# Patient Record
Sex: Male | Born: 1950 | Race: White | Hispanic: No | Marital: Married | Smoking: Never smoker
Health system: Southern US, Community
[De-identification: ages and names within clinical notes are randomized; demographics above are authoritative.]

## PROBLEM LIST (undated history)

## (undated) DIAGNOSIS — G4730 Sleep apnea, unspecified: Secondary | ICD-10-CM

## (undated) DIAGNOSIS — K219 Gastro-esophageal reflux disease without esophagitis: Secondary | ICD-10-CM

## (undated) DIAGNOSIS — M1990 Unspecified osteoarthritis, unspecified site: Secondary | ICD-10-CM

## (undated) DIAGNOSIS — E785 Hyperlipidemia, unspecified: Secondary | ICD-10-CM

## (undated) DIAGNOSIS — I2510 Atherosclerotic heart disease of native coronary artery without angina pectoris: Secondary | ICD-10-CM

## (undated) DIAGNOSIS — I10 Essential (primary) hypertension: Secondary | ICD-10-CM

## (undated) DIAGNOSIS — K222 Esophageal obstruction: Secondary | ICD-10-CM

## (undated) DIAGNOSIS — Z85828 Personal history of other malignant neoplasm of skin: Secondary | ICD-10-CM

## (undated) HISTORY — PX: UPPER GI ENDOSCOPY: SHX6162

## (undated) HISTORY — PX: COLONOSCOPY: SHX174

## (undated) HISTORY — PX: APPENDECTOMY: SHX54

## (undated) HISTORY — PX: VASECTOMY: SHX75

---

## 1971-12-24 HISTORY — PX: HERNIA REPAIR: SHX51

## 1983-12-24 HISTORY — PX: FRACTURE SURGERY: SHX138

## 2009-12-23 DIAGNOSIS — I2510 Atherosclerotic heart disease of native coronary artery without angina pectoris: Secondary | ICD-10-CM

## 2009-12-23 HISTORY — DX: Atherosclerotic heart disease of native coronary artery without angina pectoris: I25.10

## 2009-12-23 HISTORY — PX: CORONARY ANGIOPLASTY WITH STENT PLACEMENT: SHX49

## 2013-07-17 ENCOUNTER — Ambulatory Visit (INDEPENDENT_AMBULATORY_CARE_PROVIDER_SITE_OTHER): Payer: 59 | Admitting: Family Medicine

## 2013-07-17 VITALS — BP 110/75 | HR 96 | Temp 98.3°F | Resp 18 | Ht 73.0 in | Wt 197.0 lb

## 2013-07-17 DIAGNOSIS — M545 Low back pain: Secondary | ICD-10-CM

## 2013-07-17 DIAGNOSIS — R109 Unspecified abdominal pain: Secondary | ICD-10-CM

## 2013-07-17 DIAGNOSIS — R197 Diarrhea, unspecified: Secondary | ICD-10-CM

## 2013-07-17 LAB — POCT CBC
Granulocyte percent: 62.1 %G (ref 37–80)
HCT, POC: 50 % (ref 43.5–53.7)
Hemoglobin: 16.3 g/dL (ref 14.1–18.1)
POC Granulocyte: 4.2 (ref 2–6.9)
RBC: 5.27 M/uL (ref 4.69–6.13)

## 2013-07-17 LAB — POCT URINALYSIS DIPSTICK
Bilirubin, UA: NEGATIVE
Glucose, UA: NEGATIVE
Ketones, UA: NEGATIVE
Leukocytes, UA: NEGATIVE
Protein, UA: NEGATIVE

## 2013-07-17 MED ORDER — HYOSCYAMINE SULFATE ER 0.375 MG PO TB12: 0.3750 mg | ORAL_TABLET | Freq: Two times a day (BID) | ORAL | Status: DC | PRN

## 2013-07-17 MED ORDER — MELOXICAM 15 MG PO TABS: 15.0000 mg | ORAL_TABLET | Freq: Every day | ORAL | Status: DC

## 2013-07-17 NOTE — Patient Instructions (Addendum)
1.  CALL ON Monday IF NO IMPROVEMENT [phone]. 2.  TAKE IMODIUM ONE TABLET TWICE DAILY ONLY TO SLOW DOWN DIARRHEA.   Diarrhea Diarrhea is frequent loose and watery bowel movements. It can cause you to feel weak and dehydrated. Dehydration can cause you to become tired and thirsty, have a dry mouth, and have decreased urination that often is dark yellow. Diarrhea is a sign of another problem, most often an infection that will not last long. In most cases, diarrhea typically lasts 2 3 days. However, it can last longer if it is a sign of something more serious. It is important to treat your diarrhea as directed by your caregive to lessen or prevent future episodes of diarrhea. CAUSES  Some common causes include:  Gastrointestinal infections caused by viruses, bacteria, or parasites.  Food poisoning or food allergies.  Certain medicines, such as antibiotics, chemotherapy, and laxatives.  Artificial sweeteners and fructose.  Digestive disorders. HOME CARE INSTRUCTIONS  Ensure adequate fluid intake (hydration): have 1 cup (8 oz) of fluid for each diarrhea episode. Avoid fluids that contain simple sugars or sports drinks, fruit juices, whole milk products, and sodas. Your urine should be clear or pale yellow if you are drinking enough fluids. Hydrate with an oral rehydration solution that you can purchase at pharmacies, retail stores, and online. You can prepare an oral rehydration solution at home by mixing the following ingredients together:    tsp table salt.   tsp baking soda.   tsp salt substitute containing potassium chloride.  1  tablespoons sugar.  1 L (34 oz) of water.  Certain foods and beverages may increase the speed at which food moves through the gastrointestinal (GI) tract. These foods and beverages should be avoided and include:  Caffeinated and alcoholic beverages.  High-fiber foods, such as raw fruits and vegetables, nuts, seeds, and whole grain breads and  cereals.  Foods and beverages sweetened with sugar alcohols, such as xylitol, sorbitol, and mannitol.  Some foods may be well tolerated and may help thicken stool including:  Starchy foods, such as rice, toast, pasta, low-sugar cereal, oatmeal, grits, baked potatoes, crackers, and bagels.  Bananas.  Applesauce.  Add probiotic-rich foods to help increase healthy bacteria in the GI tract, such as yogurt and fermented milk products.  Wash your hands well after each diarrhea episode.  Only take over-the-counter or prescription medicines as directed by your caregiver.  Take a warm bath to relieve any burning or pain from frequent diarrhea episodes. SEEK IMMEDIATE MEDICAL CARE IF:   You are unable to keep fluids down.  You have persistent vomiting.  You have blood in your stool, or your stools are black and tarry.  You do not urinate in 6 8 hours, or there is only a small amount of very dark urine.  You have abdominal pain that increases or localizes.  You have weakness, dizziness, confusion, or lightheadedness.  You have a severe headache.  Your diarrhea gets worse or does not get better.  You have a fever or persistent symptoms for more than 2 3 days.  You have a fever and your symptoms suddenly get worse. MAKE SURE YOU:   Understand these instructions.  Will watch your condition.  Will get help right away if you are not doing well or get worse. Document Released: 11/29/2002 Document Revised: 11/25/2012 Document Reviewed: 08/16/2012 [facility] Patient Information 2014 [place], [place].

## 2013-07-17 NOTE — Progress Notes (Signed)
Subjective:    Patient ID: Jacob Parker, male    DOB: 12-08-1951, 62 y.o.   MRN: [id number]  HPI This 62 y.o. male presents for evaluation of diarrhea.   +fever Tmax unknown; +sweats; +chills.  HA; +diarrhea; +nausea; no vomiting.  Diarrhea small amounts 7-8; green stools per day; no melena; no bloody stools. + Watery.  No mucous in stools.  Cramping; no abdominal pain.  Friend got sick on trip before patient developed diarrhea.  +HA.Onset four days ago.  Drinking water; vitamin water, cranberry juice.  Soup yesterday; this morning, ate waffle.  Small volumes. Watermelon today.  Took one Imodium.  Tylenol for fever.  [place]  [place].  2. Lower back pain: chronic issue with recent worsening; no radiation into legs; no n/t/w; no saddle paresthesias; no b/b dysfunction.  No previous xrays; not interested in xrays today.   Review of Systems  Constitutional: Positive for chills, diaphoresis and fatigue. Negative for fever.  Gastrointestinal: Positive for nausea, abdominal pain and diarrhea. Negative for vomiting, constipation, blood in stool, abdominal distention, anal bleeding and rectal pain.  Musculoskeletal: Positive for myalgias and back pain.  Skin: Negative for rash.  Neurological: Positive for headaches. Negative for dizziness and light-headedness.   History reviewed. No pertinent past medical history. Past Surgical History  Procedure Laterality Date  . Appendectomy    . Fracture surgery    . Vasectomy     History   Social History  . Marital Status: Married    Spouse Name: N/A    Number of Children: N/A  . Years of Education: N/A   Occupational History  . Not on file.   Social History Main Topics  . Smoking status: Never Smoker   . Smokeless tobacco: Not on file  . Alcohol Use: 1.5 oz/week    3 drink(s) per week  . Drug Use: No  . Sexually Active: Yes   Other Topics Concern  . Not on file   Social History Narrative  . No narrative on file   No current  outpatient prescriptions on file prior to visit.   No current facility-administered medications on file prior to visit.       Objective:   Physical Exam  Constitutional: He is oriented to person, place, and time. He appears well-developed and well-nourished. No distress.  HENT:  Head: Normocephalic and atraumatic.  Mouth/Throat: Oropharynx is clear and moist.  Eyes: Conjunctivae are normal. Pupils are equal, round, and reactive to light.  Neck: Normal range of motion. Neck supple. No thyromegaly present.  Cardiovascular: Normal rate, regular rhythm and normal heart sounds.   Pulmonary/Chest: Effort normal and breath sounds normal.  Abdominal: Soft. Bowel sounds are normal. He exhibits no distension and no mass. There is no tenderness. There is no rebound and no guarding.  Musculoskeletal:       Lumbar back: He exhibits decreased range of motion, pain and spasm. He exhibits no tenderness.  Lymphadenopathy:    He has no cervical adenopathy.  Neurological: He is alert and oriented to person, place, and time.  Skin: Skin is warm and dry. No rash noted. He is not diaphoretic.  Psychiatric: He has a normal mood and affect. His behavior is normal.      Results for orders placed in visit on 07/17/13  POCT CBC      Result Value Range   WBC 6.8  4.6 - 10.2 K/uL   Lymph, poc 1.8  0.6 - 3.4   POC LYMPH PERCENT 26.3  10 - 50 %L   MID (cbc) 0.8  0 - 0.9   POC MID % 11.6  0 - 12 %M   POC Granulocyte 4.2  2 - 6.9   Granulocyte percent 62.1  37 - 80 %G   RBC 5.27  4.69 - 6.13 M/uL   Hemoglobin 16.3  14.1 - 18.1 g/dL   HCT, POC 16.1  09.6 - 53.7 %   MCV 94.9  80 - 97 fL   MCH, POC 30.9  27 - 31.2 pg   MCHC 32.6  31.8 - 35.4 g/dL   RDW, POC 04.5     Platelet Count, POC 283  142 - 424 K/uL   MPV 9.2  0 - 99.8 fL  POCT URINALYSIS DIPSTICK      Result Value Range   Color, UA yellow     Clarity, UA clear     Glucose, UA neg     Bilirubin, UA neg     Ketones, UA neg     Spec Grav, UA  1.015     Blood, UA neg     pH, UA 5.5     Protein, UA neg     Urobilinogen, UA 0.2     Nitrite, UA neg     Leukocytes, UA Negative      Assessment & Plan:  Diarrhea - Plan: POCT CBC, POCT urinalysis dipstick, Comprehensive metabolic panel  Abdominal  pain, other specified site - Plan: POCT CBC, POCT urinalysis dipstick, Comprehensive metabolic panel  Low back pain - Plan: POCT CBC, POCT urinalysis dipstick, Comprehensive metabolic panel   1. Diarrheal illness;  New.  BRAT diet, hydration.  RTC if diarrhea persists > 7 days; will warrant stool studies. Recommend imodium bid PRN. 2. Abdominal cramping:  New.  rx for Hyoscyamine provided for PRN use. 3. Low back pain: chronic issue; rx for Meloxicam provided.  Warrants xrays lumbar spine once diarrheal illness has resolved.  Meds ordered this encounter  Medications  . rosuvastatin (CRESTOR) 20 MG tablet    Sig: Take 20 mg by mouth daily.  Marland Kitchen lisinopril (PRINIVIL,ZESTRIL) 40 MG tablet    Sig: Take 40 mg by mouth daily.  Marland Kitchen esomeprazole (NEXIUM) 20 MG capsule    Sig: Take 20 mg by mouth daily before breakfast.  . aspirin 81 MG tablet    Sig: Take 81 mg by mouth daily.  . hyoscyamine (LEVBID) 0.375 MG 12 hr tablet    Sig: Take 1 tablet (0.375 mg total) by mouth every 12 (twelve) hours as needed for cramping.    Dispense:  30 tablet    Refill:  0  . meloxicam (MOBIC) 15 MG tablet    Sig: Take 1 tablet (15 mg total) by mouth daily.    Dispense:  30 tablet    Refill:  0

## 2013-07-18 LAB — COMPREHENSIVE METABOLIC PANEL
ALT: 45 U/L (ref 0–53)
CO2: 28 mEq/L (ref 19–32)
Chloride: 99 mEq/L (ref 96–112)
Sodium: 135 mEq/L (ref 135–145)
Total Bilirubin: 0.6 mg/dL (ref 0.3–1.2)
Total Protein: 7.6 g/dL (ref 6.0–8.3)

## 2014-10-27 ENCOUNTER — Other Ambulatory Visit: Payer: Self-pay | Admitting: Orthopedic Surgery

## 2014-11-04 ENCOUNTER — Other Ambulatory Visit (HOSPITAL_COMMUNITY): Payer: Self-pay | Admitting: *Deleted

## 2014-11-04 NOTE — Patient Instructions (Addendum)
Jacob Parker  11/04/2014                           YOUR PROCEDURE IS SCHEDULED ON:  11/16/14                ENTER FROM [address] - ENTER THRU EMERGENCY ENTRANCE                                             FOLLOW  SIGNS TO SHORT STAY CENTER                 ARRIVE AT SHORT STAY AT:  5:30 AM               CALL THIS NUMBER IF ANY PROBLEMS THE DAY OF SURGERY :               [phone]                                REMEMBER:   Do not eat food or drink liquids AFTER MIDNIGHT                  Take these medicines the morning of surgery with               A SIPS OF WATER :  NEXIUM / BYSTOLIC / ASPIRIN 81 MG (PER ORDER DR. TILLEY)       Do not wear jewelry, make-up   Do not wear lotions, powders, or perfumes.   Do not shave legs or underarms 12 hrs. before surgery (men may shave face)  Do not bring valuables to the hospital.  Contacts, dentures or bridgework may not be worn into surgery.  Leave suitcase in the car. After surgery it may be brought to your room.  For patients admitted to the hospital more than one night, checkout time is            11:00 AM                                                         ________________________________________________________________________                                                                                                  Jacob Parker  Before surgery, you can play an important role.  Because skin is not sterile, your skin needs to be as free of germs as possible.  You can reduce the number of germs on your skin by washing with CHG (chlorahexidine gluconate) soap before surgery.  CHG is an antiseptic cleaner which kills germs and bonds with the skin to continue killing germs even after washing. Please  DO NOT use if you have an allergy to CHG or antibacterial soaps.  If your skin becomes reddened/irritated stop using the CHG and inform your nurse when you arrive at Short Stay. Do not  shave (including legs and underarms) for at least 48 hours prior to the first CHG shower.  You may shave your face. Please follow these instructions carefully:   1.  Shower with CHG Soap the night before surgery and the  morning of Surgery.   2.  If you choose to wash your hair, wash your hair first as usual with your  normal  Shampoo.   3.  After you shampoo, rinse your hair and body thoroughly to remove the  shampoo.                                         4.  Use CHG as you would any other liquid soap.  You can apply chg directly  to the skin and wash . Gently wash with scrungie or clean wascloth    5.  Apply the CHG Soap to your body ONLY FROM THE NECK DOWN.   Do not use on open                           Wound or open sores. Avoid contact with eyes, ears mouth and genitals (private parts).                        Genitals (private parts) with your normal soap.              6.  Wash thoroughly, paying special attention to the area where your surgery  will be performed.   7.  Thoroughly rinse your body with warm water from the neck down.   8.  DO NOT shower/wash with your normal soap after using and rinsing off  the CHG Soap .                9.  Pat yourself dry with a clean towel.             10.  Wear clean pajamas.             11.  Place clean sheets on your bed the night of your first shower and do not  sleep with pets.  Day of Surgery : Do not apply any lotions/deodorants the morning of surgery.  Please wear clean clothes to the hospital/surgery center.  FAILURE TO FOLLOW THESE INSTRUCTIONS MAY RESULT IN THE CANCELLATION OF YOUR SURGERY    PATIENT SIGNATURE_________________________________  ______________________________________________________________________     Adam Phenix  An incentive spirometer is a tool that can help keep your lungs clear and active. This tool measures how well you are filling your lungs with each breath. Taking long deep breaths may  help reverse or decrease the chance of developing breathing (pulmonary) problems (especially infection) following:  A long period of time when you are unable to move or be active. BEFORE THE PROCEDURE   If the spirometer includes an indicator to show your best effort, your nurse or respiratory therapist will set it to a desired goal.  If possible, sit up straight or lean slightly forward. Try not to slouch.  Hold the incentive spirometer in an upright position. INSTRUCTIONS FOR USE  1. Sit on the  edge of your bed if possible, or sit up as far as you can in bed or on a chair. 2. Hold the incentive spirometer in an upright position. 3. Breathe out normally. 4. Place the mouthpiece in your mouth and seal your lips tightly around it. 5. Breathe in slowly and as deeply as possible, raising the piston or the ball toward the top of the column. 6. Hold your breath for 3-5 seconds or for as long as possible. Allow the piston or ball to fall to the bottom of the column. 7. Remove the mouthpiece from your mouth and breathe out normally. 8. Rest for a few seconds and repeat Steps 1 through 7 at least 10 times every 1-2 hours when you are awake. Take your time and take a few normal breaths between deep breaths. 9. The spirometer may include an indicator to show your best effort. Use the indicator as a goal to work toward during each repetition. 10. After each set of 10 deep breaths, practice coughing to be sure your lungs are clear. If you have an incision (the cut made at the time of surgery), support your incision when coughing by placing a pillow or rolled up towels firmly against it. Once you are able to get out of bed, walk around indoors and cough well. You may stop using the incentive spirometer when instructed by your caregiver.  RISKS AND COMPLICATIONS  Take your time so you do not get dizzy or light-headed.  If you are in pain, you may need to take or ask for pain medication before doing  incentive spirometry. It is harder to take a deep breath if you are having pain. AFTER USE  Rest and breathe slowly and easily.  It can be helpful to keep track of a log of your progress. Your caregiver can provide you with a simple table to help with this. If you are using the spirometer at home, follow these instructions: [place] IF:   You are having difficultly using the spirometer.  You have trouble using the spirometer as often as instructed.  Your pain medication is not giving enough relief while using the spirometer.  You develop fever of 100.5 F (38.1 C) or higher. SEEK IMMEDIATE MEDICAL CARE IF:   You cough up bloody sputum that had not been present before.  You develop fever of 102 F (38.9 C) or greater.  You develop worsening pain at or near the incision site. MAKE SURE YOU:   Understand these instructions.  Will watch your condition.  Will get help right away if you are not doing well or get worse. Document Released: 04/21/2007 Document Revised: 03/02/2012 Document Reviewed: 06/22/2007 [facility] Patient Information 2014 [place], [place].   ________________________________________________________________________

## 2014-11-08 ENCOUNTER — Encounter (HOSPITAL_COMMUNITY): Payer: Self-pay

## 2014-11-08 ENCOUNTER — Encounter (HOSPITAL_COMMUNITY)
Admission: RE | Admit: 2014-11-08 | Discharge: 2014-11-08 | Disposition: A | Discharge status: Home / Self Care | Payer: Medicare HMO | Source: Ambulatory Visit | Attending: Orthopedic Surgery | Admitting: Orthopedic Surgery

## 2014-11-08 ENCOUNTER — Ambulatory Visit (HOSPITAL_COMMUNITY)
Admission: RE | Admit: 2014-11-08 | Discharge: 2014-11-08 | Disposition: A | Discharge status: Home / Self Care | Payer: Medicare HMO | Source: Ambulatory Visit | Attending: Orthopedic Surgery | Admitting: Orthopedic Surgery

## 2014-11-08 DIAGNOSIS — I2510 Atherosclerotic heart disease of native coronary artery without angina pectoris: Secondary | ICD-10-CM | POA: Insufficient documentation

## 2014-11-08 DIAGNOSIS — I700 Atherosclerosis of aorta: Secondary | ICD-10-CM | POA: Insufficient documentation

## 2014-11-08 DIAGNOSIS — Z01811 Encounter for preprocedural respiratory examination: Secondary | ICD-10-CM | POA: Insufficient documentation

## 2014-11-08 DIAGNOSIS — Z01818 Encounter for other preprocedural examination: Secondary | ICD-10-CM

## 2014-11-08 HISTORY — DX: Essential (primary) hypertension: I10

## 2014-11-08 HISTORY — DX: Unspecified osteoarthritis, unspecified site: M19.90

## 2014-11-08 HISTORY — DX: Esophageal obstruction: K22.2

## 2014-11-08 HISTORY — DX: Sleep apnea, unspecified: G47.30

## 2014-11-08 HISTORY — DX: Personal history of other malignant neoplasm of skin: Z85.828

## 2014-11-08 HISTORY — DX: Atherosclerotic heart disease of native coronary artery without angina pectoris: I25.10

## 2014-11-08 HISTORY — DX: Hyperlipidemia, unspecified: E78.5

## 2014-11-08 LAB — COMPREHENSIVE METABOLIC PANEL
ALBUMIN: 3.9 g/dL (ref 3.5–5.2)
ALK PHOS: 91 U/L (ref 39–117)
ALT: 64 U/L — AB (ref 0–53)
ANION GAP: 11 (ref 5–15)
AST: 37 U/L (ref 0–37)
BILIRUBIN TOTAL: 0.7 mg/dL (ref 0.3–1.2)
BUN: 14 mg/dL (ref 6–23)
CHLORIDE: 101 meq/L (ref 96–112)
CO2: 29 mEq/L (ref 19–32)
Calcium: 9.7 mg/dL (ref 8.4–10.5)
Creatinine, Ser: 1.02 mg/dL (ref 0.50–1.35)
GFR calc Af Amer: 87 mL/min — ABNORMAL LOW (ref >90)
GFR calc non Af Amer: 75 mL/min — ABNORMAL LOW (ref >90)
Glucose, Bld: 93 mg/dL (ref 70–99)
POTASSIUM: 4.9 meq/L (ref 3.7–5.3)
SODIUM: 141 meq/L (ref 137–147)
TOTAL PROTEIN: 7.4 g/dL (ref 6.0–8.3)

## 2014-11-08 LAB — CBC WITH DIFFERENTIAL/PLATELET
BASOS PCT: 1 % (ref 0–1)
Basophils Absolute: 0.1 10*3/uL (ref 0.0–0.1)
Eosinophils Absolute: 0.5 10*3/uL (ref 0.0–0.7)
Eosinophils Relative: 8 % — ABNORMAL HIGH (ref 0–5)
HCT: 44 % (ref 39.0–52.0)
Hemoglobin: 14.8 g/dL (ref 13.0–17.0)
Lymphocytes Relative: 31 % (ref 12–46)
Lymphs Abs: 1.7 10*3/uL (ref 0.7–4.0)
MCH: 30.1 pg (ref 26.0–34.0)
MCHC: 33.6 g/dL (ref 30.0–36.0)
MCV: 89.4 fL (ref 78.0–100.0)
Monocytes Absolute: 0.5 10*3/uL (ref 0.1–1.0)
Monocytes Relative: 9 % (ref 3–12)
NEUTROS PCT: 51 % (ref 43–77)
Neutro Abs: 2.8 10*3/uL (ref 1.7–7.7)
PLATELETS: 202 10*3/uL (ref 150–400)
RBC: 4.92 MIL/uL (ref 4.22–5.81)
RDW: 12.8 % (ref 11.5–15.5)
WBC: 5.5 10*3/uL (ref 4.0–10.5)

## 2014-11-08 LAB — URINALYSIS, ROUTINE W REFLEX MICROSCOPIC
Bilirubin Urine: NEGATIVE
Glucose, UA: NEGATIVE mg/dL
Hgb urine dipstick: NEGATIVE
Ketones, ur: NEGATIVE mg/dL
LEUKOCYTES UA: NEGATIVE
NITRITE: NEGATIVE
Protein, ur: NEGATIVE mg/dL
SPECIFIC GRAVITY, URINE: 1.023 (ref 1.005–1.030)
Urobilinogen, UA: 0.2 mg/dL (ref 0.0–1.0)
pH: 6 (ref 5.0–8.0)

## 2014-11-08 LAB — SURGICAL PCR SCREEN
MRSA, PCR: INVALID — AB
Staphylococcus aureus: INVALID — AB

## 2014-11-08 LAB — ABO/RH: ABO/RH(D): A NEG

## 2014-11-08 LAB — PROTIME-INR
INR: 0.98 (ref 0.00–1.49)
Prothrombin Time: 13.1 seconds (ref 11.6–15.2)

## 2014-11-08 LAB — APTT: APTT: 28 s (ref 24–37)

## 2014-11-10 LAB — MRSA CULTURE

## 2014-11-15 NOTE — Anesthesia Preprocedure Evaluation (Signed)
Anesthesia Evaluation  Patient identified by MRN, date of birth, ID band Patient awake    Reviewed: Allergy & Precautions, H&P , NPO status , Patient's Chart, lab work & pertinent test results, reviewed documented beta blocker date and time   Airway Mallampati: II  TM Distance: >3 FB Neck ROM: Full    Dental no notable dental hx.    Pulmonary neg pulmonary ROS,  breath sounds clear to auscultation  Pulmonary exam normal       Cardiovascular hypertension, Pt. on medications and Pt. on home beta blockers + CAD and + Cardiac Stents Rhythm:Regular Rate:Normal     Neuro/Psych negative neurological ROS  negative psych ROS   GI/Hepatic negative GI ROS, Neg liver ROS,   Endo/Other  negative endocrine ROS  Renal/GU negative Renal ROS     Musculoskeletal  (+) Arthritis -,   Abdominal   Peds  Hematology negative hematology ROS (+)   Anesthesia Other Findings   Reproductive/Obstetrics negative OB ROS                             Anesthesia Physical Anesthesia Plan  ASA: III  Anesthesia Plan: Spinal   Post-op Pain Management:    Induction:   Airway Management Planned:   Additional Equipment: None  Intra-op Plan:   Post-operative Plan:   Informed Consent: I have reviewed the patients History and Physical, chart, labs and discussed the procedure including the risks, benefits and alternatives for the proposed anesthesia with the patient or authorized representative who has indicated his/her understanding and acceptance.   Dental advisory given  Plan Discussed with: CRNA  Anesthesia Plan Comments:         Anesthesia Quick Evaluation

## 2014-11-16 ENCOUNTER — Encounter (HOSPITAL_COMMUNITY)
Admission: RE | Disposition: A | Discharge status: Skilled Nursing Facility | Payer: Self-pay | Source: Ambulatory Visit | Attending: Orthopedic Surgery

## 2014-11-16 ENCOUNTER — Inpatient Hospital Stay (HOSPITAL_COMMUNITY)
Admission: RE | Admit: 2014-11-16 | Discharge: 2014-11-18 | DRG: 470 | Disposition: A | Discharge status: Skilled Nursing Facility | Payer: Medicare HMO | Source: Ambulatory Visit | Attending: Orthopedic Surgery | Admitting: Orthopedic Surgery

## 2014-11-16 ENCOUNTER — Encounter (HOSPITAL_COMMUNITY): Payer: Self-pay | Admitting: *Deleted

## 2014-11-16 ENCOUNTER — Inpatient Hospital Stay (HOSPITAL_COMMUNITY): Payer: Medicare HMO | Admitting: Anesthesiology

## 2014-11-16 DIAGNOSIS — M1712 Unilateral primary osteoarthritis, left knee: Secondary | ICD-10-CM

## 2014-11-16 DIAGNOSIS — Z85828 Personal history of other malignant neoplasm of skin: Secondary | ICD-10-CM

## 2014-11-16 DIAGNOSIS — Z7982 Long term (current) use of aspirin: Secondary | ICD-10-CM

## 2014-11-16 DIAGNOSIS — Z79899 Other long term (current) drug therapy: Secondary | ICD-10-CM

## 2014-11-16 DIAGNOSIS — E785 Hyperlipidemia, unspecified: Secondary | ICD-10-CM | POA: Diagnosis present

## 2014-11-16 DIAGNOSIS — Z955 Presence of coronary angioplasty implant and graft: Secondary | ICD-10-CM | POA: Diagnosis not present

## 2014-11-16 DIAGNOSIS — I2510 Atherosclerotic heart disease of native coronary artery without angina pectoris: Secondary | ICD-10-CM | POA: Diagnosis present

## 2014-11-16 DIAGNOSIS — I10 Essential (primary) hypertension: Secondary | ICD-10-CM | POA: Diagnosis present

## 2014-11-16 DIAGNOSIS — M25562 Pain in left knee: Secondary | ICD-10-CM | POA: Diagnosis present

## 2014-11-16 DIAGNOSIS — G4730 Sleep apnea, unspecified: Secondary | ICD-10-CM | POA: Diagnosis present

## 2014-11-16 HISTORY — PX: TOTAL KNEE ARTHROPLASTY: SHX125

## 2014-11-16 HISTORY — DX: Unilateral primary osteoarthritis, left knee: M17.12

## 2014-11-16 LAB — TYPE AND SCREEN
ABO/RH(D): A NEG
Antibody Screen: NEGATIVE

## 2014-11-16 SURGERY — ARTHROPLASTY, KNEE, TOTAL
Anesthesia: Spinal | Site: Knee | Laterality: Left

## 2014-11-16 MED ORDER — PROPOFOL 10 MG/ML IV BOLUS
INTRAVENOUS | Status: AC
  Filled 2014-11-16: qty 20

## 2014-11-16 MED ORDER — HYDROMORPHONE HCL 1 MG/ML IJ SOLN
0.2500 mg | INTRAMUSCULAR | Status: DC | PRN
  Administered 2014-11-16 (×4): 0.5 mg via INTRAVENOUS

## 2014-11-16 MED ORDER — STERILE WATER FOR IRRIGATION IR SOLN
Status: DC | PRN
  Administered 2014-11-16: 1500 mL

## 2014-11-16 MED ORDER — MIDAZOLAM HCL 2 MG/2ML IJ SOLN
INTRAMUSCULAR | Status: AC
  Filled 2014-11-16: qty 2

## 2014-11-16 MED ORDER — LIDOCAINE HCL (CARDIAC) 20 MG/ML IV SOLN
INTRAVENOUS | Status: DC | PRN
  Administered 2014-11-16: 50 mg via INTRAVENOUS

## 2014-11-16 MED ORDER — CEFAZOLIN SODIUM-DEXTROSE 2-3 GM-% IV SOLR
2.0000 g | Freq: Four times a day (QID) | INTRAVENOUS | Status: AC
  Administered 2014-11-16 (×2): 2 g via INTRAVENOUS
  Filled 2014-11-16 (×2): qty 50

## 2014-11-16 MED ORDER — PROMETHAZINE HCL 25 MG/ML IJ SOLN
6.2500 mg | Freq: Four times a day (QID) | INTRAMUSCULAR | Status: DC | PRN
  Administered 2014-11-16 – 2014-11-17 (×2): 6.25 mg via INTRAVENOUS
  Filled 2014-11-16 (×2): qty 1

## 2014-11-16 MED ORDER — METHOCARBAMOL 500 MG PO TABS: 500.0000 mg | ORAL_TABLET | Freq: Three times a day (TID) | ORAL | Status: DC | PRN

## 2014-11-16 MED ORDER — ASPIRIN EC 325 MG PO TBEC: 325.0000 mg | DELAYED_RELEASE_TABLET | Freq: Two times a day (BID) | ORAL | Status: DC

## 2014-11-16 MED ORDER — HYDROMORPHONE HCL 1 MG/ML IJ SOLN
INTRAMUSCULAR | Status: AC
  Administered 2014-11-16: 1 mg via INTRAVENOUS
  Filled 2014-11-16: qty 1

## 2014-11-16 MED ORDER — ONDANSETRON HCL 4 MG/2ML IJ SOLN
INTRAMUSCULAR | Status: DC | PRN
  Administered 2014-11-16: 4 mg via INTRAVENOUS

## 2014-11-16 MED ORDER — LIDOCAINE HCL (CARDIAC) 20 MG/ML IV SOLN
INTRAVENOUS | Status: AC
  Filled 2014-11-16: qty 5

## 2014-11-16 MED ORDER — PANTOPRAZOLE SODIUM 40 MG PO TBEC
80.0000 mg | DELAYED_RELEASE_TABLET | Freq: Every day | ORAL | Status: DC
  Administered 2014-11-17 – 2014-11-18 (×2): 80 mg via ORAL
  Filled 2014-11-16 (×2): qty 2

## 2014-11-16 MED ORDER — ASPIRIN EC 325 MG PO TBEC
325.0000 mg | DELAYED_RELEASE_TABLET | Freq: Two times a day (BID) | ORAL | Status: DC
  Administered 2014-11-16 – 2014-11-18 (×4): 325 mg via ORAL
  Filled 2014-11-16 (×7): qty 1

## 2014-11-16 MED ORDER — ONDANSETRON HCL 4 MG PO TABS: 4.0000 mg | ORAL_TABLET | Freq: Four times a day (QID) | ORAL | Status: DC | PRN

## 2014-11-16 MED ORDER — PROPOFOL INFUSION 10 MG/ML OPTIME
INTRAVENOUS | Status: DC | PRN
  Administered 2014-11-16: 140 ug/kg/min via INTRAVENOUS

## 2014-11-16 MED ORDER — OXYCODONE HCL 5 MG/5ML PO SOLN
5.0000 mg | Freq: Once | ORAL | Status: AC | PRN
  Filled 2014-11-16: qty 5

## 2014-11-16 MED ORDER — PROPOFOL 10 MG/ML IV BOLUS
INTRAVENOUS | Status: DC | PRN
  Administered 2014-11-16: 10 mg via INTRAVENOUS
  Administered 2014-11-16: 20 mg via INTRAVENOUS

## 2014-11-16 MED ORDER — ROSUVASTATIN CALCIUM 40 MG PO TABS
40.0000 mg | ORAL_TABLET | Freq: Every evening | ORAL | Status: DC
  Administered 2014-11-16 – 2014-11-17 (×2): 40 mg via ORAL
  Filled 2014-11-16 (×3): qty 1

## 2014-11-16 MED ORDER — PHENYLEPHRINE 40 MCG/ML (10ML) SYRINGE FOR IV PUSH (FOR BLOOD PRESSURE SUPPORT)
PREFILLED_SYRINGE | INTRAVENOUS | Status: AC
  Filled 2014-11-16: qty 10

## 2014-11-16 MED ORDER — BUPIVACAINE IN DEXTROSE 0.75-8.25 % IT SOLN
INTRATHECAL | Status: DC | PRN
  Administered 2014-11-16: 15 mg via INTRATHECAL

## 2014-11-16 MED ORDER — LACTATED RINGERS IV SOLN
INTRAVENOUS | Status: DC | PRN
  Administered 2014-11-16 (×3): via INTRAVENOUS

## 2014-11-16 MED ORDER — ZOLPIDEM TARTRATE 5 MG PO TABS
5.0000 mg | ORAL_TABLET | Freq: Every evening | ORAL | Status: DC | PRN
  Administered 2014-11-17 – 2014-11-18 (×2): 5 mg via ORAL
  Filled 2014-11-16 (×2): qty 1

## 2014-11-16 MED ORDER — ONDANSETRON HCL 4 MG/2ML IJ SOLN
4.0000 mg | Freq: Four times a day (QID) | INTRAMUSCULAR | Status: DC | PRN
  Administered 2014-11-16 – 2014-11-17 (×2): 4 mg via INTRAVENOUS
  Filled 2014-11-16 (×2): qty 2

## 2014-11-16 MED ORDER — KETAMINE HCL 10 MG/ML IJ SOLN
INTRAMUSCULAR | Status: AC
  Filled 2014-11-16: qty 1

## 2014-11-16 MED ORDER — FLEET ENEMA 7-19 GM/118ML RE ENEM: 1.0000 | ENEMA | Freq: Once | RECTAL | Status: AC | PRN

## 2014-11-16 MED ORDER — BUPIVACAINE-EPINEPHRINE 0.5% -1:200000 IJ SOLN
INTRAMUSCULAR | Status: DC | PRN
  Administered 2014-11-16: 20 mL

## 2014-11-16 MED ORDER — CEFUROXIME SODIUM 1.5 G IJ SOLR
INTRAMUSCULAR | Status: AC
  Filled 2014-11-16: qty 1.5

## 2014-11-16 MED ORDER — ACETAMINOPHEN 325 MG PO TABS: 650.0000 mg | ORAL_TABLET | Freq: Four times a day (QID) | ORAL | Status: DC | PRN

## 2014-11-16 MED ORDER — NEBIVOLOL HCL 5 MG PO TABS
5.0000 mg | ORAL_TABLET | Freq: Every morning | ORAL | Status: DC
Start: 2014-11-17 — End: 2014-11-18
  Administered 2014-11-17 – 2014-11-18 (×2): 5 mg via ORAL
  Filled 2014-11-16 (×2): qty 1

## 2014-11-16 MED ORDER — 0.9 % SODIUM CHLORIDE (POUR BTL) OPTIME
TOPICAL | Status: DC | PRN
  Administered 2014-11-16: 1000 mL

## 2014-11-16 MED ORDER — ALUM & MAG HYDROXIDE-SIMETH 200-200-20 MG/5ML PO SUSP: 30.0000 mL | ORAL | Status: DC | PRN

## 2014-11-16 MED ORDER — MEPERIDINE HCL 50 MG/ML IJ SOLN: 6.2500 mg | INTRAMUSCULAR | Status: DC | PRN

## 2014-11-16 MED ORDER — FENTANYL CITRATE 0.05 MG/ML IJ SOLN
INTRAMUSCULAR | Status: DC | PRN
  Administered 2014-11-16: 100 ug via INTRAVENOUS

## 2014-11-16 MED ORDER — METHOCARBAMOL 500 MG PO TABS
500.0000 mg | ORAL_TABLET | Freq: Four times a day (QID) | ORAL | Status: DC | PRN
  Administered 2014-11-16 – 2014-11-18 (×5): 500 mg via ORAL
  Filled 2014-11-16 (×6): qty 1

## 2014-11-16 MED ORDER — SODIUM CHLORIDE 0.9 % IR SOLN
Status: DC | PRN
Start: 2014-11-16 — End: 2014-11-16
  Administered 2014-11-16: 1000 mL

## 2014-11-16 MED ORDER — TRANEXAMIC ACID 100 MG/ML IV SOLN
1000.0000 mg | INTRAVENOUS | Status: AC
  Administered 2014-11-16: 1000 mg via INTRAVENOUS
  Filled 2014-11-16: qty 10

## 2014-11-16 MED ORDER — BUPIVACAINE LIPOSOME 1.3 % IJ SUSP
20.0000 mL | Freq: Once | INTRAMUSCULAR | Status: AC
  Administered 2014-11-16: 20 mL
  Filled 2014-11-16: qty 20

## 2014-11-16 MED ORDER — METHOCARBAMOL 1000 MG/10ML IJ SOLN
500.0000 mg | Freq: Four times a day (QID) | INTRAVENOUS | Status: DC | PRN
  Administered 2014-11-16: 500 mg via INTRAVENOUS
  Filled 2014-11-16 (×2): qty 5

## 2014-11-16 MED ORDER — CEFAZOLIN SODIUM-DEXTROSE 2-3 GM-% IV SOLR
INTRAVENOUS | Status: AC
  Filled 2014-11-16: qty 50

## 2014-11-16 MED ORDER — OXYCODONE HCL 5 MG PO TABS
ORAL_TABLET | ORAL | Status: AC
  Filled 2014-11-16: qty 1

## 2014-11-16 MED ORDER — POLYETHYLENE GLYCOL 3350 17 G PO PACK: 17.0000 g | PACK | Freq: Every day | ORAL | Status: DC | PRN

## 2014-11-16 MED ORDER — ACETAMINOPHEN 650 MG RE SUPP: 650.0000 mg | Freq: Four times a day (QID) | RECTAL | Status: DC | PRN

## 2014-11-16 MED ORDER — HYDROMORPHONE HCL 1 MG/ML IJ SOLN
0.5000 mg | INTRAMUSCULAR | Status: DC | PRN
  Administered 2014-11-16 – 2014-11-18 (×11): 1 mg via INTRAVENOUS
  Filled 2014-11-16 (×10): qty 1

## 2014-11-16 MED ORDER — OXYCODONE-ACETAMINOPHEN 5-325 MG PO TABS: 1.0000 | ORAL_TABLET | Freq: Four times a day (QID) | ORAL | Status: DC | PRN

## 2014-11-16 MED ORDER — SODIUM CHLORIDE 0.9 % IV SOLN
INTRAVENOUS | Status: DC
  Administered 2014-11-16: 100 mL/h via INTRAVENOUS
  Administered 2014-11-17: 15:00:00 via INTRAVENOUS

## 2014-11-16 MED ORDER — DIPHENHYDRAMINE HCL 12.5 MG/5ML PO ELIX: 12.5000 mg | ORAL_SOLUTION | ORAL | Status: DC | PRN

## 2014-11-16 MED ORDER — BUPIVACAINE-EPINEPHRINE 0.5% -1:200000 IJ SOLN
INTRAMUSCULAR | Status: AC
  Filled 2014-11-16: qty 1

## 2014-11-16 MED ORDER — PROMETHAZINE HCL 25 MG/ML IJ SOLN: 6.2500 mg | INTRAMUSCULAR | Status: DC | PRN

## 2014-11-16 MED ORDER — CEFAZOLIN SODIUM-DEXTROSE 2-3 GM-% IV SOLR
2.0000 g | INTRAVENOUS | Status: AC
Start: 2014-11-16 — End: 2014-11-16
  Administered 2014-11-16: 2 g via INTRAVENOUS

## 2014-11-16 MED ORDER — LISINOPRIL 20 MG PO TABS
20.0000 mg | ORAL_TABLET | Freq: Every day | ORAL | Status: DC
  Administered 2014-11-16 – 2014-11-17 (×2): 20 mg via ORAL
  Filled 2014-11-16 (×3): qty 1

## 2014-11-16 MED ORDER — OXYCODONE-ACETAMINOPHEN 5-325 MG PO TABS
1.0000 | ORAL_TABLET | ORAL | Status: DC | PRN
  Administered 2014-11-16 – 2014-11-18 (×11): 2 via ORAL
  Filled 2014-11-16 (×11): qty 2

## 2014-11-16 MED ORDER — DEXAMETHASONE SODIUM PHOSPHATE 10 MG/ML IJ SOLN
INTRAMUSCULAR | Status: AC
  Filled 2014-11-16: qty 1

## 2014-11-16 MED ORDER — MIDAZOLAM HCL 5 MG/5ML IJ SOLN
INTRAMUSCULAR | Status: DC | PRN
  Administered 2014-11-16: 1 mg via INTRAVENOUS
  Administered 2014-11-16: 2 mg via INTRAVENOUS
  Administered 2014-11-16: 1 mg via INTRAVENOUS

## 2014-11-16 MED ORDER — CHLORHEXIDINE GLUCONATE 4 % EX LIQD: 60.0000 mL | Freq: Once | CUTANEOUS | Status: DC

## 2014-11-16 MED ORDER — FENTANYL CITRATE 0.05 MG/ML IJ SOLN
INTRAMUSCULAR | Status: AC
  Filled 2014-11-16: qty 2

## 2014-11-16 MED ORDER — BISACODYL 5 MG PO TBEC: 5.0000 mg | DELAYED_RELEASE_TABLET | Freq: Every day | ORAL | Status: DC | PRN

## 2014-11-16 MED ORDER — DOCUSATE SODIUM 100 MG PO CAPS
100.0000 mg | ORAL_CAPSULE | Freq: Two times a day (BID) | ORAL | Status: DC
  Administered 2014-11-16 – 2014-11-18 (×4): 100 mg via ORAL

## 2014-11-16 MED ORDER — PHENYLEPHRINE HCL 10 MG/ML IJ SOLN
INTRAMUSCULAR | Status: DC | PRN
  Administered 2014-11-16 (×2): 60 ug via INTRAVENOUS

## 2014-11-16 MED ORDER — OXYCODONE HCL 5 MG PO TABS
5.0000 mg | ORAL_TABLET | Freq: Once | ORAL | Status: AC | PRN
  Administered 2014-11-16: 5 mg via ORAL

## 2014-11-16 SURGICAL SUPPLY — 49 items
BAG ZIPLOCK 12X15 (MISCELLANEOUS) ×3 IMPLANT
BANDAGE ELASTIC 4 VELCRO ST LF (GAUZE/BANDAGES/DRESSINGS) ×3 IMPLANT
BANDAGE ELASTIC 6 VELCRO ST LF (GAUZE/BANDAGES/DRESSINGS) ×3 IMPLANT
BANDAGE ESMARK 6X9 LF (GAUZE/BANDAGES/DRESSINGS) ×1 IMPLANT
BENZOIN TINCTURE PRP APPL 2/3 (GAUZE/BANDAGES/DRESSINGS) ×3 IMPLANT
BLADE SAG 18X100X1.27 (BLADE) ×6 IMPLANT
BLADE SAW SGTL 13.0X1.19X90.0M (BLADE) ×3 IMPLANT
BNDG CONFORM 6X.82 1P STRL (GAUZE/BANDAGES/DRESSINGS) ×3 IMPLANT
BNDG ESMARK 6X9 LF (GAUZE/BANDAGES/DRESSINGS) ×3
BOOTIES KNEE HIGH SLOAN (MISCELLANEOUS) ×3 IMPLANT
BOWL SMART MIX CTS (DISPOSABLE) ×3 IMPLANT
CAPT RP KNEE ×3 IMPLANT
CEMENT HV SMART SET (Cement) ×6 IMPLANT
COVER SURGICAL LIGHT HANDLE (MISCELLANEOUS) ×6 IMPLANT
DRAPE EXTREMITY T 121X128X90 (DRAPE) ×3 IMPLANT
DRAPE POUCH INSTRU U-SHP 10X18 (DRAPES) ×3 IMPLANT
DRAPE U-SHAPE 47X51 STRL (DRAPES) ×3 IMPLANT
DRSG ADAPTIC 3X8 NADH LF (GAUZE/BANDAGES/DRESSINGS) ×3 IMPLANT
DRSG PAD ABDOMINAL 8X10 ST (GAUZE/BANDAGES/DRESSINGS) ×3 IMPLANT
ELECT REM PT RETURN 9FT ADLT (ELECTROSURGICAL) ×3
ELECTRODE REM PT RTRN 9FT ADLT (ELECTROSURGICAL) ×1 IMPLANT
EVACUATOR 1/8 PVC DRAIN (DRAIN) ×3 IMPLANT
FACESHIELD WRAPAROUND (MASK) ×9 IMPLANT
GAUZE SPONGE 4X4 12PLY STRL (GAUZE/BANDAGES/DRESSINGS) ×3 IMPLANT
GLOVE BIO SURGEON STRL SZ8 (GLOVE) ×3 IMPLANT
GLOVE ECLIPSE 7.5 STRL STRAW (GLOVE) ×3 IMPLANT
HANDPIECE INTERPULSE COAX TIP (DISPOSABLE) ×2
HOOD PEEL AWAY FACE SHEILD DIS (HOOD) ×6 IMPLANT
IMMOBILIZER KNEE 20 (SOFTGOODS) ×3 IMPLANT
IMMOBILIZER KNEE 20 THIGH 36 (SOFTGOODS) IMPLANT
KIT BASIN OR (CUSTOM PROCEDURE TRAY) ×3 IMPLANT
MANIFOLD NEPTUNE II (INSTRUMENTS) ×3 IMPLANT
NS IRRIG 1000ML POUR BTL (IV SOLUTION) ×3 IMPLANT
PACK TOTAL JOINT (CUSTOM PROCEDURE TRAY) ×3 IMPLANT
PADDING CAST COTTON 6X4 STRL (CAST SUPPLIES) ×6 IMPLANT
POSITIONER SURGICAL ARM (MISCELLANEOUS) ×3 IMPLANT
SET HNDPC FAN SPRY TIP SCT (DISPOSABLE) ×1 IMPLANT
STAPLER VISISTAT 35W (STAPLE) IMPLANT
SUT MNCRL AB 3-0 PS2 18 (SUTURE) ×3 IMPLANT
SUT VIC AB 0 CT1 27 (SUTURE) ×4
SUT VIC AB 0 CT1 27XBRD ANTBC (SUTURE) ×2 IMPLANT
SUT VIC AB 1 CT1 27 (SUTURE) ×4
SUT VIC AB 1 CT1 27XBRD ANTBC (SUTURE) ×2 IMPLANT
SUT VIC AB 2-0 CT1 27 (SUTURE) ×2
SUT VIC AB 2-0 CT1 TAPERPNT 27 (SUTURE) ×1 IMPLANT
TAPE STRIPS DRAPE STRL (GAUZE/BANDAGES/DRESSINGS) ×3 IMPLANT
TOWEL OR 17X26 10 PK STRL BLUE (TOWEL DISPOSABLE) ×3 IMPLANT
TRAY FOLEY CATH 14FRSI W/METER (CATHETERS) IMPLANT
WATER STERILE IRR 1500ML POUR (IV SOLUTION) ×3 IMPLANT

## 2014-11-16 NOTE — Evaluation (Signed)
Physical Therapy Evaluation Patient Details Name: Jacob Parker MRN: [id number] DOB: 03-17-1951 Today's Date: 11/16/2014   History of Present Illness     Clinical Impression  Pt s/p L TKR presents with decreased L LE strength/ROM and post op pain limiting functional mobility.  Pt plans d.c to rehab at SNF level stating he does not have 24/7 assist at home.  Pt motivated and should progress well.    Follow Up Recommendations SNF    Equipment Recommendations  Rolling walker with 5" wheels    Recommendations for Other Services OT consult     Precautions / Restrictions Precautions Precautions: Fall Restrictions Weight Bearing Restrictions: No      Mobility  Bed Mobility Overal bed mobility: Needs Assistance Bed Mobility: Supine to Sit     Supine to sit: Mod assist     General bed mobility comments: cues for sequence and use of R LE to self assist  Transfers Overall transfer level: Needs assistance Equipment used: Rolling walker (2 wheeled) Transfers: Sit to/from Stand Sit to Stand: Min assist;Mod assist         General transfer comment: cues for LE management and use of UEs to self assist  Ambulation/Gait Ambulation/Gait assistance: Min assist;Mod assist Ambulation Distance (Feet): 36 Feet Assistive device: Rolling walker (2 wheeled) Gait Pattern/deviations: Step-to pattern;Shuffle;Decreased step length - right;Decreased step length - left;Trunk flexed Gait velocity: decr   General Gait Details: cues for sequence, posture and position from [employer]            Wheelchair Mobility    Modified Rankin (Stroke Patients Only)       Balance                                             Pertinent Vitals/Pain Pain Assessment: 0-10 Pain Score: 5  Pain Location: L knee Pain Descriptors / Indicators: Sore Pain Intervention(s): Limited activity within patient's tolerance;Monitored during session;Premedicated before session;Ice  applied    Home Living Family/patient expects to be discharged to:: Skilled nursing facility Living Arrangements: Spouse/significant other               Additional Comments: Pt states does not have 24/7 assist and must contend with 2nd floor bedroom.    Prior Function Level of Independence: Independent               Hand Dominance   Dominant Hand: Right    Extremity/Trunk Assessment   Upper Extremity Assessment: Overall WFL for tasks assessed           Lower Extremity Assessment: LLE deficits/detail   LLE Deficits / Details: 2-/5 quads; ankle immobilized 2* surgical rod placement  Cervical / Trunk Assessment: Normal  Communication   Communication: No difficulties  Cognition Arousal/Alertness: Awake/alert Behavior During Therapy: WFL for tasks assessed/performed Overall Cognitive Status: Within Functional Limits for tasks assessed                      General Comments      Exercises Total Joint Exercises Ankle Circles/Pumps: AROM;Right;10 reps;Supine Quad Sets: AROM;Both;5 reps;Supine Straight Leg Raises: AAROM;Left;5 reps;Supine      Assessment/Plan    PT Assessment Patient needs continued PT services  PT Diagnosis Difficulty walking   PT Problem List Decreased strength;Decreased range of motion;Decreased activity tolerance;Decreased mobility;Decreased knowledge of use of DME;Pain  PT Treatment Interventions DME  instruction;Gait training;Stair training;Functional mobility training;Therapeutic activities;Therapeutic exercise;Patient/family education   PT Goals (Current goals can be found in the Care Plan section) Acute Rehab PT Goals Patient Stated Goal: Resume previous lifestyle with decreased pain PT Goal Formulation: With patient Time For Goal Achievement: 11/23/14 Potential to Achieve Goals: Good    Frequency 7X/week   Barriers to discharge        Co-evaluation               End of Session Equipment Utilized During  Treatment: Gait belt;Left knee immobilizer Activity Tolerance: Patient tolerated treatment well;Other (comment) (nausea) Patient left: in bed;with call bell/phone within reach Nurse Communication: Mobility status         Time: 6295-2841 PT Time Calculation (min) (ACUTE ONLY): 28 min   Charges:   PT Evaluation $Initial PT Evaluation Tier I: 1 Procedure PT Treatments $Gait Training: 8-22 mins   PT G Codes:          Lalitha Ilyas 11/16/2014, 6:21 PM

## 2014-11-16 NOTE — Discharge Instructions (Signed)
Total Knee Replacement, Care After °Refer to this sheet in the next few weeks. These instructions provide you with information on caring for yourself after your procedure. Your health care provider also may give you specific instructions. Your treatment has been planned according to the most current medical practices, but problems sometimes occur. Call your health care provider if you have any problems or questions after your procedure. °HOME CARE INSTRUCTIONS  °· See a physical therapist as directed by your health care provider. °· Take medicines only as directed by your health care provider. °· Avoid lifting or driving until you are instructed otherwise. °· If you have been sent home with a continuous passive motion machine, use it as directed by your health care provider. °SEEK MEDICAL CARE IF: °· You have difficulty breathing. °· You have drainage, redness, swelling, or pain at your incision site. °· You have a bad smell coming from your incision site. °· You have persistent bleeding from your incision site. °· Your incision breaks open after sutures (stitches) or staples have been removed. °· You have a fever. °SEEK IMMEDIATE MEDICAL CARE IF:  °· You have a rash. °· You have pain or swelling in your calf or thigh. °· You have shortness of breath or chest pain. °· Your range of motion in your knee is decreasing rather than increasing. °MAKE SURE YOU:  °· Understand these instructions. °· Will watch your condition. °· Will get help right away if you are not doing well or get worse. °Document Released: 06/28/2005 Document Revised: 04/25/2014 Document Reviewed: 01/28/2012 °[employer]® Patient Information ©2015 [employer]. This information is not intended to replace advice given to you by your health care provider. Make sure you discuss any questions you have with your health care provider. ° °

## 2014-11-16 NOTE — Progress Notes (Signed)
[facility] NOTE 11/16/2014  Patient:  Jacob Parker, Jacob Parker  Account Number:  [id number] Admit date:  11/16/2014  Clinical Social Worker:  Werner Lean, LCSW  Date/time:  11/09/2014 03:06 PM  Clinical Social Work is seeking post-discharge placement for this patient at the following level of care:   SKILLED NURSING   (*CSW will update this form in Epic as items are completed)     Patient/family provided with [place] Department of Clinical Social Work's list of facilities offering this level of care within the geographic area requested by the patient (or if unable, by the patient's family).  11/16/2014  Patient/family informed of their freedom to choose among providers that offer the needed level of care, that participate in Medicare, Medicaid or managed care program needed by the patient, have an available bed and are willing to accept the patient.    Patient/family informed of [facility]' ownership interest in [facility], as well as of the fact that they are under no obligation to receive care at this facility.  PASARR submitted to EDS on 11/16/2014 PASARR number received on 11/16/2014  FL2 transmitted to all facilities in geographic area requested by pt/family on  11/16/2014 FL2 transmitted to all facilities within larger geographic area on   Patient informed that his/her managed care company has contracts with or will negotiate with  certain facilities, including the following:     Patient/family informed of bed offers received:  11/16/2014 Patient chooses bed at [facility] Physician recommends and patient chooses bed at    Patient to be transferred to  on   Patient to be transferred to facility by  Patient and family notified of transfer on  Name of family member notified:    The following physician request were entered in Epic:   Additional Comments:  Werner Lean LCSW  [phone]

## 2014-11-16 NOTE — Plan of Care (Signed)
Problem: Phase I Progression Outcomes Goal: Hemodynamically stable Outcome: Completed/Met Date Met:  11/16/14     

## 2014-11-16 NOTE — Brief Op Note (Signed)
11/16/2014  9:13 AM  PATIENT:  Ocie Cornfield  63 y.o. male  PRE-OPERATIVE DIAGNOSIS:  degenerative joint disease  POST-OPERATIVE DIAGNOSIS:  degenerative joint disease  PROCEDURE:  Procedure(s): LEFT TOTAL KNEE ARTHROPLASTY (Left)  SURGEON:  Surgeon(s) and Role:    * Alta Corning, MD - Primary  PHYSICIAN ASSISTANT:   ASSISTANTS: bethune   ANESTHESIA:   general  EBL:  Total I/O In: 2000 [I.V.:2000] Out: 50 [Blood:50]  BLOOD ADMINISTERED:none  DRAINS: (1) Hemovact drain(s) in the l knee with  Suction Open   LOCAL MEDICATIONS USED:  MARCAINE    and OTHER experel  SPECIMEN:  No Specimen  DISPOSITION OF SPECIMEN:  N/A  COUNTS:  YES  TOURNIQUET:   Total Tourniquet Time Documented: Thigh (Left) - 54 minutes Total: Thigh (Left) - 54 minutes   DICTATION: .Other Dictation: Dictation Number [id number]  PLAN OF CARE: Admit to inpatient   PATIENT DISPOSITION:  PACU - hemodynamically stable.   Delay start of Pharmacological VTE agent (>24hrs) due to surgical blood loss or risk of bleeding: no

## 2014-11-16 NOTE — Progress Notes (Signed)
Clinical Social Work Department BRIEF PSYCHOSOCIAL ASSESSMENT 11/16/2014  Patient:  Jacob Parker, Jacob Parker     Account Number:  [id number]     Admit date:  11/16/2014  Clinical Social Worker:  Lacie Scotts  Date/Time:  11/16/2014 02:59 PM  Referred by:  Physician  Date Referred:  11/16/2014 Referred for  SNF Placement   Other Referral:   Interview type:  Patient Other interview type:    PSYCHOSOCIAL DATA Living Status:  WIFE Admitted from facility:   Level of care:   Primary support name:  Ivin Booty Primary support relationship to patient:  SPOUSE Degree of support available:   supportive    CURRENT CONCERNS Current Concerns  Post-Acute Placement   Other Concerns:    SOCIAL WORK ASSESSMENT / PLAN Pt is a 63 yr old gentleman living at home prior to hospitalization. CSW met with pt to assist with d/c planning. This is a planned admission. Pt has made prior arrangements to have ST Rehab at [facility] following hospital d/c. CSW has contacted SNF and d/c plans have been confirmed pending [facility] authorization. CSW has contacted [facility] and authorization will be provided at time of d/c.   Assessment/plan status:  Psychosocial Support/Ongoing Assessment of Needs Other assessment/ plan:   Information/referral to community resources:   Insurance coverage for SNF and ambulance transport reviewed.    PATIENT'S/FAMILY'S RESPONSE TO PLAN OF CARE: Pt's mood is bright. He is pleased surgery is over. Pt is motivated to begin therapy and is looking forward to having rehab at [facility].    Werner Lean LCSW [phone]

## 2014-11-16 NOTE — Op Note (Signed)
Jacob Parker, Jacob Parker NO.:  [id number]  MEDICAL RECORD NO.:  [id number]  LOCATION:  [facility]                         FACILITY:  [facility]  PHYSICIAN:  Alta Corning, M.D.   DATE OF BIRTH:  12-Sep-1951  DATE OF PROCEDURE:  11/16/2014 DATE OF DISCHARGE:                              OPERATIVE REPORT   PREOPERATIVE DIAGNOSIS:  End-stage degenerative joint disease, left knee.  POSTOPERATIVE DIAGNOSIS:  End-stage degenerative joint disease, left knee.  PROCEDURE:  Left total knee replacement with a Sigma system, size 5 femur, size 5 tibia, 10 mm bridging bearing, and a 38 mm all polyethylene patella.  SURGEON:  Alta Corning, M.D.  ASSISTANT:  Gary Fleet, P.A.  ANESTHESIA:  General.  BRIEF HISTORY:  The patient is a 63 year old male with a long history of significant complaints of left knee pain to be treated conservatively for a prolonged period of time.  After failure of conservative care and x-ray showing bone-on-bone changes in the lateral compartment and because of light activity pain and night pain, the patient was taken to the operating room for left total knee replacement.  DESCRIPTION OF PROCEDURE:  The patient was taken to the operating room after adequate anesthesia was obtained with general anesthetic, the patient was placed in operating table, left leg prepped and draped in sterile fashion.  Following this, the leg was exsanguinated.  Blood pressure tourniquet inflated to 350 mmHg.  Following this, a midline incision was made.  The subcutaneous extensor mechanism of medial parapatellar arthrotomy was undertaken.  Following this, attention was turned towards the knee where the medial and lateral meniscus were removed.  Retropatellar fat pad, synovium in the anterior aspect of the femur and anterior and posterior cruciates. The tibia was then exposed, then cut perpendicular to its long axis with an extramedullary guide.  Attention was then turned  to the femur where the femur was cut with a 5- degree valgus inclination relative to the long axis and the anterior and posterior cuts were made, the chamfer and box.  Once this was done, attention was turned to the tibia, sized to a 5.  It was drilled and keeled and the trials were put in place with the 10 mm bridging bearing, perfect gap balance and alignment was achieved at this point.  At this point, attention was turned to the patella, which was cut down to a level of 14 mm and 38 paddle was chosen and lugs were drilled.  The patella was placed and lugs were drilled for the femur.  Knee was put through a range of motion.  Excellent stability and range of motion.  At this point, all trial components were removed.  The knee was copiously and thoroughly lavaged with pulse lavage irrigation and suctioned dry. The final components were then cemented in place, size 5 femur, size 4 tibia.  10 mm bridging trial was placed and 38 mm all poly patella was placed and held with a clamp.  Once this was completed, the cement was allowed to completely harden and all excess bone cement then removed. The tourniquet was let down.  All bleeding was controlled with electrocautery.  The knee is irrigated, 40 mL, 20 mL  Exparel with 20 mL 0.5% Marcaine with 1:100,000 of epinephrine was instilled all throughout the knee for postoperative anesthesia and pain control.  Once this was completed, attention was turned towards the placement of the final polyethylene liner and the knee was again put through a range of motion. Excess stability gap balance were achieved.  The medial parapatellar arthrotomy was closed with a 1 Vicryl running.  The skin with 0 and 2-0 Vicryl, 3-0 Monocryl subcuticular.  Benzoin and Steri-Strips were applied.  Dry sterile compressive dressing was applied.  The patient was taken to the recovery room where he was noted to be in satisfactory condition.  Estimated blood loss for the procedure  is minimal.     Alta Corning, M.D.     Corliss Skains  D:  11/16/2014  T:  11/16/2014  Job:  [id number]

## 2014-11-16 NOTE — H&P (Signed)
TOTAL KNEE ADMISSION H&P  Patient is being admitted for left total knee arthroplasty.  Subjective:  Chief Complaint:left knee pain.  HPI: Jacob Parker, 63 y.o. male, has a history of pain and functional disability in the left knee due to arthritis and has failed non-surgical conservative treatments for greater than 12 weeks to includeNSAID's and/or analgesics, flexibility and strengthening excercises, supervised PT with diminished ADL's post treatment, weight reduction as appropriate and activity modification.  Onset of symptoms was gradual, starting 8 years ago with gradually worsening course since that time. The patient noted no past surgery on the left knee(s).  Patient currently rates pain in the left knee(s) at 8 out of 10 with activity. Patient has night pain, worsening of pain with activity and weight bearing, pain that interferes with activities of daily living, pain with passive range of motion, crepitus and joint swelling.  Patient has evidence of subchondral sclerosis, periarticular osteophytes and joint space narrowing by imaging studies. This patient has had failure of all reasonable conservative care.. There is no active infection.  There are no active problems to display for this patient.  Past Medical History  Diagnosis Date  . Hypertension   . Hyperlipidemia   . Coronary artery disease 2011    has 2 stents  . DJD (degenerative joint disease)   . History of skin cancer 1981 / 2015  . Stricture esophagus   . Sleep apnea     uses c pap    Past Surgical History  Procedure Laterality Date  . Appendectomy    . Vasectomy    . Fracture surgery  1985    l foot fx / l leg  . Hernia repair  1973  . Coronary angioplasty with stent placement  2011     x 2 stents  . Upper gi endoscopy    . Colonoscopy      Prescriptions prior to admission  Medication Sig Dispense Refill Last Dose  . aspirin 81 MG tablet Take 81 mg by mouth daily.   Taking  . esomeprazole (NEXIUM) 20 MG  capsule Take 20 mg by mouth daily before breakfast.   Taking  . lisinopril (PRINIVIL,ZESTRIL) 20 MG tablet Take 20 mg by mouth at bedtime.     . Multiple Vitamin (MULTIVITAMIN WITH MINERALS) TABS tablet Take 1 tablet by mouth daily.     . nebivolol (BYSTOLIC) 5 MG tablet Take 5 mg by mouth every morning.     . Omega 3 1000 MG CAPS Take 1,000 mg by mouth daily.     . rosuvastatin (CRESTOR) 20 MG tablet Take 20 mg by mouth daily.   Taking  . rosuvastatin (CRESTOR) 40 MG tablet Take 40 mg by mouth every evening.     . hyoscyamine (LEVBID) 0.375 MG 12 hr tablet Take 1 tablet (0.375 mg total) by mouth every 12 (twelve) hours as needed for cramping. 30 tablet 0   . lisinopril (PRINIVIL,ZESTRIL) 40 MG tablet Take 40 mg by mouth daily.   Taking  . meloxicam (MOBIC) 15 MG tablet Take 1 tablet (15 mg total) by mouth daily. 30 tablet 0    No Known Allergies  History  Substance Use Topics  . Smoking status: Never Smoker   . Smokeless tobacco: Not on file  . Alcohol Use: 1.5 oz/week    3 Not specified per week     Comment: occasional    No family history on file.   ROS ROS: I have reviewed the patient's review of systems thoroughly and  there are no positive responses as relates to the HPI.  Objective:  Physical Exam  Vital signs in last 24 hours: Temp:  [98.6 F (37 C)] 98.6 F (37 C) (11/25 0536) Pulse Rate:  [87] 87 (11/25 0536) Resp:  [18] 18 (11/25 0536) BP: (147)/(88) 147/88 mmHg (11/25 0536) SpO2:  [98 %] 98 % (11/25 0536) Well-developed well-nourished patient in no acute distress. Alert and oriented x3 HEENT:within normal limits Cardiac: Regular rate and rhythm Pulmonary: Lungs clear to auscultation Abdomen: Soft and nontender.  Normal active bowel sounds  Musculoskeletal: (l knee: painful rom//mild valgus allignment//no instability// skin is benign Labs: Recent Results (from the past 2160 hour(s))  Surgical pcr screen     Status: Abnormal   Collection Time: 11/08/14  9:18  AM  Result Value Ref Range   MRSA, PCR INVALID RESULTS, SPECIMEN SENT FOR CULTURE (A) NEGATIVE    Comment: Franz Dell RN AT 1025 ON 11.17.15 BY SHUEA   Staphylococcus aureus INVALID RESULTS, SPECIMEN SENT FOR CULTURE (A) NEGATIVE    Comment: M. FAISON RN AT 3491 ON 11.17.15 BY SHUEA        The Xpert SA Assay (FDA approved for NASAL specimens in patients over 59 years of age), is one component of a comprehensive surveillance program.  Test performance has been validated by [employer] for patients greater than or equal to 65 year old. It is not intended to diagnose infection nor to guide or monitor treatment.   Urinalysis, Routine w reflex microscopic     Status: Abnormal   Collection Time: 11/08/14  9:18 AM  Result Value Ref Range   Color, Urine YELLOW YELLOW   APPearance CLOUDY (A) CLEAR   Specific Gravity, Urine 1.023 1.005 - 1.030   pH 6.0 5.0 - 8.0   Glucose, UA NEGATIVE NEGATIVE mg/dL   Hgb urine dipstick NEGATIVE NEGATIVE   Bilirubin Urine NEGATIVE NEGATIVE   Ketones, ur NEGATIVE NEGATIVE mg/dL   Protein, ur NEGATIVE NEGATIVE mg/dL   Urobilinogen, UA 0.2 0.0 - 1.0 mg/dL   Nitrite NEGATIVE NEGATIVE   Leukocytes, UA NEGATIVE NEGATIVE    Comment: MICROSCOPIC NOT DONE ON URINES WITH NEGATIVE PROTEIN, BLOOD, LEUKOCYTES, NITRITE, OR GLUCOSE <1000 mg/dL.  MRSA culture     Status: None   Collection Time: 11/08/14  9:18 AM  Result Value Ref Range   Specimen Description NOSE    Special Requests NONE    Culture      NO STAPHYLOCOCCUS AUREUS ISOLATED Note: NOMRSA Performed at [facility]    Report Status 11/10/2014 FINAL   APTT     Status: None   Collection Time: 11/08/14  9:20 AM  Result Value Ref Range   aPTT 28 24 - 37 seconds  CBC WITH DIFFERENTIAL     Status: Abnormal   Collection Time: 11/08/14  9:20 AM  Result Value Ref Range   WBC 5.5 4.0 - 10.5 K/uL   RBC 4.92 4.22 - 5.81 MIL/uL   Hemoglobin 14.8 13.0 - 17.0 g/dL   HCT 44.0 39.0 - 52.0 %   MCV 89.4  78.0 - 100.0 fL   MCH 30.1 26.0 - 34.0 pg   MCHC 33.6 30.0 - 36.0 g/dL   RDW 12.8 11.5 - 15.5 %   Platelets 202 150 - 400 K/uL   Neutrophils Relative % 51 43 - 77 %   Neutro Abs 2.8 1.7 - 7.7 K/uL   Lymphocytes Relative 31 12 - 46 %   Lymphs Abs 1.7 0.7 - 4.0  K/uL   Monocytes Relative 9 3 - 12 %   Monocytes Absolute 0.5 0.1 - 1.0 K/uL   Eosinophils Relative 8 (H) 0 - 5 %   Eosinophils Absolute 0.5 0.0 - 0.7 K/uL   Basophils Relative 1 0 - 1 %   Basophils Absolute 0.1 0.0 - 0.1 K/uL  Comprehensive metabolic panel     Status: Abnormal   Collection Time: 11/08/14  9:20 AM  Result Value Ref Range   Sodium 141 137 - 147 mEq/L   Potassium 4.9 3.7 - 5.3 mEq/L   Chloride 101 96 - 112 mEq/L   CO2 29 19 - 32 mEq/L   Glucose, Bld 93 70 - 99 mg/dL   BUN 14 6 - 23 mg/dL   Creatinine, Ser 1.02 0.50 - 1.35 mg/dL   Calcium 9.7 8.4 - 10.5 mg/dL   Total Protein 7.4 6.0 - 8.3 g/dL   Albumin 3.9 3.5 - 5.2 g/dL   AST 37 0 - 37 U/L   ALT 64 (H) 0 - 53 U/L   Alkaline Phosphatase 91 39 - 117 U/L   Total Bilirubin 0.7 0.3 - 1.2 mg/dL   GFR calc non Af Amer 75 (L) >90 mL/min   GFR calc Af Amer 87 (L) >90 mL/min    Comment: (NOTE) The eGFR has been calculated using the CKD EPI equation. This calculation has not been validated in all clinical situations. eGFR's persistently <90 mL/min signify possible Chronic Kidney Disease.    Anion gap 11 5 - 15  Protime-INR     Status: None   Collection Time: 11/08/14  9:20 AM  Result Value Ref Range   Prothrombin Time 13.1 11.6 - 15.2 seconds   INR 0.98 0.00 - 1.49  Type and screen     Status: None   Collection Time: 11/08/14  9:20 AM  Result Value Ref Range   ABO/RH(D) A NEG    Antibody Screen NEG    Sample Expiration 11/22/2014   ABO/Rh     Status: None   Collection Time: 11/08/14  9:30 AM  Result Value Ref Range   ABO/RH(D) A NEG     Estimated body mass index is 24.62 kg/(m^2) as calculated from the following:   Height as of 11/08/14: 6' 3"  (1.905 m).   Weight as of 07/17/13: 197 lb (89.359 kg).   Imaging Review Plain radiographs demonstrate severe degenerative joint disease of the bilaterally knee(s). The overall alignment ismild valgus. The bone quality appears to be good for age and reported activity level.  Assessment/Plan:  End stage arthritis, left knee   The patient history, physical examination, clinical judgment of the provider and imaging studies are consistent with end stage degenerative joint disease of the left knee(s) and total knee arthroplasty is deemed medically necessary. The treatment options including medical management, injection therapy arthroscopy and arthroplasty were discussed at length. The risks and benefits of total knee arthroplasty were presented and reviewed. The risks due to aseptic loosening, infection, stiffness, patella tracking problems, thromboembolic complications and other imponderables were discussed. The patient acknowledged the explanation, agreed to proceed with the plan and consent was signed. Patient is being admitted for inpatient treatment for surgery, pain control, PT, OT, prophylactic antibiotics, VTE prophylaxis, progressive ambulation and ADL's and discharge planning. The patient is planning to be discharged home with home health services

## 2014-11-16 NOTE — Anesthesia Procedure Notes (Signed)
Spinal Patient location during procedure: OR Start time: 11/16/2014 7:39 AM End time: 11/16/2014 7:44 AM Staffing Resident/CRNA: Sherian Maroon A Performed by: resident/CRNA  Preanesthetic Checklist Completed: patient identified, site marked, surgical consent, pre-op evaluation, timeout performed, IV checked, risks and benefits discussed and monitors and equipment checked Spinal Block Patient position: sitting Prep: Betadine Patient monitoring: heart rate, cardiac monitor, continuous pulse ox and blood pressure Approach: midline Location: L3-4 Needle Needle type: Sprotte  Needle gauge: 24 G Needle length: 9 cm Needle insertion depth: 5 cm Assessment Sensory level: T10

## 2014-11-16 NOTE — Transfer of Care (Signed)
Immediate Anesthesia Transfer of Care Note  Patient: Jacob Parker  Procedure(s) Performed: Procedure(s): LEFT TOTAL KNEE ARTHROPLASTY (Left)  Patient Location: PACU  Anesthesia Type:Spinal  Level of Consciousness: awake, alert , oriented and patient cooperative  Airway & Oxygen Therapy: Patient Spontanous Breathing and Patient connected to face mask oxygen  Post-op Assessment: Report given to PACU RN and Post -op Vital signs reviewed and stable  Post vital signs: Reviewed and stable  Complications: No apparent anesthesia complications

## 2014-11-16 NOTE — Plan of Care (Signed)
Problem: Phase I Progression Outcomes Goal: CMS/Neurovascular status WDL Outcome: Completed/Met Date Met:  11/16/14     

## 2014-11-16 NOTE — Anesthesia Postprocedure Evaluation (Signed)
Anesthesia Post Note  Patient: Jacob Parker  Procedure(s) Performed: Procedure(s) (LRB): LEFT TOTAL KNEE ARTHROPLASTY (Left)  Anesthesia type: Spinal  Patient location: PACU  Post pain: Pain level controlled  Post assessment: Post-op Vital signs reviewed  Last Vitals: BP 155/85 mmHg  Pulse 81  Temp(Src) 36.8 C (Axillary)  Resp 20  Ht 6\' 3"  (1.905 m)  Wt 217 lb (98.431 kg)  BMI 27.12 kg/m2  SpO2 98%  Post vital signs: Reviewed  Level of consciousness: sedated  Complications: No apparent anesthesia complications

## 2014-11-17 LAB — BASIC METABOLIC PANEL
Anion gap: 11 (ref 5–15)
BUN: 11 mg/dL (ref 6–23)
CALCIUM: 8.5 mg/dL (ref 8.4–10.5)
CO2: 27 meq/L (ref 19–32)
Chloride: 98 mEq/L (ref 96–112)
Creatinine, Ser: 1.11 mg/dL (ref 0.50–1.35)
GFR calc Af Amer: 79 mL/min — ABNORMAL LOW (ref >90)
GFR calc non Af Amer: 68 mL/min — ABNORMAL LOW (ref >90)
Glucose, Bld: 137 mg/dL — ABNORMAL HIGH (ref 70–99)
Potassium: 3.8 mEq/L (ref 3.7–5.3)
Sodium: 136 mEq/L — ABNORMAL LOW (ref 137–147)

## 2014-11-17 LAB — CBC
HCT: 32.1 % — ABNORMAL LOW (ref 39.0–52.0)
HEMOGLOBIN: 11 g/dL — AB (ref 13.0–17.0)
MCH: 30.4 pg (ref 26.0–34.0)
MCHC: 34.3 g/dL (ref 30.0–36.0)
MCV: 88.7 fL (ref 78.0–100.0)
PLATELETS: 163 10*3/uL (ref 150–400)
RBC: 3.62 MIL/uL — ABNORMAL LOW (ref 4.22–5.81)
RDW: 12.9 % (ref 11.5–15.5)
WBC: 9.7 10*3/uL (ref 4.0–10.5)

## 2014-11-17 NOTE — Progress Notes (Signed)
Physical Therapy Treatment Patient Details Name: Jacob Parker MRN: [id number] DOB: 1951-12-10 Today's Date: 2014-11-25    History of Present Illness      PT Comments    Pt progressing with mobility but continues ltd by pain tolerance.  Follow Up Recommendations  SNF     Equipment Recommendations  Rolling walker with 5" wheels    Recommendations for Other Services OT consult     Precautions / Restrictions Precautions Precautions: Fall Restrictions Weight Bearing Restrictions: No    Mobility  Bed Mobility Overal bed mobility: Needs Assistance Bed Mobility: Supine to Sit     Supine to sit: Min assist     General bed mobility comments: cues for sequence and use of R LE to self assist  Transfers Overall transfer level: Needs assistance Equipment used: Rolling walker (2 wheeled) Transfers: Sit to/from Stand Sit to Stand: Min assist         General transfer comment: cues for LE management and use of UEs to self assist  Ambulation/Gait Ambulation/Gait assistance: Min assist Ambulation Distance (Feet): 76 Feet Assistive device: Rolling walker (2 wheeled) Gait Pattern/deviations: Step-to pattern;Decreased step length - right;Decreased step length - left;Shuffle;Trunk flexed Gait velocity: decr   General Gait Details: cues for sequence, posture and position from [employer]            Wheelchair Mobility    Modified Rankin (Stroke Patients Only)       Balance                                    Cognition Arousal/Alertness: Awake/alert Behavior During Therapy: WFL for tasks assessed/performed Overall Cognitive Status: Within Functional Limits for tasks assessed                      Exercises      General Comments        Pertinent Vitals/Pain Pain Assessment: 0-10 Pain Score: 7  Pain Location: L knee Pain Descriptors / Indicators: Aching;Sore Pain Intervention(s): Limited activity within patient's  tolerance;Monitored during session;Premedicated before session;Ice applied    Home Living                      Prior Function            PT Goals (current goals can now be found in the care plan section) Acute Rehab PT Goals Patient Stated Goal: Resume previous lifestyle with decreased pain PT Goal Formulation: With patient Time For Goal Achievement: 11/23/14 Potential to Achieve Goals: Good Progress towards PT goals: Progressing toward goals    Frequency  7X/week    PT Plan Current plan remains appropriate    Co-evaluation             End of Session Equipment Utilized During Treatment: Gait belt;Left knee immobilizer Activity Tolerance: Patient tolerated treatment well Patient left: in chair;with call bell/phone within reach     Time: 1030-1056 PT Time Calculation (min) (ACUTE ONLY): 26 min  Charges:  $Gait Training: 23-37 mins                    G Codes:      Analina Filla 11-25-2014, 12:16 PM

## 2014-11-17 NOTE — Progress Notes (Signed)
Subjective: L TKA 11-16-14 C/o more pain this am, but percocet due Stil with IVFs, tol po liquid and had soup/salad last night   Objective: Vital signs in last 24 hours: Temp:  [97.4 F (36.3 C)-99.2 F (37.3 C)] 99.2 F (37.3 C) (11/26 0530) Pulse Rate:  [57-94] 86 (11/26 0530) Resp:  [7-23] 20 (11/26 0530) BP: (120-156)/(62-85) 120/75 mmHg (11/26 0530) SpO2:  [95 %-100 %] 100 % (11/26 0530) Weight:  [98.431 kg (217 lb)] 98.431 kg (217 lb) (11/25 1259)  Intake/Output from previous day: 11/25 0701 - 11/26 0700 In: 5003 [P.O.:840; I.V.:3400; IV Piggyback:50] Out: 7048 [Urine:1650; Drains:20; Blood:50] Intake/Output this shift:     Recent Labs  11/17/14 0511  HGB 11.0*    Recent Labs  11/17/14 0511  WBC 9.7  RBC 3.62*  HCT 32.1*  PLT 163    Recent Labs  11/17/14 0511  NA 136*  K 3.8  CL 98  CO2 27  BUN 11  CREATININE 1.11  GLUCOSE 137*  CALCIUM 8.5   No results for input(s): LABPT, INR in the last 72 hours.  dressing C/D/I, + F/E toes, intact ST sens P/D/1web  Assessment/Plan: Continue postop protocol, heplock IVFs once adequate PO Analgesics as needed, PT, poss D/C tomorrow to [facility], Yurani Fettes A. 11/17/2014, 8:42 AM

## 2014-11-17 NOTE — Progress Notes (Signed)
Physical Therapy Treatment Patient Details Name: Jacob Parker MRN: [id number] DOB: [birth year] Today's Date: 11/17/2014    History of Present Illness Pt was admitted for L TKA    PT Comments    Progressing well with mobility.  Pain limited with knee ROM.    Follow Up Recommendations  SNF     Equipment Recommendations  Rolling walker with 5" wheels    Recommendations for Other Services OT consult     Precautions / Restrictions Precautions Precautions: Fall Required Braces or Orthoses: Knee Immobilizer - Left Knee Immobilizer - Left: Discontinue once straight leg raise with < 10 degree lag Restrictions Weight Bearing Restrictions: No    Mobility  Bed Mobility Overal bed mobility: Needs Assistance Bed Mobility: Sit to Supine       Sit to supine: Min assist   General bed mobility comments: cues for sequence and use of R LE to self assist  Transfers Overall transfer level: Needs assistance Equipment used: Rolling walker (2 wheeled) Transfers: Sit to/from Stand Sit to Stand: Min assist         General transfer comment: cues for LE management and use of UEs  Ambulation/Gait Ambulation/Gait assistance: Min assist Ambulation Distance (Feet): 68 Feet (twice) Assistive device: Rolling walker (2 wheeled) Gait Pattern/deviations: Step-to pattern;Shuffle     General Gait Details: cues for sequence, posture and position from RW   Stairs            Wheelchair Mobility    Modified Rankin (Stroke Patients Only)       Balance                                    Cognition Arousal/Alertness: Awake/alert Behavior During Therapy: WFL for tasks assessed/performed Overall Cognitive Status: Within Functional Limits for tasks assessed                      Exercises Total Joint Exercises Ankle Circles/Pumps: AROM;Right;10 reps;Supine Quad Sets: AROM;Both;Supine;10 reps Heel Slides: AAROM;15 reps;Supine;Left Straight Leg Raises:  AAROM;Left;Supine;10 reps Goniometric ROM: AAROM L knee - 10 - 30 - pain limited with muscle guarding    General Comments        Pertinent Vitals/Pain Pain Assessment: 0-10 Pain Score: 6  Pain Location: L knee Pain Descriptors / Indicators: Aching;Sore Pain Intervention(s): Limited activity within patient's tolerance;Monitored during session;Premedicated before session    Home Living                      Prior Function            PT Goals (current goals can now be found in the care plan section) Acute Rehab PT Goals Patient Stated Goal: Resume previous lifestyle with decreased pain PT Goal Formulation: With patient Time For Goal Achievement: 11/23/14 Potential to Achieve Goals: Good Progress towards PT goals: Progressing toward goals    Frequency  7X/week    PT Plan Current plan remains appropriate    Co-evaluation             End of Session Equipment Utilized During Treatment: Gait belt;Left knee immobilizer Activity Tolerance: Patient tolerated treatment well Patient left: in bed;with call bell/phone within reach     Time: 1510-1543 PT Time Calculation (min) (ACUTE ONLY): 33 min  Charges:  $Gait Training: 8-22 mins $Therapeutic Exercise: 8-22 mins  G Codes:      Chiquita Heckert 11/21/2014, 4:06 PM

## 2014-11-17 NOTE — Evaluation (Signed)
Occupational Therapy Evaluation Patient Details Name: Jacob Parker MRN: [id number] DOB: January 13, 1951 Today's Date: 11/17/2014    History of Present Illness Pt was admitted for L TKA   Clinical Impression   This 63 year old man was admitted for the above surgery.  Evaluation was limited due to pain.  Pt will benefit from continued OT in acute and at [facility] to increase tolerance for and independence with adls.  Goals in acute are for min guard to min A level.  He needs mod to max A for LB adls at this time.    Follow Up Recommendations  SNF    Equipment Recommendations  3 in 1 bedside comode    Recommendations for Other Services       Precautions / Restrictions Precautions Precautions: Fall Required Braces or Orthoses: Knee Immobilizer - Left Restrictions Weight Bearing Restrictions: No      Mobility Bed Mobility            General bed mobility comments: pt up in chair  Transfers             General transfer comment: did not stand with OT:  needed min A with PT    Balance                                            ADL Overall ADL's : Needs assistance/impaired                                       General ADL Comments: pt can perform UB adls with set up.  He needs mod A for LB bathing and max A for LB dressing.  Educated on AE and used reacher and sock aide on RLE.  did not stand during OT as pt's pain increaed from 4 to 7 when he scooted to edge of chair and feet were supported on floor.     Vision                     Perception     Praxis      Pertinent Vitals/Pain Pain Assessment: 0-10 Pain Score: 7  Pain Location: L knee Pain Descriptors / Indicators: Aching;Sore Pain Intervention(s): Limited activity within patient's tolerance;Monitored during session;Repositioned;Patient requesting pain meds-RN notified;Premedicated before session;Ice applied     Hand Dominance     Extremity/Trunk Assessment  Upper Extremity Assessment Upper Extremity Assessment: Overall WFL for tasks assessed           Communication Communication Communication: No difficulties   Cognition Arousal/Alertness: Awake/alert Behavior During Therapy: WFL for tasks assessed/performed Overall Cognitive Status: Within Functional Limits for tasks assessed                     General Comments       Exercises Exercises:  (Deferred to pm)     Shoulder Instructions      Home Living Family/patient expects to be discharged to:: Skilled nursing facility Living Arrangements: Spouse/significant other                               Additional Comments: does not have 24/7 assistance at home      Prior Functioning/Environment Level of Independence: Independent  OT Diagnosis: Acute pain   OT Problem List: Decreased strength;Decreased activity tolerance;Decreased knowledge of use of DME or AE;Pain   OT Treatment/Interventions: Self-care/ADL training;DME and/or AE instruction;Patient/family education    OT Goals(Current goals can be found in the care plan section) Acute Rehab OT Goals Patient Stated Goal: Resume previous lifestyle with decreased pain OT Goal Formulation: With patient Time For Goal Achievement: 11/24/14 Potential to Achieve Goals: Good ADL Goals Pt Will Perform Grooming: with min guard assist;standing Pt Will Perform Lower Body Bathing: with min assist;with adaptive equipment;sit to/from stand Pt Will Transfer to Toilet: with min guard assist;ambulating;bedside commode Pt Will Perform Toileting - Clothing Manipulation and hygiene: with min guard assist;sit to/from stand  OT Frequency: Min 2X/week   Barriers to D/C:            Co-evaluation              End of Session    Activity Tolerance: Patient limited by pain Patient left: in chair;with call bell/phone within reach;with family/visitor present   Time: 3005-1102 OT Time Calculation (min):  17 min Charges:  OT General Charges $OT Visit: 1 Procedure OT Evaluation $Initial OT Evaluation Tier I: 1 Procedure OT Treatments $Self Care/Home Management : 8-22 mins G-Codes:    Margurite Duffy 2014/12/08, 12:22 PM Lesle Chris, OTR/L [phone] 12-08-14

## 2014-11-17 NOTE — Plan of Care (Signed)
Problem: Phase I Progression Outcomes Goal: Pain controlled with appropriate interventions Outcome: Completed/Met Date Met:  11/17/14 Goal: Dangle or out of bed evening of surgery Outcome: Completed/Met Date Met:  11/17/14  Problem: Phase II Progression Outcomes Goal: Tolerating diet Outcome: Completed/Met Date Met:  11/17/14

## 2014-11-18 ENCOUNTER — Encounter (HOSPITAL_COMMUNITY): Payer: Self-pay | Admitting: Orthopedic Surgery

## 2014-11-18 LAB — CBC
HEMATOCRIT: 28.3 % — AB (ref 39.0–52.0)
Hemoglobin: 9.7 g/dL — ABNORMAL LOW (ref 13.0–17.0)
MCH: 30.7 pg (ref 26.0–34.0)
MCHC: 34.3 g/dL (ref 30.0–36.0)
MCV: 89.6 fL (ref 78.0–100.0)
Platelets: 173 10*3/uL (ref 150–400)
RBC: 3.16 MIL/uL — ABNORMAL LOW (ref 4.22–5.81)
RDW: 13.1 % (ref 11.5–15.5)
WBC: 9.5 10*3/uL (ref 4.0–10.5)

## 2014-11-18 NOTE — Plan of Care (Signed)
Problem: Consults Goal: Total Joint Replacement Patient Education See Patient Education Module for education specifics.  Outcome: Completed/Met Date Met:  11/18/14 Goal: Diagnosis- Total Joint Replacement Outcome: Completed/Met Date Met:  11/18/14 Primary Total Knee LEFT Goal: Skin Care Protocol Initiated - if Braden Score 18 or less If consults are not indicated, leave blank or document N/A  Outcome: Not Applicable Date Met:  33/29/51 Goal: Nutrition Consult-if indicated Outcome: Not Applicable Date Met:  88/41/66 Goal: Diabetes Guidelines if Diabetic/Glucose > 140 If diabetic or lab glucose is > 140 mg/dl - Initiate Diabetes/Hyperglycemia Guidelines & Document Interventions  Outcome: Not Applicable Date Met:  06/22/15  Problem: Phase I Progression Outcomes Goal: Initial discharge plan identified Outcome: Completed/Met Date Met:  11/18/14 Goal: Other Phase I Outcomes/Goals Outcome: Not Applicable Date Met:  12/31/30  Problem: Phase II Progression Outcomes Goal: Ambulates Outcome: Completed/Met Date Met:  11/18/14 Goal: Discharge plan established Outcome: Completed/Met Date Met:  11/18/14 Goal: Other Phase II Outcomes/Goals Outcome: Not Applicable Date Met:  35/57/32  Problem: Phase III Progression Outcomes Goal: Pain controlled on oral analgesia Outcome: Progressing Goal: Ambulates Outcome: Completed/Met Date Met:  11/18/14 Goal: Discharge plan remains appropriate-arrangements made Outcome: Completed/Met Date Met:  11/18/14 Goal: Anticoagulant follow-up in place Outcome: Not Applicable Date Met:  20/25/42 ASA for VTE, no f/u needed. Goal: Other Phase III Outcomes/Goals Outcome: Not Applicable Date Met:  70/62/37

## 2014-11-18 NOTE — Discharge Summary (Signed)
Physician Discharge Summary  Patient ID: Jacob Parker MRN: [id number] DOB/AGE: 07-08-1951 63 y.o.  Admit date: 11/16/2014 Discharge date: 11/18/2014  Admission Diagnoses:  Primary osteoarthritis of left knee  Discharge Diagnoses:  Principal Problem:   Primary osteoarthritis of left knee   Past Medical History  Diagnosis Date  . Hypertension   . Hyperlipidemia   . Coronary artery disease 2011    has 2 stents  . DJD (degenerative joint disease)   . History of skin cancer 1981 / 2015  . Stricture esophagus   . Sleep apnea     uses c pap    Surgeries: Procedure(s): LEFT TOTAL KNEE ARTHROPLASTY on 11/16/2014   Consultants (if any):    Discharged Condition: Improved  Hospital Course: Patric Buckhalter is an 63 y.o. male who was admitted 11/16/2014 with a diagnosis of Primary osteoarthritis of left knee and went to the operating room on 11/16/2014 and underwent the above named procedures.    He was given perioperative antibiotics:  Anti-infectives    Start     Dose/Rate Route Frequency Ordered Stop   11/16/14 1430  ceFAZolin (ANCEF) IVPB 2 g/50 mL premix     2 g100 mL/hr over 30 Minutes Intravenous Every 6 hours 11/16/14 1309 11/16/14 2017   11/16/14 0552  ceFAZolin (ANCEF) IVPB 2 g/50 mL premix     2 g100 mL/hr over 30 Minutes Intravenous On call to O.R. 11/16/14 7829 11/16/14 0730    .  He was given sequential compression devices, early ambulation, and ASA for DVT prophylaxis.  He benefited maximally from the hospital stay and there were no complications.    Recent vital signs:  Filed Vitals:   11/18/14 0659  BP: 124/71  Pulse: 96  Temp: 99 F (37.2 C)  Resp: 18    Recent laboratory studies:  Lab Results  Component Value Date   HGB 9.7* 11/18/2014   HGB 11.0* 11/17/2014   HGB 14.8 11/08/2014   Lab Results  Component Value Date   WBC 9.5 11/18/2014   PLT 173 11/18/2014   Lab Results  Component Value Date   INR 0.98 11/08/2014   Lab Results   Component Value Date   NA 136* 11/17/2014   K 3.8 11/17/2014   CL 98 11/17/2014   CO2 27 11/17/2014   BUN 11 11/17/2014   CREATININE 1.11 11/17/2014   GLUCOSE 137* 11/17/2014    Discharge Medications:     Medication List    STOP taking these medications        aspirin 81 MG tablet  Replaced by:  aspirin EC 325 MG tablet     meloxicam 15 MG tablet  Commonly known as:  MOBIC      TAKE these medications        aspirin EC 325 MG tablet  Take 1 tablet (325 mg total) by mouth 2 (two) times daily after a meal.     esomeprazole 20 MG capsule  Commonly known as:  NEXIUM  Take 20 mg by mouth daily before breakfast.     hyoscyamine 0.375 MG 12 hr tablet  Commonly known as:  LEVBID  Take 1 tablet (0.375 mg total) by mouth every 12 (twelve) hours as needed for cramping.     lisinopril 20 MG tablet  Commonly known as:  PRINIVIL,ZESTRIL  Take 20 mg by mouth at bedtime.     lisinopril 40 MG tablet  Commonly known as:  PRINIVIL,ZESTRIL  Take 40 mg by mouth daily.     methocarbamol  500 MG tablet  Commonly known as:  ROBAXIN  Take 1 tablet (500 mg total) by mouth every 8 (eight) hours as needed for muscle spasms.     multivitamin with minerals Tabs tablet  Take 1 tablet by mouth daily.     nebivolol 5 MG tablet  Commonly known as:  BYSTOLIC  Take 5 mg by mouth every morning.     Omega 3 1000 MG Caps  Take 1,000 mg by mouth daily.     oxyCODONE-acetaminophen 5-325 MG per tablet  Commonly known as:  PERCOCET/ROXICET  Take 1-2 tablets by mouth every 6 (six) hours as needed for severe pain.     rosuvastatin 40 MG tablet  Commonly known as:  CRESTOR  Take 40 mg by mouth every evening.        Diagnostic Studies: Dg Chest 2 View  11/08/2014   CLINICAL DATA:  Preoperative respiratory evaluation prior to left total knee arthroplasty. Current history of coronary artery disease post stenting in 2011.  EXAM: CHEST  2 VIEW  COMPARISON:  None.  FINDINGS: Cardiac silhouette  normal in size. Apparent left circumflex coronary artery stenting. Mild thoracic aortic atherosclerosis. Hilar and mediastinal contours otherwise unremarkable. Lungs clear. Bronchovascular markings normal. Pulmonary vascularity normal. No visible pleural effusions. No pneumothorax. Mild degenerative changes involving the thoracic spine.  IMPRESSION: No acute cardiopulmonary disease.   Electronically Signed   By: Evangeline Dakin M.D.   On: 11/08/2014 14:17    Disposition: Final discharge disposition not confirmed      Discharge Instructions    Weight bearing as tolerated    Complete by:  As directed   Laterality:  left  Extremity:  Lower           Follow-up Information    Follow up with GRAVES,JOHN L, MD. Schedule an appointment as soon as possible for a visit in 2 weeks.   Specialty:  Orthopedic Surgery   Contact information:   [place] [place] [id number] [phone]        Signed: Grandville Silos, Fuller Makin A. 11/18/2014, 9:25 AM

## 2014-11-18 NOTE — Progress Notes (Signed)
CARE MANAGEMENT NOTE 11/18/2014  Patient:  Jacob Parker, Jacob Parker   Account Number:  [id number]  Date Initiated:  11/18/2014  Documentation initiated by:  Bree Heinzelman  Subjective/Objective Assessment:   Principal Problem:    Primary osteoarthritis of left knee     Action/Plan:   snf   Anticipated DC Date:  11/18/2014   Anticipated DC Plan:  SKILLED NURSING FACILITY  In-house referral  Clinical Social Worker      DC Planning Services  CM consult      Choice offered to / List presented to:             Status of service:  Completed, signed off Medicare Important Message given?  NA - LOS <3 / Initial given by admissions (If response is "NO", the following Medicare IM given date fields will be blank) Date Medicare IM given:   Medicare IM given by:   Date Additional Medicare IM given:   Additional Medicare IM given by:    Discharge Disposition:  [place]  Per UR Regulation:  Reviewed for med. necessity/level of care/duration of stay  If discussed at [place] of Stay Meetings, dates discussed:    Comments:  [name]

## 2014-11-18 NOTE — Progress Notes (Signed)
RN called report to Bethena Roys, receiving RN at [employer].   All questions answered.   Patient transported via [place] to [facility].

## 2014-11-18 NOTE — Progress Notes (Signed)
Subjective: L TKA 11-16-14 Pain better today Tol PO, +flatus, -BM  Objective: Vital signs in last 24 hours: Temp:  [99 F (37.2 C)-99.8 F (37.7 C)] 99 F (37.2 C) (11/27 0659) Pulse Rate:  [91-96] 96 (11/27 0659) Resp:  [18] 18 (11/27 0659) BP: (116-125)/(71-82) 124/71 mmHg (11/27 0659) SpO2:  [93 %-100 %] 100 % (11/27 0659)  Intake/Output from previous day: 11/26 0701 - 11/27 0700 In: 2931.2 [P.O.:1200; I.V.:1731.2] Out: 2300 [Urine:2300] Intake/Output this shift: Total I/O In: 170 [P.O.:120; I.V.:50] Out: -    Recent Labs  11/17/14 0511 11/18/14 0434  HGB 11.0* 9.7*    Recent Labs  11/17/14 0511 11/18/14 0434  WBC 9.7 9.5  RBC 3.62* 3.16*  HCT 32.1* 28.3*  PLT 163 173    Recent Labs  11/17/14 0511  NA 136*  K 3.8  CL 98  CO2 27  BUN 11  CREATININE 1.11  GLUCOSE 137*  CALCIUM 8.5   No results for input(s): LABPT, INR in the last 72 hours.  NVI, no more drain-hole drainage, mepilex clean/intact  Assessment/Plan: D/C to [place] on chart   Jabier Deese A. 11/18/2014, 9:21 AM

## 2014-11-18 NOTE — Progress Notes (Signed)
Physical Therapy Treatment Patient Details Name: Jacob Parker MRN: [id number] DOB: 1951/05/24 Today's Date: December 16, 2014    History of Present Illness Pt was admitted for L TKA    PT Comments    Progressing well with mobility and  Strength/ROM  Follow Up Recommendations  SNF     Equipment Recommendations  Rolling walker with 5" wheels    Recommendations for Other Services OT consult     Precautions / Restrictions Precautions Precautions: Fall Required Braces or Orthoses: Knee Immobilizer - Left Knee Immobilizer - Left: Discontinue once straight leg raise with < 10 degree lag Restrictions Weight Bearing Restrictions: No    Mobility  Bed Mobility Overal bed mobility: Needs Assistance Bed Mobility: Supine to Sit     Supine to sit: Min guard     General bed mobility comments: cues for sequence and use of R LE to self assist  Transfers Overall transfer level: Needs assistance Equipment used: Rolling walker (2 wheeled) Transfers: Sit to/from Stand Sit to Stand: Min guard         General transfer comment: cues for LE management and use of UEs  Ambulation/Gait Ambulation/Gait assistance: Min guard Ambulation Distance (Feet): 88 Feet (twice) Assistive device: Rolling walker (2 wheeled) Gait Pattern/deviations: Step-to pattern;Shuffle     General Gait Details: min cues for sequence, posture and position from [employer]            Wheelchair Mobility    Modified Rankin (Stroke Patients Only)       Balance                                    Cognition Arousal/Alertness: Awake/alert Behavior During Therapy: WFL for tasks assessed/performed Overall Cognitive Status: Within Functional Limits for tasks assessed                      Exercises Total Joint Exercises Ankle Circles/Pumps: AROM;Right;10 reps;Supine Quad Sets: AROM;Both;Supine;15 reps Heel Slides: AAROM;15 reps;Supine;Left Straight Leg Raises:  AAROM;Left;Supine;20 reps    General Comments        Pertinent Vitals/Pain Pain Assessment: 0-10 Pain Score: 7  Pain Location: L knee Pain Descriptors / Indicators: Aching;Sore Pain Intervention(s): Limited activity within patient's tolerance;Monitored during session;Premedicated before session;Ice applied    Home Living                      Prior Function            PT Goals (current goals can now be found in the care plan section) Acute Rehab PT Goals Patient Stated Goal: Resume previous lifestyle with decreased pain PT Goal Formulation: With patient Time For Goal Achievement: 11/23/14 Potential to Achieve Goals: Good Progress towards PT goals: Progressing toward goals    Frequency  7X/week    PT Plan Current plan remains appropriate    Co-evaluation             End of Session Equipment Utilized During Treatment: Gait belt;Left knee immobilizer Activity Tolerance: Patient tolerated treatment well Patient left: in chair;with call bell/phone within reach     Time: 0913-0948 PT Time Calculation (min) (ACUTE ONLY): 35 min  Charges:  $Gait Training: 8-22 mins $Therapeutic Exercise: 8-22 mins                    G Codes:      Jacob Parker 12-16-2014, 12:39 PM

## 2014-11-18 NOTE — Progress Notes (Signed)
Clinical Social Work Department CLINICAL SOCIAL WORK PLACEMENT NOTE 11/18/2014  Patient:  Jacob Parker, Jacob Parker  Account Number:  [id number] Admit date:  11/16/2014  Clinical Social Worker:  Werner Lean, LCSW  Date/time:  11/09/2014 03:06 PM  Clinical Social Work is seeking post-discharge placement for this patient at the following level of care:   SKILLED NURSING   (*CSW will update this form in Epic as items are completed)     Patient/family provided with [place] Department of Clinical Social Work's list of facilities offering this level of care within the geographic area requested by the patient (or if unable, by the patient's family).  11/16/2014  Patient/family informed of their freedom to choose among providers that offer the needed level of care, that participate in Medicare, Medicaid or managed care program needed by the patient, have an available bed and are willing to accept the patient.    Patient/family informed of [facility]' ownership interest in [facility], as well as of the fact that they are under no obligation to receive care at this facility.  PASARR submitted to EDS on 11/16/2014 PASARR number received on 11/16/2014  FL2 transmitted to all facilities in geographic area requested by pt/family on  11/16/2014 FL2 transmitted to all facilities within larger geographic area on   Patient informed that his/her managed care company has contracts with or will negotiate with  certain facilities, including the following:     Patient/family informed of bed offers received:  11/16/2014 Patient chooses bed at [place] Physician recommends and patient chooses bed at    Patient to be transferred to [place] on  11/18/2014 Patient to be transferred to facility by [facility] Patient and family notified of transfer on 11/18/2014 Name of family member notified:  SPOUSE  The following physician request were entered in Epic:   Additional Comments: Pt/spouse  are in agreement with d/c to SNF today. [facility] transport needed. [employer] provided prior authorization for SNF placement. NSG reviewed d/c summary, scripts, avs. Scripts included in d/c packet.  Werner Lean LCSW [phone]

## 2014-11-21 ENCOUNTER — Other Ambulatory Visit: Payer: Self-pay | Admitting: *Deleted

## 2014-11-21 MED ORDER — OXYCODONE-ACETAMINOPHEN 5-325 MG PO TABS: 1.0000 | ORAL_TABLET | Freq: Four times a day (QID) | ORAL | Status: DC | PRN

## 2014-11-21 NOTE — Telephone Encounter (Signed)
[name] 

## 2014-11-22 ENCOUNTER — Non-Acute Institutional Stay (SKILLED_NURSING_FACILITY): Payer: Commercial Managed Care - HMO | Admitting: Adult Health

## 2014-11-22 DIAGNOSIS — Z96652 Presence of left artificial knee joint: Secondary | ICD-10-CM

## 2014-11-22 DIAGNOSIS — E785 Hyperlipidemia, unspecified: Secondary | ICD-10-CM

## 2014-11-22 DIAGNOSIS — K5900 Constipation, unspecified: Secondary | ICD-10-CM | POA: Diagnosis not present

## 2014-11-22 DIAGNOSIS — K219 Gastro-esophageal reflux disease without esophagitis: Secondary | ICD-10-CM

## 2014-11-22 DIAGNOSIS — R21 Rash and other nonspecific skin eruption: Secondary | ICD-10-CM

## 2014-11-22 DIAGNOSIS — M1712 Unilateral primary osteoarthritis, left knee: Secondary | ICD-10-CM | POA: Diagnosis not present

## 2014-11-22 DIAGNOSIS — I10 Essential (primary) hypertension: Secondary | ICD-10-CM | POA: Diagnosis not present

## 2014-11-23 ENCOUNTER — Non-Acute Institutional Stay (SKILLED_NURSING_FACILITY): Payer: Commercial Managed Care - HMO | Admitting: Internal Medicine

## 2014-11-23 DIAGNOSIS — L259 Unspecified contact dermatitis, unspecified cause: Secondary | ICD-10-CM

## 2014-11-23 DIAGNOSIS — K5900 Constipation, unspecified: Secondary | ICD-10-CM

## 2014-11-23 DIAGNOSIS — I10 Essential (primary) hypertension: Secondary | ICD-10-CM

## 2014-11-23 DIAGNOSIS — L03116 Cellulitis of left lower limb: Secondary | ICD-10-CM

## 2014-11-23 DIAGNOSIS — D62 Acute posthemorrhagic anemia: Secondary | ICD-10-CM

## 2014-11-23 DIAGNOSIS — K219 Gastro-esophageal reflux disease without esophagitis: Secondary | ICD-10-CM

## 2014-11-23 DIAGNOSIS — M1712 Unilateral primary osteoarthritis, left knee: Secondary | ICD-10-CM

## 2014-11-23 DIAGNOSIS — G4700 Insomnia, unspecified: Secondary | ICD-10-CM

## 2014-11-23 DIAGNOSIS — E785 Hyperlipidemia, unspecified: Secondary | ICD-10-CM

## 2014-11-23 NOTE — Progress Notes (Signed)
Patient ID: Jacob Parker, male   DOB: 11/09/1951, 63 y.o.   MRN: [id number]     [facility]   PCP: NNODI, Doreene Burke, MD  Code Status: full code  No Known Allergies  Chief Complaint  Patient presents with  . New Admit To SNF     HPI:  63 y/o male patient is here for STR post hospital admission from 11/16/14-11/18/14 with OA of left knee. He underwent left knee arthroplasty. He is seen in his room today. He complaints of feeling tired. He has noticed a rash on his back associated with itching. His knee pain is not under control. He has muscle spasm as well. He also has trouble falling asleep at night. He mentions being on ambien in past that has helped him. He has been feeling feverish at night time for past 2-3 days and complaints of chills.   Review of Systems:  Constitutional: Negative for diaphoresis.  HENT: Negative for congestion and sore throat.   Eyes: Negative for eye pain, blurred vision, double vision and discharge.  Respiratory: Negative for cough, sputum production, shortness of breath and wheezing.   Cardiovascular: Negative for chest pain, palpitations Gastrointestinal: Negative for heartburn, nausea, vomiting, abdominal pain. Last bowel movement 2 days back Genitourinary: Negative for dysuria, flank pain.  Musculoskeletal: Negative for back pain, falls Skin: see hpi Neurological: Negative for dizziness, tingling, focal weakness and headaches.  Psychiatric/Behavioral: Negative for depression  Past Medical History  Diagnosis Date  . Hypertension   . Hyperlipidemia   . Coronary artery disease 2011    has 2 stents  . DJD (degenerative joint disease)   . History of skin cancer 1981 / 2015  . Stricture esophagus   . Sleep apnea     uses c pap   Past Surgical History  Procedure Laterality Date  . Appendectomy    . Vasectomy    . Fracture surgery  1985    l foot fx / l leg  . Hernia repair  1973  . Coronary angioplasty with  stent placement  2011     x 2 stents  . Upper gi endoscopy    . Colonoscopy    . Total knee arthroplasty Left 11/16/2014    Procedure: LEFT TOTAL KNEE ARTHROPLASTY;  Surgeon: Alta Corning, MD;  Location: [facility];  Service: Orthopedics;  Laterality: Left;   Social History:   reports that he has never smoked. He does not have any smokeless tobacco history on file. He reports that he drinks about 1.5 oz of alcohol per week. He reports that he does not use illicit drugs.  No family history on file.  Medications: Patient's Medications  New Prescriptions   No medications on file  Previous Medications   ASPIRIN EC 325 MG TABLET    Take 1 tablet (325 mg total) by mouth 2 (two) times daily after a meal.   ESOMEPRAZOLE (NEXIUM) 20 MG CAPSULE    Take 20 mg by mouth daily before breakfast.   HYOSCYAMINE (LEVBID) 0.375 MG 12 HR TABLET    Take 1 tablet (0.375 mg total) by mouth every 12 (twelve) hours as needed for cramping.   LISINOPRIL (PRINIVIL,ZESTRIL) 20 MG TABLET    Take 20 mg by mouth at bedtime.   LISINOPRIL (PRINIVIL,ZESTRIL) 40 MG TABLET    Take 40 mg by mouth daily.   METHOCARBAMOL (ROBAXIN) 500 MG TABLET    Take 1 tablet (500 mg total) by mouth every 8 (eight) hours as needed  for muscle spasms.   MULTIPLE VITAMIN (MULTIVITAMIN WITH MINERALS) TABS TABLET    Take 1 tablet by mouth daily.   NEBIVOLOL (BYSTOLIC) 5 MG TABLET    Take 5 mg by mouth every morning.   OMEGA 3 1000 MG CAPS    Take 1,000 mg by mouth daily.   OXYCODONE-ACETAMINOPHEN (PERCOCET/ROXICET) 5-325 MG PER TABLET    Take 1-2 tablets by mouth every 6 (six) hours as needed for severe pain.   ROSUVASTATIN (CRESTOR) 40 MG TABLET    Take 40 mg by mouth every evening.  Modified Medications   No medications on file  Discontinued Medications   No medications on file     Physical Exam: Filed Vitals:   11/23/14 1327  BP: 128/73  Pulse: 88  Temp: 98 F (36.7 C)  Resp: 18  SpO2: 96%    General- elderly male in no acute  distress Head- atraumatic, normocephalic Eyes- no pallor, no icterus, no discharge Neck- no cervical lymphadenopathy Throat- moist mucus membrane Nose- normal nasal mucosa, no maxillary or frontal sinus tenderness Cardiovascular- normal s1,s2, no murmurs, good dorsalis pedis Respiratory- bilateral clear to auscultation, no wheeze, no rhonchi, no crackles, no use of accessory muscles Abdomen- bowel sounds present, soft, non tender, no CVA tenderness Musculoskeletal- able to move all 4 extremities, left knee ROM limited with pain, left knee swelling with erythema and warmth present, aquacel dressing with steristrips and some drainage noted. Left leg 1+ edema Neurological- no focal deficit Skin- warm and dry, erythmatous macules in the entire back area, moisture present, no skin breakdown, excoriation marks noted Psychiatry- alert and oriented to person, place and time, normal mood and affect    Labs reviewed: Basic Metabolic Panel:  Recent Labs  11/08/14 0920 11/17/14 0511  NA 141 136*  K 4.9 3.8  CL 101 98  CO2 29 27  GLUCOSE 93 137*  BUN 14 11  CREATININE 1.02 1.11  CALCIUM 9.7 8.5   Liver Function Tests:  Recent Labs  11/08/14 0920  AST 37  ALT 64*  ALKPHOS 91  BILITOT 0.7  PROT 7.4  ALBUMIN 3.9   No results for input(s): LIPASE, AMYLASE in the last 8760 hours. No results for input(s): AMMONIA in the last 8760 hours. CBC:  Recent Labs  11/08/14 0920 11/17/14 0511 11/18/14 0434  WBC 5.5 9.7 9.5  NEUTROABS 2.8  --   --   HGB 14.8 11.0* 9.7*  HCT 44.0 32.1* 28.3*  MCV 89.4 88.7 89.6  PLT 202 163 173     Assessment/Plan  Left knee OA S/p arthroplasty. Pain and muscle spasm not controlled with current regimen, change robaxin to 500 mg every 8 hour standing. Start oxycodone ER 10 mg bid. Continue oxycodone-apap 5-325 mg 1-2 tab q6h prn breakthrough pain. Will have him work with physical therapy and occupational therapy team to help with gait training and  muscle strengthening exercises.fall precautions. Skin care. Encourage to be out of bed. Continue aspirin 325 mg bid for dvt prophylaxis.  Left knee cellulitis Start pt on doxycycline 100 mg bid for now, has ortho appointment on 12/01/14, will have it scheduled ahead to assess the incision area with current clinical findings. Check cbc with diff in am  Anemia Likely post op from blood loss, check cbc to assess further  Insomnia Start ambien 5 mg daily at bedtime to help him sleep  Constipation On senna s 1 tab bid. Add miralax 17 g daily  Contact dermatitis Continue hydrocortisone 1% cream bid for  now. Add benadryl 25 mg every 8h prn itching  gerd Continue nexium 20 mg daily  HTN bp stable, continue lisinopril 40 mg daily with bystolic 5 mg daily  HLD Continue crestor and omega 3  Family/ staff Communication: reviewed care plan with patient and nursing supervisor    Goals of care: short term rehabilitation    Labs/tests ordered- cbc with diff, cmp    Blanchie Serve, MD  [facility] [phone] (Monday-Friday 8 am - 5 pm) [phone] (afterhours)

## 2014-11-24 ENCOUNTER — Other Ambulatory Visit: Payer: Self-pay | Admitting: *Deleted

## 2014-11-24 MED ORDER — OXYCODONE-ACETAMINOPHEN 5-325 MG PO TABS: ORAL_TABLET | ORAL | Status: DC

## 2014-11-24 NOTE — Telephone Encounter (Signed)
[name] 

## 2014-11-28 ENCOUNTER — Other Ambulatory Visit: Payer: Self-pay | Admitting: Adult Health

## 2014-12-22 ENCOUNTER — Encounter (HOSPITAL_COMMUNITY): Payer: Self-pay | Admitting: *Deleted

## 2014-12-22 DIAGNOSIS — E785 Hyperlipidemia, unspecified: Secondary | ICD-10-CM | POA: Insufficient documentation

## 2014-12-22 DIAGNOSIS — Z7982 Long term (current) use of aspirin: Secondary | ICD-10-CM | POA: Diagnosis not present

## 2014-12-22 DIAGNOSIS — Z79899 Other long term (current) drug therapy: Secondary | ICD-10-CM | POA: Insufficient documentation

## 2014-12-22 DIAGNOSIS — Z85828 Personal history of other malignant neoplasm of skin: Secondary | ICD-10-CM | POA: Diagnosis not present

## 2014-12-22 DIAGNOSIS — M25562 Pain in left knee: Secondary | ICD-10-CM | POA: Insufficient documentation

## 2014-12-22 DIAGNOSIS — R609 Edema, unspecified: Secondary | ICD-10-CM | POA: Insufficient documentation

## 2014-12-22 DIAGNOSIS — I10 Essential (primary) hypertension: Secondary | ICD-10-CM | POA: Insufficient documentation

## 2014-12-22 DIAGNOSIS — Z9981 Dependence on supplemental oxygen: Secondary | ICD-10-CM | POA: Insufficient documentation

## 2014-12-22 DIAGNOSIS — G4730 Sleep apnea, unspecified: Secondary | ICD-10-CM | POA: Insufficient documentation

## 2014-12-22 DIAGNOSIS — G8918 Other acute postprocedural pain: Secondary | ICD-10-CM | POA: Diagnosis not present

## 2014-12-22 DIAGNOSIS — I2510 Atherosclerotic heart disease of native coronary artery without angina pectoris: Secondary | ICD-10-CM | POA: Insufficient documentation

## 2014-12-22 NOTE — ED Notes (Signed)
Pt had a total knee replacement the day before thanksgiving by Dr. Berenice Primas and has had some trouble with pain and swelling having to have 40cc blood drained from the knee at one point, pt states that he is having pain and swelling like that again and wonders if the knee is having the same problem as when it was drained post op

## 2014-12-23 ENCOUNTER — Emergency Department (HOSPITAL_COMMUNITY)
Admission: EM | Admit: 2014-12-23 | Discharge: 2014-12-23 | Disposition: A | Discharge status: Home / Self Care | Payer: Medicare HMO | Attending: Emergency Medicine | Admitting: Emergency Medicine

## 2014-12-23 ENCOUNTER — Emergency Department (HOSPITAL_COMMUNITY): Payer: Medicare HMO

## 2014-12-23 DIAGNOSIS — M25562 Pain in left knee: Secondary | ICD-10-CM

## 2014-12-23 DIAGNOSIS — Z7982 Long term (current) use of aspirin: Secondary | ICD-10-CM | POA: Diagnosis not present

## 2014-12-23 DIAGNOSIS — E785 Hyperlipidemia, unspecified: Secondary | ICD-10-CM | POA: Diagnosis not present

## 2014-12-23 DIAGNOSIS — G8918 Other acute postprocedural pain: Secondary | ICD-10-CM | POA: Diagnosis not present

## 2014-12-23 DIAGNOSIS — I2510 Atherosclerotic heart disease of native coronary artery without angina pectoris: Secondary | ICD-10-CM | POA: Diagnosis not present

## 2014-12-23 DIAGNOSIS — G4730 Sleep apnea, unspecified: Secondary | ICD-10-CM | POA: Diagnosis not present

## 2014-12-23 DIAGNOSIS — Z79899 Other long term (current) drug therapy: Secondary | ICD-10-CM | POA: Diagnosis not present

## 2014-12-23 DIAGNOSIS — I10 Essential (primary) hypertension: Secondary | ICD-10-CM | POA: Diagnosis not present

## 2014-12-23 DIAGNOSIS — R609 Edema, unspecified: Secondary | ICD-10-CM | POA: Diagnosis not present

## 2014-12-23 LAB — COMPREHENSIVE METABOLIC PANEL
ALBUMIN: 3.5 g/dL (ref 3.5–5.2)
ALK PHOS: 71 U/L (ref 39–117)
ALT: 21 U/L (ref 0–53)
AST: 21 U/L (ref 0–37)
Anion gap: 5 (ref 5–15)
BUN: 9 mg/dL (ref 6–23)
CALCIUM: 8.9 mg/dL (ref 8.4–10.5)
CHLORIDE: 108 meq/L (ref 96–112)
CO2: 25 mmol/L (ref 19–32)
Creatinine, Ser: 0.97 mg/dL (ref 0.50–1.35)
GFR calc Af Amer: >90 mL/min (ref >90)
GFR calc non Af Amer: 85 mL/min — ABNORMAL LOW (ref >90)
Glucose, Bld: 126 mg/dL — ABNORMAL HIGH (ref 70–99)
POTASSIUM: 3.7 mmol/L (ref 3.5–5.1)
Sodium: 138 mmol/L (ref 135–145)
TOTAL PROTEIN: 5.8 g/dL — AB (ref 6.0–8.3)
Total Bilirubin: 0.4 mg/dL (ref 0.3–1.2)

## 2014-12-23 MED ORDER — SODIUM CHLORIDE 0.9 % IV BOLUS (SEPSIS)
500.0000 mL | Freq: Once | INTRAVENOUS | Status: AC
  Administered 2014-12-23: 500 mL via INTRAVENOUS

## 2014-12-23 MED ORDER — FENTANYL 25 MCG/HR TD PT72: 25.0000 ug | MEDICATED_PATCH | TRANSDERMAL | Status: DC

## 2014-12-23 MED ORDER — FENTANYL CITRATE 0.05 MG/ML IJ SOLN
50.0000 ug | Freq: Once | INTRAMUSCULAR | Status: AC
  Administered 2014-12-23: 50 ug via INTRAVENOUS
  Filled 2014-12-23: qty 2

## 2014-12-23 NOTE — ED Notes (Signed)
Pt to US at this time.

## 2014-12-23 NOTE — ED Provider Notes (Signed)
CSN: [id number]     Arrival date & time 12/22/14  2327 History   This chart was scribed for Carmin Muskrat, MD by Martinique Peace, ED Scribe. The patient was seen in B15C/B15C. The patient's care was started at 12:12 AM.    Chief Complaint  Patient presents with  . Post-op Problem      The history is provided by the patient. No language interpreter was used.    HPI Comments: Salil Raineri is a 64 y.o. male who presents to the Emergency Department complaining of left knee pain onset earlier today. Pt reports that he had total knee replacement the day before Thanksgiving by Dr. Berenice Primas. Pt notes that he was doing well initially after surgery. He reports that 2 weeks ago, he was having problems with pain and "tightness" in his knee so he was seen by orthopedist again and had 40cc of blood drained from knee. Pt states that pain returned today and notes new onset of swelling to affected area as well. No complaints of numbness in the foot. He further denies fever, chills, vomiting, nausea, or abdominal pain. Pt is on Crestor and Lisinopril. He also states he has been taking Aspirin to address pain as well.    Past Medical History  Diagnosis Date  . Hypertension   . Hyperlipidemia   . Coronary artery disease 2011    has 2 stents  . DJD (degenerative joint disease)   . History of skin cancer 1981 / 2015  . Stricture esophagus   . Sleep apnea     uses c pap   Past Surgical History  Procedure Laterality Date  . Appendectomy    . Vasectomy    . Fracture surgery  1985    l foot fx / l leg  . Hernia repair  1973  . Coronary angioplasty with stent placement  2011     x 2 stents  . Upper gi endoscopy    . Colonoscopy    . Total knee arthroplasty Left 11/16/2014    Procedure: LEFT TOTAL KNEE ARTHROPLASTY;  Surgeon: Alta Corning, MD;  Location: [facility];  Service: Orthopedics;  Laterality: Left;   No family history on file. History  Substance Use Topics  . Smoking status: Never Smoker    . Smokeless tobacco: Not on file  . Alcohol Use: 1.5 oz/week    3 Not specified per week     Comment: occasional    Review of Systems  Constitutional: Negative for fever and chills.       Per HPI, otherwise negative  HENT:       Per HPI, otherwise negative  Respiratory:       Per HPI, otherwise negative  Cardiovascular:       Per HPI, otherwise negative  Gastrointestinal: Negative for nausea, vomiting and abdominal pain.  Endocrine:       Negative aside from HPI  Genitourinary:       Neg aside from HPI   Musculoskeletal:       Per HPI, otherwise negative Left knee pain with swelling.   Skin: Negative.   Neurological: Negative for syncope and numbness.      Allergies  Review of patient's allergies indicates no known allergies.  Home Medications   Prior to Admission medications   Medication Sig Start Date End Date Taking? Authorizing Provider  aspirin EC 325 MG tablet Take 1 tablet (325 mg total) by mouth 2 (two) times daily after a meal. 11/16/14   Erlene Senters,  PA-C  esomeprazole (NEXIUM) 20 MG capsule Take 20 mg by mouth daily before breakfast.    Historical Provider, MD  hyoscyamine (LEVBID) 0.375 MG 12 hr tablet TAKE 1 TABLET BY MOUTH TWICE DAILY( EVERY TWELVE HOURS) AS NEEDED 11/28/14   Lauree Chandler, NP  lisinopril (PRINIVIL,ZESTRIL) 20 MG tablet Take 20 mg by mouth at bedtime.    Historical Provider, MD  lisinopril (PRINIVIL,ZESTRIL) 40 MG tablet Take 40 mg by mouth daily.    Historical Provider, MD  methocarbamol (ROBAXIN) 500 MG tablet Take 1 tablet (500 mg total) by mouth every 8 (eight) hours as needed for muscle spasms. 11/16/14   Erlene Senters, PA-C  Multiple Vitamin (MULTIVITAMIN WITH MINERALS) TABS tablet Take 1 tablet by mouth daily.    Historical Provider, MD  nebivolol (BYSTOLIC) 5 MG tablet Take 5 mg by mouth every morning.    Historical Provider, MD  Omega 3 1000 MG CAPS Take 1,000 mg by mouth daily.    Historical Provider, MD   oxyCODONE-acetaminophen (PERCOCET/ROXICET) 5-325 MG per tablet Take one tablet by mouth every 4 hours as needed for mild pain; Take two tablets by mouth every 4 hours as needed for severe pain 11/24/14   Tiffany L Reed, DO  rosuvastatin (CRESTOR) 40 MG tablet Take 40 mg by mouth every evening.    Historical Provider, MD   BP 130/81 mmHg  Pulse 91  Temp(Src) 97.4 F (36.3 C) (Oral)  Resp 20  Ht 6\' 3"  (1.905 m)  Wt 200 lb (90.719 kg)  BMI 25.00 kg/m2  SpO2 96% Physical Exam  Constitutional: He is oriented to person, place, and time. He appears well-developed. No distress.  HENT:  Head: Normocephalic and atraumatic.  Eyes: Conjunctivae and EOM are normal.  Cardiovascular: Normal rate and regular rhythm.   Pulmonary/Chest: Effort normal. No stridor. No respiratory distress.  Abdominal: He exhibits no distension.  Musculoskeletal: He exhibits edema and tenderness.  Left knee- edematis, warm to touch, non erythematous. Tender on medial aspect. Well-healed anterior midline surgical scar. Able to flex knee 90 degrees.   Neurological: He is alert and oriented to person, place, and time.  Skin: Skin is warm and dry. No erythema.  Psychiatric: He has a normal mood and affect.  Nursing note and vitals reviewed.   ED Course  Procedures (including critical care time) Labs Review Labs Reviewed  COMPREHENSIVE METABOLIC PANEL - Abnormal; Notable for the following:    Glucose, Bld 126 (*)    Total Protein 5.8 (*)    GFR calc non Af Amer 85 (*)    All other components within normal limits    Imaging Review [place] Extrem Low Left Ltd  12/23/2014   CLINICAL DATA:  Status post LEFT knee arthroplasty November 16, 2014, knee pain. Assess for abscess or effusion.  EXAM: ULTRASOUND LEFT LOWER EXTREMITY LIMITED  TECHNIQUE: Ultrasound examination of the lower extremity soft tissues was performed in the area of clinical concern.  COMPARISON:  None available for comparison at time of study interpretation.   FINDINGS: Diffuse interstitial edema/ free fluid without focal fluid collection.  IMPRESSION: Interstitial edema without focal fluid collection.   Electronically Signed   By: Elon Alas   On: 12/23/2014 03:15   Medications  fentaNYL (SUBLIMAZE) injection 50 mcg (not administered)  sodium chloride 0.9 % bolus 500 mL (0 mLs Intravenous Stopped 12/23/14 0201)  fentaNYL (SUBLIMAZE) injection 50 mcg (50 mcg Intravenous Given 12/23/14 0032)    12:18 AM- Treatment plan was discussed with patient  who verbalizes understanding and agrees.   4:06 AM Patient back from ultrasound. All results discussed with him and his wife. No new complaints. Patient's pain is returning.  I discussed the patient's case with our orthopedic colleagues. We discussed appropriate follow-up, and tomorrow morning the patient will receive a call from the orthopedists to ensure resolution. MDM   She presents with pain, swelling about his left knee now one month following knee replacement. Patient has effusion, though the suspicion for septic arthritis is low. There is some concern for hemarthrosis, as the patient has had this once following his procedure. Patient's symptoms were well-controlled with analgesics, and after discussion with his orthopedic team, he was discharged in stable condition to follow-up with them in the office.  I personally performed the services described in this documentation, which was scribed in my presence. The recorded information has been reviewed and is accurate.   Carmin Muskrat, MD 12/23/14 [phone]

## 2014-12-23 NOTE — Discharge Instructions (Signed)
As discussed, your evaluation has been largely reassuring.  It is important that you monitor your condition carefully, and do not hesitate to return here if you develop new, or concerning changes in your condition,. In the interim, until you have seen the orthopedist, please keep your knee rested, iced, and elevated if possible.

## 2014-12-23 NOTE — ED Notes (Signed)
Gave pt water, per RN

## 2014-12-23 NOTE — ED Notes (Signed)
Dr. Lockwood is at the bedside.  

## 2014-12-23 NOTE — ED Notes (Signed)
Reviewed results of Ultrasound with Dr. Vanita Panda. No new orders at this time.

## 2014-12-27 DIAGNOSIS — Z471 Aftercare following joint replacement surgery: Secondary | ICD-10-CM | POA: Diagnosis not present

## 2014-12-27 DIAGNOSIS — Z96652 Presence of left artificial knee joint: Secondary | ICD-10-CM | POA: Diagnosis not present

## 2014-12-28 DIAGNOSIS — M25562 Pain in left knee: Secondary | ICD-10-CM | POA: Diagnosis not present

## 2014-12-28 DIAGNOSIS — M6281 Muscle weakness (generalized): Secondary | ICD-10-CM | POA: Diagnosis not present

## 2014-12-28 DIAGNOSIS — M25662 Stiffness of left knee, not elsewhere classified: Secondary | ICD-10-CM | POA: Diagnosis not present

## 2014-12-28 DIAGNOSIS — Z96652 Presence of left artificial knee joint: Secondary | ICD-10-CM | POA: Diagnosis not present

## 2014-12-31 ENCOUNTER — Other Ambulatory Visit: Payer: Self-pay | Admitting: Orthopedic Surgery

## 2015-01-03 DIAGNOSIS — M25662 Stiffness of left knee, not elsewhere classified: Secondary | ICD-10-CM | POA: Diagnosis not present

## 2015-01-03 DIAGNOSIS — Z96652 Presence of left artificial knee joint: Secondary | ICD-10-CM | POA: Diagnosis not present

## 2015-01-03 DIAGNOSIS — M6281 Muscle weakness (generalized): Secondary | ICD-10-CM | POA: Diagnosis not present

## 2015-01-03 DIAGNOSIS — M25562 Pain in left knee: Secondary | ICD-10-CM | POA: Diagnosis not present

## 2015-01-05 DIAGNOSIS — Z96652 Presence of left artificial knee joint: Secondary | ICD-10-CM | POA: Diagnosis not present

## 2015-01-05 DIAGNOSIS — M25562 Pain in left knee: Secondary | ICD-10-CM | POA: Diagnosis not present

## 2015-01-05 DIAGNOSIS — M6281 Muscle weakness (generalized): Secondary | ICD-10-CM | POA: Diagnosis not present

## 2015-01-05 DIAGNOSIS — M25662 Stiffness of left knee, not elsewhere classified: Secondary | ICD-10-CM | POA: Diagnosis not present

## 2015-01-10 DIAGNOSIS — M25662 Stiffness of left knee, not elsewhere classified: Secondary | ICD-10-CM | POA: Diagnosis not present

## 2015-01-10 DIAGNOSIS — M6281 Muscle weakness (generalized): Secondary | ICD-10-CM | POA: Diagnosis not present

## 2015-01-10 DIAGNOSIS — Z96652 Presence of left artificial knee joint: Secondary | ICD-10-CM | POA: Diagnosis not present

## 2015-01-10 DIAGNOSIS — M25562 Pain in left knee: Secondary | ICD-10-CM | POA: Diagnosis not present

## 2015-01-13 ENCOUNTER — Encounter (HOSPITAL_COMMUNITY)
Admission: RE | Admit: 2015-01-13 | Discharge: 2015-01-13 | Disposition: A | Discharge status: Home / Self Care | Payer: Medicare HMO | Source: Ambulatory Visit | Attending: Orthopedic Surgery | Admitting: Orthopedic Surgery

## 2015-01-13 ENCOUNTER — Encounter (HOSPITAL_COMMUNITY): Payer: Self-pay

## 2015-01-13 DIAGNOSIS — M179 Osteoarthritis of knee, unspecified: Secondary | ICD-10-CM | POA: Diagnosis not present

## 2015-01-13 DIAGNOSIS — Z01818 Encounter for other preprocedural examination: Secondary | ICD-10-CM | POA: Insufficient documentation

## 2015-01-13 HISTORY — DX: Gastro-esophageal reflux disease without esophagitis: K21.9

## 2015-01-13 LAB — PROTIME-INR
INR: 0.95 (ref 0.00–1.49)
Prothrombin Time: 12.8 seconds (ref 11.6–15.2)

## 2015-01-13 LAB — CBC WITH DIFFERENTIAL/PLATELET
BASOS ABS: 0.1 10*3/uL (ref 0.0–0.1)
Basophils Relative: 1 % (ref 0–1)
Eosinophils Absolute: 0.6 10*3/uL (ref 0.0–0.7)
Eosinophils Relative: 11 % — ABNORMAL HIGH (ref 0–5)
HEMATOCRIT: 44.4 % (ref 39.0–52.0)
HEMOGLOBIN: 14.9 g/dL (ref 13.0–17.0)
LYMPHS ABS: 1.2 10*3/uL (ref 0.7–4.0)
LYMPHS PCT: 21 % (ref 12–46)
MCH: 28.9 pg (ref 26.0–34.0)
MCHC: 33.6 g/dL (ref 30.0–36.0)
MCV: 86 fL (ref 78.0–100.0)
Monocytes Absolute: 0.4 10*3/uL (ref 0.1–1.0)
Monocytes Relative: 8 % (ref 3–12)
NEUTROS ABS: 3.5 10*3/uL (ref 1.7–7.7)
NEUTROS PCT: 59 % (ref 43–77)
PLATELETS: 309 10*3/uL (ref 150–400)
RBC: 5.16 MIL/uL (ref 4.22–5.81)
RDW: 12.7 % (ref 11.5–15.5)
WBC: 5.7 10*3/uL (ref 4.0–10.5)

## 2015-01-13 LAB — COMPREHENSIVE METABOLIC PANEL
ALT: 26 U/L (ref 0–53)
ANION GAP: 8 (ref 5–15)
AST: 27 U/L (ref 0–37)
Albumin: 4.5 g/dL (ref 3.5–5.2)
Alkaline Phosphatase: 91 U/L (ref 39–117)
BUN: 14 mg/dL (ref 6–23)
CALCIUM: 10.4 mg/dL (ref 8.4–10.5)
CO2: 30 mmol/L (ref 19–32)
Chloride: 101 mEq/L (ref 96–112)
Creatinine, Ser: 1.02 mg/dL (ref 0.50–1.35)
GFR calc Af Amer: 87 mL/min — ABNORMAL LOW (ref >90)
GFR calc non Af Amer: 75 mL/min — ABNORMAL LOW (ref >90)
GLUCOSE: 86 mg/dL (ref 70–99)
Potassium: 4.3 mmol/L (ref 3.5–5.1)
Sodium: 139 mmol/L (ref 135–145)
Total Bilirubin: 0.7 mg/dL (ref 0.3–1.2)
Total Protein: 7.9 g/dL (ref 6.0–8.3)

## 2015-01-13 LAB — URINALYSIS, ROUTINE W REFLEX MICROSCOPIC
Bilirubin Urine: NEGATIVE
Glucose, UA: NEGATIVE mg/dL
HGB URINE DIPSTICK: NEGATIVE
KETONES UR: NEGATIVE mg/dL
LEUKOCYTES UA: NEGATIVE
NITRITE: NEGATIVE
PH: 6 (ref 5.0–8.0)
Protein, ur: NEGATIVE mg/dL
Specific Gravity, Urine: 1.022 (ref 1.005–1.030)
UROBILINOGEN UA: 0.2 mg/dL (ref 0.0–1.0)

## 2015-01-13 LAB — TYPE AND SCREEN
ABO/RH(D): A NEG
Antibody Screen: NEGATIVE
WEAK D: POSITIVE

## 2015-01-13 LAB — SURGICAL PCR SCREEN
MRSA, PCR: NEGATIVE
STAPHYLOCOCCUS AUREUS: NEGATIVE

## 2015-01-13 LAB — ABO/RH: ABO/RH(D): A NEG

## 2015-01-13 LAB — APTT: APTT: 30 s (ref 24–37)

## 2015-01-13 NOTE — Pre-Procedure Instructions (Signed)
Jacob Parker  01/13/2015   Your procedure is scheduled on:  Wednesday, Feb. 3  Report to [facility] Main Entrance "A" at 10:15 AM.  Call this number if you have problems the morning of surgery: [phone]                If any questions before surgery please call pre-admission [phone] (8:00am - 4:00pm)   Remember:   Do not eat food or drink liquids after midnight.   Take these medicines the morning of surgery with A SIP OF WATER: tylenol if needed, nexium, hydrocodone or tramadol  if needed,  Bystolic,              Follow up with Dr. Oran Rein when to stop aspirin.             Jan. 27 - stop fish oil, stop any nonsteriodal anti-inflammatory drugs: ibuprofen,advil, motrin, aleve. Stop vitamins.   Do not wear jewelry, make-up or nail polish.  Do not wear lotions, powders, or perfumes. You may wear deodorant.  Do not shave 48 hours prior to surgery. Men may shave face and neck.  Do not bring valuables to the hospital.  [facility] is not responsible                  for any belongings or valuables.               Contacts, dentures or bridgework may not be worn into surgery.  Leave suitcase in the car. After surgery it may be brought to your room.  For patients admitted to the hospital, discharge time is determined by your                treatment team.               Patients discharged the day of surgery will not be allowed to drive  home.  Name and phone number of your driver:   Special Instructions: review preparing for surgery handout   Please read over the following fact sheets that you were given: Pain Booklet, Coughing and Deep Breathing, Blood Transfusion Information, MRSA Information and Surgical Site Infection Prevention

## 2015-01-13 NOTE — Progress Notes (Signed)
Pt. Will check with Dr. Wynonia Lawman  When to stop aspirin. Cath/stress/ekg requested from dr.tilley's office. PCP: dr. Orland Penman

## 2015-01-16 ENCOUNTER — Encounter (HOSPITAL_COMMUNITY): Payer: Self-pay

## 2015-01-16 NOTE — Progress Notes (Signed)
Anesthesia Chart Review:  Patient is a 64 year old male scheduled for right TKA on 01/25/15 by Dr. Berenice Primas.  History includes non-smoker, HTN, HLD, CAD s/p Xience distal RCA and mid RCA 04/13/10, Xience to proximal DIAG 04/16/10 [facility]), skin cancer, OSA with CPAP use, esophageal stricture, GERD, left TKA 11/16/14. PCP is listed as Dr. Doreene Burke Nnodi.  Cardiologist is Dr. Tollie Eth.  Dr. Berenice Primas did not re-request clearance for this surgery, but did have Dr. Wynonia Lawman clear patient prior to his left TKA in 10/2014.    EKG 11/04/14 (Dr. Wynonia Lawman): NSR.  According to Dr. Thurman Coyer 11/04/14 office note (scanned under Media tab), cardiac cath fro 04/13/10 in [place] showed: Normal LM, 50% LAD, 80% proximal DIAG1, 40% mid RCA, 90% distal RCA, LVEF not documented. Patient underwent 3.5 X 23 Xience distal RCA, 3.5 X 28 Xience mid RCA 04/13/10 and Xience 2.5 X 38 to proximal DIAG 04/16/10.   11/08/14 CXR: No acute cardiopulmonary disease.  Preoperative labs noted.   Patient tolerated similar procedure approximately three months ago.  He has known CAD but with cardiology follow-up in 10/2014.  If no acute changes or new CV symptoms then I would anticipate that he could proceed as planned.  If any additional cardiology records received prior to his surgery date then I have asked nursing staff to have me review. (Patient reported his last cardiology testing was from 2011, so I am not anticipating any additional records.)  George Hugh [facility] Stay [facility]/Anesthesiology Phone [phone] 01/16/2015 11:24 AM

## 2015-01-17 DIAGNOSIS — Z96652 Presence of left artificial knee joint: Secondary | ICD-10-CM | POA: Diagnosis not present

## 2015-01-17 DIAGNOSIS — Z471 Aftercare following joint replacement surgery: Secondary | ICD-10-CM | POA: Diagnosis not present

## 2015-01-17 DIAGNOSIS — M25662 Stiffness of left knee, not elsewhere classified: Secondary | ICD-10-CM | POA: Diagnosis not present

## 2015-01-17 DIAGNOSIS — M6281 Muscle weakness (generalized): Secondary | ICD-10-CM | POA: Diagnosis not present

## 2015-01-17 DIAGNOSIS — M25562 Pain in left knee: Secondary | ICD-10-CM | POA: Diagnosis not present

## 2015-01-17 DIAGNOSIS — M1711 Unilateral primary osteoarthritis, right knee: Secondary | ICD-10-CM | POA: Diagnosis not present

## 2015-01-22 MED ORDER — CEFAZOLIN SODIUM-DEXTROSE 2-3 GM-% IV SOLR
2.0000 g | INTRAVENOUS | Status: AC
  Administered 2015-01-23: 2 g via INTRAVENOUS
  Filled 2015-01-22: qty 50

## 2015-01-23 ENCOUNTER — Encounter (HOSPITAL_COMMUNITY)
Admission: RE | Disposition: A | Discharge status: Home Health | Payer: Self-pay | Source: Ambulatory Visit | Attending: Orthopedic Surgery

## 2015-01-23 ENCOUNTER — Inpatient Hospital Stay (HOSPITAL_COMMUNITY): Payer: Medicare HMO | Admitting: Certified Registered"

## 2015-01-23 ENCOUNTER — Encounter (HOSPITAL_COMMUNITY): Payer: Self-pay | Admitting: Certified Registered Nurse Anesthetist

## 2015-01-23 ENCOUNTER — Inpatient Hospital Stay (HOSPITAL_COMMUNITY): Payer: Medicare HMO | Admitting: Vascular Surgery

## 2015-01-23 ENCOUNTER — Inpatient Hospital Stay (HOSPITAL_COMMUNITY)
Admission: RE | Admit: 2015-01-23 | Discharge: 2015-01-25 | DRG: 470 | Disposition: A | Discharge status: Home Health | Payer: Medicare HMO | Source: Ambulatory Visit | Attending: Orthopedic Surgery | Admitting: Orthopedic Surgery

## 2015-01-23 DIAGNOSIS — I10 Essential (primary) hypertension: Secondary | ICD-10-CM | POA: Diagnosis not present

## 2015-01-23 DIAGNOSIS — Z7982 Long term (current) use of aspirin: Secondary | ICD-10-CM | POA: Diagnosis not present

## 2015-01-23 DIAGNOSIS — Z96652 Presence of left artificial knee joint: Secondary | ICD-10-CM | POA: Diagnosis not present

## 2015-01-23 DIAGNOSIS — M1711 Unilateral primary osteoarthritis, right knee: Secondary | ICD-10-CM | POA: Diagnosis not present

## 2015-01-23 DIAGNOSIS — M179 Osteoarthritis of knee, unspecified: Secondary | ICD-10-CM | POA: Diagnosis not present

## 2015-01-23 DIAGNOSIS — Z955 Presence of coronary angioplasty implant and graft: Secondary | ICD-10-CM

## 2015-01-23 DIAGNOSIS — Z96651 Presence of right artificial knee joint: Secondary | ICD-10-CM | POA: Diagnosis not present

## 2015-01-23 DIAGNOSIS — G4730 Sleep apnea, unspecified: Secondary | ICD-10-CM | POA: Diagnosis present

## 2015-01-23 DIAGNOSIS — M1712 Unilateral primary osteoarthritis, left knee: Secondary | ICD-10-CM | POA: Diagnosis present

## 2015-01-23 DIAGNOSIS — I2510 Atherosclerotic heart disease of native coronary artery without angina pectoris: Secondary | ICD-10-CM | POA: Diagnosis present

## 2015-01-23 DIAGNOSIS — K219 Gastro-esophageal reflux disease without esophagitis: Secondary | ICD-10-CM | POA: Diagnosis present

## 2015-01-23 DIAGNOSIS — M25561 Pain in right knee: Secondary | ICD-10-CM | POA: Diagnosis present

## 2015-01-23 DIAGNOSIS — Z85828 Personal history of other malignant neoplasm of skin: Secondary | ICD-10-CM

## 2015-01-23 DIAGNOSIS — E785 Hyperlipidemia, unspecified: Secondary | ICD-10-CM | POA: Diagnosis not present

## 2015-01-23 HISTORY — DX: Unilateral primary osteoarthritis, right knee: M17.11

## 2015-01-23 HISTORY — PX: TOTAL KNEE ARTHROPLASTY: SHX125

## 2015-01-23 SURGERY — ARTHROPLASTY, KNEE, TOTAL
Anesthesia: Monitor Anesthesia Care | Laterality: Right

## 2015-01-23 MED ORDER — ONDANSETRON HCL 4 MG/2ML IJ SOLN: 4.0000 mg | Freq: Four times a day (QID) | INTRAMUSCULAR | Status: DC | PRN

## 2015-01-23 MED ORDER — ROSUVASTATIN CALCIUM 40 MG PO TABS
40.0000 mg | ORAL_TABLET | Freq: Every day | ORAL | Status: DC
  Administered 2015-01-23 – 2015-01-24 (×2): 40 mg via ORAL
  Filled 2015-01-23 (×3): qty 1

## 2015-01-23 MED ORDER — OXYCODONE HCL ER 10 MG PO T12A
20.0000 mg | EXTENDED_RELEASE_TABLET | Freq: Two times a day (BID) | ORAL | Status: DC
  Administered 2015-01-23 – 2015-01-25 (×4): 20 mg via ORAL
  Filled 2015-01-23 (×4): qty 2

## 2015-01-23 MED ORDER — ROCURONIUM BROMIDE 50 MG/5ML IV SOLN
INTRAVENOUS | Status: AC
  Filled 2015-01-23: qty 1

## 2015-01-23 MED ORDER — METHOCARBAMOL 500 MG PO TABS
500.0000 mg | ORAL_TABLET | Freq: Four times a day (QID) | ORAL | Status: DC | PRN
  Administered 2015-01-23 – 2015-01-25 (×6): 500 mg via ORAL
  Filled 2015-01-23 (×6): qty 1

## 2015-01-23 MED ORDER — ASPIRIN EC 325 MG PO TBEC
325.0000 mg | DELAYED_RELEASE_TABLET | Freq: Two times a day (BID) | ORAL | Status: DC
  Administered 2015-01-23 – 2015-01-25 (×4): 325 mg via ORAL
  Filled 2015-01-23 (×6): qty 1

## 2015-01-23 MED ORDER — LACTATED RINGERS IV SOLN
INTRAVENOUS | Status: DC
  Administered 2015-01-23: 10:00:00 via INTRAVENOUS

## 2015-01-23 MED ORDER — ONDANSETRON HCL 4 MG/2ML IJ SOLN
INTRAMUSCULAR | Status: DC | PRN
  Administered 2015-01-23: 4 mg via INTRAVENOUS

## 2015-01-23 MED ORDER — FENTANYL CITRATE 0.05 MG/ML IJ SOLN
INTRAMUSCULAR | Status: DC | PRN
Start: 2015-01-23 — End: 2015-01-23
  Administered 2015-01-23 (×2): 50 ug via INTRAVENOUS
  Administered 2015-01-23 (×2): 25 ug via INTRAVENOUS

## 2015-01-23 MED ORDER — HYDROMORPHONE HCL 1 MG/ML IJ SOLN
INTRAMUSCULAR | Status: AC
  Administered 2015-01-23: 0.5 mg via INTRAVENOUS
  Filled 2015-01-23: qty 1

## 2015-01-23 MED ORDER — HYDROMORPHONE HCL 1 MG/ML IJ SOLN: 1.0000 mg | INTRAMUSCULAR | Status: DC | PRN

## 2015-01-23 MED ORDER — 0.9 % SODIUM CHLORIDE (POUR BTL) OPTIME
TOPICAL | Status: DC | PRN
  Administered 2015-01-23: 1000 mL

## 2015-01-23 MED ORDER — NEOSTIGMINE METHYLSULFATE 10 MG/10ML IV SOLN
INTRAVENOUS | Status: AC
  Filled 2015-01-23: qty 1

## 2015-01-23 MED ORDER — PROPOFOL INFUSION 10 MG/ML OPTIME
INTRAVENOUS | Status: DC | PRN
  Administered 2015-01-23: 75 ug/kg/min via INTRAVENOUS

## 2015-01-23 MED ORDER — MIDAZOLAM HCL 2 MG/2ML IJ SOLN
0.5000 mg | Freq: Once | INTRAMUSCULAR | Status: DC | PRN
Start: 2015-01-23 — End: 2015-01-23

## 2015-01-23 MED ORDER — METHOCARBAMOL 1000 MG/10ML IJ SOLN
500.0000 mg | Freq: Four times a day (QID) | INTRAVENOUS | Status: DC | PRN
  Filled 2015-01-23: qty 5

## 2015-01-23 MED ORDER — FLEET ENEMA 7-19 GM/118ML RE ENEM: 1.0000 | ENEMA | Freq: Once | RECTAL | Status: AC | PRN

## 2015-01-23 MED ORDER — PROMETHAZINE HCL 25 MG/ML IJ SOLN
6.2500 mg | INTRAMUSCULAR | Status: DC | PRN
Start: 2015-01-23 — End: 2015-01-23

## 2015-01-23 MED ORDER — DOCUSATE SODIUM 100 MG PO CAPS
100.0000 mg | ORAL_CAPSULE | Freq: Two times a day (BID) | ORAL | Status: DC
  Administered 2015-01-23 – 2015-01-25 (×4): 100 mg via ORAL
  Filled 2015-01-23 (×4): qty 1

## 2015-01-23 MED ORDER — PROPOFOL 10 MG/ML IV BOLUS
INTRAVENOUS | Status: AC
  Filled 2015-01-23: qty 20

## 2015-01-23 MED ORDER — POLYETHYLENE GLYCOL 3350 17 G PO PACK
17.0000 g | PACK | Freq: Every day | ORAL | Status: DC | PRN
  Administered 2015-01-24 – 2015-01-25 (×2): 17 g via ORAL
  Filled 2015-01-23 (×2): qty 1

## 2015-01-23 MED ORDER — ONDANSETRON HCL 4 MG/2ML IJ SOLN
INTRAMUSCULAR | Status: AC
  Filled 2015-01-23: qty 2

## 2015-01-23 MED ORDER — CHLORHEXIDINE GLUCONATE 4 % EX LIQD
60.0000 mL | Freq: Once | CUTANEOUS | Status: DC
  Filled 2015-01-23: qty 60

## 2015-01-23 MED ORDER — TRANEXAMIC ACID 100 MG/ML IV SOLN
2000.0000 mg | Freq: Once | INTRAVENOUS | Status: AC
  Administered 2015-01-23: 2000 mg via TOPICAL
  Filled 2015-01-23: qty 20

## 2015-01-23 MED ORDER — MIDAZOLAM HCL 2 MG/2ML IJ SOLN
INTRAMUSCULAR | Status: AC
  Filled 2015-01-23: qty 2

## 2015-01-23 MED ORDER — ONDANSETRON HCL 4 MG PO TABS: 4.0000 mg | ORAL_TABLET | Freq: Four times a day (QID) | ORAL | Status: DC | PRN

## 2015-01-23 MED ORDER — GLYCOPYRROLATE 0.2 MG/ML IJ SOLN
INTRAMUSCULAR | Status: AC
  Filled 2015-01-23: qty 3

## 2015-01-23 MED ORDER — DEXAMETHASONE SODIUM PHOSPHATE 10 MG/ML IJ SOLN
INTRAMUSCULAR | Status: DC | PRN
  Administered 2015-01-23: 10 mg via INTRAVENOUS

## 2015-01-23 MED ORDER — ACETAMINOPHEN 325 MG PO TABS: 650.0000 mg | ORAL_TABLET | Freq: Four times a day (QID) | ORAL | Status: DC | PRN

## 2015-01-23 MED ORDER — DIPHENHYDRAMINE HCL 12.5 MG/5ML PO ELIX: 12.5000 mg | ORAL_SOLUTION | ORAL | Status: DC | PRN

## 2015-01-23 MED ORDER — ZOLPIDEM TARTRATE 5 MG PO TABS
5.0000 mg | ORAL_TABLET | Freq: Every evening | ORAL | Status: DC | PRN
  Administered 2015-01-23 – 2015-01-24 (×2): 5 mg via ORAL
  Filled 2015-01-23 (×2): qty 1

## 2015-01-23 MED ORDER — PANTOPRAZOLE SODIUM 40 MG PO TBEC
40.0000 mg | DELAYED_RELEASE_TABLET | Freq: Every day | ORAL | Status: DC
  Administered 2015-01-25: 40 mg via ORAL
  Filled 2015-01-23: qty 1

## 2015-01-23 MED ORDER — PROMETHAZINE HCL 25 MG/ML IJ SOLN: 12.5000 mg | Freq: Four times a day (QID) | INTRAMUSCULAR | Status: DC | PRN

## 2015-01-23 MED ORDER — LIDOCAINE HCL (CARDIAC) 20 MG/ML IV SOLN
INTRAVENOUS | Status: DC | PRN
  Administered 2015-01-23: 20 mg via INTRAVENOUS

## 2015-01-23 MED ORDER — ALUM & MAG HYDROXIDE-SIMETH 200-200-20 MG/5ML PO SUSP: 30.0000 mL | ORAL | Status: DC | PRN

## 2015-01-23 MED ORDER — ASPIRIN EC 325 MG PO TBEC: 325.0000 mg | DELAYED_RELEASE_TABLET | Freq: Two times a day (BID) | ORAL | Status: DC

## 2015-01-23 MED ORDER — BISACODYL 5 MG PO TBEC: 5.0000 mg | DELAYED_RELEASE_TABLET | Freq: Every day | ORAL | Status: DC | PRN

## 2015-01-23 MED ORDER — LACTATED RINGERS IV SOLN
INTRAVENOUS | Status: DC | PRN
  Administered 2015-01-23 (×3): via INTRAVENOUS

## 2015-01-23 MED ORDER — TIZANIDINE HCL 2 MG PO TABS: 2.0000 mg | ORAL_TABLET | Freq: Four times a day (QID) | ORAL | Status: DC | PRN

## 2015-01-23 MED ORDER — OXYCODONE-ACETAMINOPHEN 5-325 MG PO TABS: 1.0000 | ORAL_TABLET | ORAL | Status: DC | PRN

## 2015-01-23 MED ORDER — MEPERIDINE HCL 25 MG/ML IJ SOLN: 6.2500 mg | INTRAMUSCULAR | Status: DC | PRN

## 2015-01-23 MED ORDER — HYDROMORPHONE HCL 1 MG/ML IJ SOLN
0.2500 mg | INTRAMUSCULAR | Status: DC | PRN
  Administered 2015-01-23 (×4): 0.5 mg via INTRAVENOUS

## 2015-01-23 MED ORDER — NEBIVOLOL HCL 5 MG PO TABS
5.0000 mg | ORAL_TABLET | Freq: Every day | ORAL | Status: DC
  Administered 2015-01-24 – 2015-01-25 (×2): 5 mg via ORAL
  Filled 2015-01-23 (×2): qty 1

## 2015-01-23 MED ORDER — LIDOCAINE HCL (CARDIAC) 20 MG/ML IV SOLN
INTRAVENOUS | Status: AC
  Filled 2015-01-23: qty 5

## 2015-01-23 MED ORDER — BUPIVACAINE HCL (PF) 0.5 % IJ SOLN
INTRAMUSCULAR | Status: DC | PRN
  Administered 2015-01-23: 20 mL

## 2015-01-23 MED ORDER — LISINOPRIL 20 MG PO TABS
20.0000 mg | ORAL_TABLET | Freq: Every day | ORAL | Status: DC
  Administered 2015-01-23 – 2015-01-24 (×2): 20 mg via ORAL
  Filled 2015-01-23 (×3): qty 1

## 2015-01-23 MED ORDER — MIDAZOLAM HCL 5 MG/5ML IJ SOLN
INTRAMUSCULAR | Status: DC | PRN
  Administered 2015-01-23 (×4): 1 mg via INTRAVENOUS

## 2015-01-23 MED ORDER — BUPIVACAINE LIPOSOME 1.3 % IJ SUSP
20.0000 mL | INTRAMUSCULAR | Status: AC
  Administered 2015-01-23: 20 mL
  Filled 2015-01-23: qty 20

## 2015-01-23 MED ORDER — ACETAMINOPHEN 650 MG RE SUPP: 650.0000 mg | Freq: Four times a day (QID) | RECTAL | Status: DC | PRN

## 2015-01-23 MED ORDER — FENTANYL CITRATE 0.05 MG/ML IJ SOLN
INTRAMUSCULAR | Status: DC
Start: 2015-01-23 — End: 2015-01-23
  Filled 2015-01-23: qty 2

## 2015-01-23 MED ORDER — BUPIVACAINE IN DEXTROSE 0.75-8.25 % IT SOLN
INTRATHECAL | Status: DC | PRN
  Administered 2015-01-23: 15 mg via INTRATHECAL

## 2015-01-23 MED ORDER — CEFAZOLIN SODIUM-DEXTROSE 2-3 GM-% IV SOLR
2.0000 g | Freq: Four times a day (QID) | INTRAVENOUS | Status: AC
  Administered 2015-01-23 – 2015-01-24 (×2): 2 g via INTRAVENOUS
  Filled 2015-01-23 (×2): qty 50

## 2015-01-23 MED ORDER — FENTANYL CITRATE 0.05 MG/ML IJ SOLN
INTRAMUSCULAR | Status: AC
  Filled 2015-01-23: qty 5

## 2015-01-23 MED ORDER — OXYCODONE-ACETAMINOPHEN 5-325 MG PO TABS
1.0000 | ORAL_TABLET | ORAL | Status: DC | PRN
  Administered 2015-01-23 – 2015-01-25 (×10): 2 via ORAL
  Filled 2015-01-23 (×10): qty 2

## 2015-01-23 MED ORDER — SODIUM CHLORIDE 0.9 % IV SOLN
INTRAVENOUS | Status: DC
  Administered 2015-01-23: 20:00:00 via INTRAVENOUS

## 2015-01-23 SURGICAL SUPPLY — 62 items
BANDAGE ESMARK 6X9 LF (GAUZE/BANDAGES/DRESSINGS) ×1 IMPLANT
BENZOIN TINCTURE PRP APPL 2/3 (GAUZE/BANDAGES/DRESSINGS) ×3 IMPLANT
BLADE SAGITTAL 25.0X1.19X90 (BLADE) ×2 IMPLANT
BLADE SAGITTAL 25.0X1.19X90MM (BLADE) ×1
BLADE SAW SAG 90X13X1.27 (BLADE) ×3 IMPLANT
BNDG ESMARK 6X9 LF (GAUZE/BANDAGES/DRESSINGS) ×3
BOWL SMART MIX CTS (DISPOSABLE) ×3 IMPLANT
CAP KNEE TOTAL 3 SIGMA ×3 IMPLANT
CEMENT HV SMART SET (Cement) ×6 IMPLANT
CLOSURE WOUND 1/2 X4 (GAUZE/BANDAGES/DRESSINGS) ×1
COVER SURGICAL LIGHT HANDLE (MISCELLANEOUS) ×6 IMPLANT
CUFF TOURNIQUET SINGLE 34IN LL (TOURNIQUET CUFF) ×3 IMPLANT
DRAPE EXTREMITY T 121X128X90 (DRAPE) ×3 IMPLANT
DRAPE IMP U-DRAPE 54X76 (DRAPES) ×3 IMPLANT
DRAPE U-SHAPE 47X51 STRL (DRAPES) ×3 IMPLANT
DURAPREP 26ML APPLICATOR (WOUND CARE) ×6 IMPLANT
ELECT REM PT RETURN 9FT ADLT (ELECTROSURGICAL) ×3
ELECTRODE REM PT RTRN 9FT ADLT (ELECTROSURGICAL) ×1 IMPLANT
EVACUATOR 1/8 PVC DRAIN (DRAIN) ×3 IMPLANT
FACESHIELD WRAPAROUND (MASK) ×3 IMPLANT
GAUZE SPONGE 4X4 12PLY STRL (GAUZE/BANDAGES/DRESSINGS) ×3 IMPLANT
GAUZE XEROFORM 5X9 LF (GAUZE/BANDAGES/DRESSINGS) ×3 IMPLANT
GLOVE BIOGEL PI IND STRL 6 (GLOVE) ×1 IMPLANT
GLOVE BIOGEL PI IND STRL 6.5 (GLOVE) ×2 IMPLANT
GLOVE BIOGEL PI IND STRL 7.0 (GLOVE) ×1 IMPLANT
GLOVE BIOGEL PI IND STRL 8 (GLOVE) ×2 IMPLANT
GLOVE BIOGEL PI INDICATOR 6 (GLOVE) ×2
GLOVE BIOGEL PI INDICATOR 6.5 (GLOVE) ×4
GLOVE BIOGEL PI INDICATOR 7.0 (GLOVE) ×2
GLOVE BIOGEL PI INDICATOR 8 (GLOVE) ×4
GLOVE ECLIPSE 7.5 STRL STRAW (GLOVE) ×6 IMPLANT
GOWN STRL REUS W/ TWL LRG LVL3 (GOWN DISPOSABLE) ×2 IMPLANT
GOWN STRL REUS W/ TWL XL LVL3 (GOWN DISPOSABLE) ×2 IMPLANT
GOWN STRL REUS W/TWL LRG LVL3 (GOWN DISPOSABLE) ×4
GOWN STRL REUS W/TWL XL LVL3 (GOWN DISPOSABLE) ×4
HANDPIECE INTERPULSE COAX TIP (DISPOSABLE) ×2
HOOD PEEL AWAY FACE SHEILD DIS (HOOD) ×9 IMPLANT
IMMOBILIZER KNEE 22 UNIV (SOFTGOODS) ×3 IMPLANT
KIT BASIN OR (CUSTOM PROCEDURE TRAY) ×3 IMPLANT
KIT ROOM TURNOVER OR (KITS) ×3 IMPLANT
MANIFOLD NEPTUNE II (INSTRUMENTS) ×3 IMPLANT
NEEDLE SPNL 22GX3.5 QUINCKE BK (NEEDLE) ×3 IMPLANT
NS IRRIG 1000ML POUR BTL (IV SOLUTION) ×3 IMPLANT
PACK TOTAL JOINT (CUSTOM PROCEDURE TRAY) ×3 IMPLANT
PACK UNIVERSAL I (CUSTOM PROCEDURE TRAY) ×3 IMPLANT
PAD ARMBOARD 7.5X6 YLW CONV (MISCELLANEOUS) ×6 IMPLANT
PAD CAST 4YDX4 CTTN HI CHSV (CAST SUPPLIES) ×1 IMPLANT
PADDING CAST COTTON 4X4 STRL (CAST SUPPLIES) ×2
SET HNDPC FAN SPRY TIP SCT (DISPOSABLE) ×1 IMPLANT
STAPLER VISISTAT 35W (STAPLE) IMPLANT
STRIP CLOSURE SKIN 1/2X4 (GAUZE/BANDAGES/DRESSINGS) ×2 IMPLANT
SUCTION FRAZIER TIP 10 FR DISP (SUCTIONS) ×3 IMPLANT
SUT MNCRL AB 3-0 PS2 18 (SUTURE) IMPLANT
SUT VIC AB 0 CTB1 27 (SUTURE) ×6 IMPLANT
SUT VIC AB 1 CT1 27 (SUTURE) ×4
SUT VIC AB 1 CT1 27XBRD ANBCTR (SUTURE) ×2 IMPLANT
SUT VIC AB 2-0 CTB1 (SUTURE) ×6 IMPLANT
SYR 50ML LL SCALE MARK (SYRINGE) ×3 IMPLANT
TOWEL OR 17X24 6PK STRL BLUE (TOWEL DISPOSABLE) ×3 IMPLANT
TOWEL OR 17X26 10 PK STRL BLUE (TOWEL DISPOSABLE) ×3 IMPLANT
TRAY FOLEY CATH 16FRSI W/METER (SET/KITS/TRAYS/PACK) ×3 IMPLANT
YANKAUER SUCT BULB TIP NO VENT (SUCTIONS) ×3 IMPLANT

## 2015-01-23 NOTE — Anesthesia Procedure Notes (Signed)
Spinal Patient location during procedure: OR Start time: 01/23/2015 12:24 PM End time: 01/23/2015 12:25 PM Staffing Anesthesiologist: Midge Minium Performed by: anesthesiologist  Preanesthetic Checklist Completed: patient identified, site marked, surgical consent, pre-op evaluation, timeout performed, IV checked, risks and benefits discussed and monitors and equipment checked Spinal Block Patient position: sitting Prep: Betadine and site prepped and draped Patient monitoring: heart rate, cardiac monitor, continuous pulse ox and blood pressure Approach: midline Location: L3-4 Injection technique: single-shot Needle Needle type: Quincke  Needle gauge: 25 G Needle length: 5 cm Additional Notes Bupivacaine 15mg  with dextrose, clear CSP aspirated before and after injection, VSS, pt tolerated well  Jenita Seashore, MD

## 2015-01-23 NOTE — Anesthesia Preprocedure Evaluation (Addendum)
Anesthesia Evaluation  Patient identified by MRN, date of birth, ID band Patient awake    Reviewed: Allergy & Precautions, NPO status , Patient's Chart, lab work & pertinent test results, reviewed documented beta blocker date and time   History of Anesthesia Complications Negative for: history of anesthetic complications  Airway Mallampati: II  TM Distance: >3 FB Neck ROM: Full    Dental  (+) Dental Advisory Given   Pulmonary neg pulmonary ROS, sleep apnea and Continuous Positive Airway Pressure Ventilation ,  breath sounds clear to auscultation        Cardiovascular hypertension, Pt. on medications and Pt. on home beta blockers - angina+ CAD and + Cardiac Stents Rhythm:Regular Rate:Normal  Stent placed '11 in [place], no EF recorded, no h/o MI and denies CP since stent placement, "cleared" by Dr. Wynonia Lawman without further testing   Neuro/Psych negative neurological ROS     GI/Hepatic Neg liver ROS, GERD-  Medicated and Controlled,  Endo/Other  negative endocrine ROS  Renal/GU negative Renal ROS     Musculoskeletal  (+) Arthritis -, Osteoarthritis,    Abdominal   Peds  Hematology   Anesthesia Other Findings   Reproductive/Obstetrics                          Anesthesia Physical Anesthesia Plan  ASA: III  Anesthesia Plan: Spinal and MAC   Post-op Pain Management:    Induction: Intravenous  Airway Management Planned:   Additional Equipment:   Intra-op Plan:   Post-operative Plan:   Informed Consent: I have reviewed the patients History and Physical, chart, labs and discussed the procedure including the risks, benefits and alternatives for the proposed anesthesia with the patient or authorized representative who has indicated his/her understanding and acceptance.   Dental advisory given  Plan Discussed with: CRNA and Surgeon  Anesthesia Plan Comments: (Plan routine monitors, SAB)         Anesthesia Quick Evaluation

## 2015-01-23 NOTE — Transfer of Care (Signed)
Immediate Anesthesia Transfer of Care Note  Patient: Jacob Parker  Procedure(s) Performed: Procedure(s): RIGHT TOTAL KNEE ARTHROPLASTY (Right)  Patient Location: PACU  Anesthesia Type:MAC and Spinal  Level of Consciousness: awake, alert  and oriented  Airway & Oxygen Therapy: Patient Spontanous Breathing and Patient connected to nasal cannula oxygen  Post-op Assessment: Report given to RN and Post -op Vital signs reviewed and stable  Post vital signs: Reviewed and stable  Last Vitals:  Filed Vitals:   01/23/15 1022  BP: 122/69  Pulse: 72  Temp: 36 C  Resp: 20    Complications: No apparent anesthesia complications

## 2015-01-23 NOTE — H&P (Signed)
TOTAL KNEE ADMISSION H&P  Patient is being admitted for right total knee arthroplasty.  Subjective:  Chief Complaint:right knee pain.  HPI: Jacob Parker, 64 y.o. male, has a history of pain and functional disability in the right knee due to arthritis and has failed non-surgical conservative treatments for greater than 12 weeks to includeNSAID's and/or analgesics, corticosteriod injections, viscosupplementation injections, use of assistive devices, weight reduction as appropriate and activity modification.  Onset of symptoms was gradual, starting 5 years ago with gradually worsening course since that time. The patient noted no past surgery on the right knee(s).  Patient currently rates pain in the right knee(s) at 8 out of 10 with activity. Patient has night pain, worsening of pain with activity and weight bearing, pain that interferes with activities of daily living, pain with passive range of motion, crepitus and joint swelling.  Patient has evidence of subchondral cysts, periarticular osteophytes, joint subluxation and joint space narrowing by imaging studies. This patient has had failure of all conservative care. There is no active infection.  Patient Active Problem List   Diagnosis Date Noted  . Primary osteoarthritis of left knee 11/16/2014   Past Medical History  Diagnosis Date  . Hypertension   . Hyperlipidemia   . DJD (degenerative joint disease)   . History of skin cancer 1981 / 2015  . Stricture esophagus   . Sleep apnea     uses c pap  . GERD (gastroesophageal reflux disease)   . Coronary artery disease 2011    has 2 stents in [place]; as if 10/2014 followed by Dr. Wynonia Lawman in [facility]     Past Surgical History  Procedure Laterality Date  . Appendectomy    . Vasectomy    . Fracture surgery  1985    l foot fx / l leg  . Hernia repair  1973  . Upper gi endoscopy    . Colonoscopy    . Total knee arthroplasty Left 11/16/2014    Procedure: LEFT TOTAL KNEE ARTHROPLASTY;   Surgeon: Alta Corning, MD;  Location: [facility];  Service: Orthopedics;  Laterality: Left;  . Coronary angioplasty with stent placement  2011     x 2 stents (Xience RCA and DIAG stent 2011, [place])    Prescriptions prior to admission  Medication Sig Dispense Refill Last Dose  . acetaminophen (TYLENOL) 500 MG tablet Take 1,000 mg by mouth every 6 (six) hours as needed (pain).     Marland Kitchen aspirin EC 81 MG tablet Take 81 mg by mouth daily.   01/23/2015 at Unknown time  . esomeprazole (NEXIUM) 20 MG capsule Take 20 mg by mouth daily before breakfast.   01/23/2015 at Unknown time  . HYDROcodone-acetaminophen (NORCO) 10-325 MG per tablet Take 1 tablet by mouth every 6 (six) hours as needed (pain).   Past Week at Unknown time  . lisinopril (PRINIVIL,ZESTRIL) 20 MG tablet Take 20 mg by mouth at bedtime.   01/22/2015 at Unknown time  . Multiple Vitamin (MULTIVITAMIN WITH MINERALS) TABS tablet Take 1 tablet by mouth daily.   01/22/2015 at Unknown time  . nebivolol (BYSTOLIC) 5 MG tablet Take 5 mg by mouth daily.    01/23/2015 at Unknown time  . Omega-3 Fatty Acids (FISH OIL) 1000 MG CAPS Take 1,000 mg by mouth daily.   Past Week at Unknown time  . oxyCODONE-acetaminophen (PERCOCET/ROXICET) 5-325 MG per tablet Take 1-2 tablets by mouth every 6 (six) hours as needed (pain).   0   . rosuvastatin (CRESTOR) 40 MG  tablet Take 40 mg by mouth at bedtime.    01/22/2015 at Unknown time  . traMADol (ULTRAM) 50 MG tablet Take 50 mg by mouth 3 (three) times daily as needed for moderate pain.    not taking  . aspirin EC 325 MG tablet Take 1 tablet (325 mg total) by mouth 2 (two) times daily after a meal. (Patient not taking: Reported on 01/11/2015) 60 tablet 0 Not Taking at Unknown time  . fentaNYL (DURAGESIC) 25 MCG/HR patch Place 1 patch (25 mcg total) onto the skin every 3 (three) days. (Patient not taking: Reported on 01/11/2015) 3 patch 0 Not Taking at Unknown time  . hyoscyamine (LEVBID) 0.375 MG 12 hr tablet TAKE 1 TABLET BY MOUTH  TWICE DAILY( EVERY TWELVE HOURS) AS NEEDED (Patient not taking: Reported on 12/23/2014) 180 tablet 0 Not Taking at Unknown time   No Known Allergies  History  Substance Use Topics  . Smoking status: Never Smoker   . Smokeless tobacco: Not on file  . Alcohol Use: 1.5 oz/week    3 Not specified per week     Comment: occasional    No family history on file.   ROS ROS: I have reviewed the patient's review of systems thoroughly and there are no positive responses as relates to the HPI. Objective:  Physical Exam  Vital signs in last 24 hours: Temp:  [96.8 F (36 C)] 96.8 F (36 C) (02/01 1022) Pulse Rate:  [72] 72 (02/01 1022) Resp:  [20] 20 (02/01 1022) BP: (122)/(69) 122/69 mmHg (02/01 1022) SpO2:  [100 %] 100 % (02/01 1022) Weight:  [194 lb 14 oz (88.395 kg)] 194 lb 14 oz (88.395 kg) (02/01 1022) Well-developed well-nourished patient in no acute distress. Alert and oriented x3 HEENT:within normal limits Cardiac: Regular rate and rhythm Pulmonary: Lungs clear to auscultation Abdomen: Soft and nontender.  Normal active bowel sounds  Musculoskeletal: (R knee: painful rom no instability//+ med jt line tender// rom5-95 Labs: Recent Results (from the past 2160 hour(s))  Surgical pcr screen     Status: Abnormal   Collection Time: 11/08/14  9:18 AM  Result Value Ref Range   MRSA, PCR INVALID RESULTS, SPECIMEN SENT FOR CULTURE (A) NEGATIVE    Comment: Franz Dell RN AT 1025 ON 11.17.15 BY SHUEA   Staphylococcus aureus INVALID RESULTS, SPECIMEN SENT FOR CULTURE (A) NEGATIVE    Comment: M. FAISON RN AT 4174 ON 11.17.15 BY SHUEA        The Xpert SA Assay (FDA approved for NASAL specimens in patients over 77 years of age), is one component of a comprehensive surveillance program.  Test performance has been validated by [employer] for patients greater than or equal to 15 year old. It is not intended to diagnose infection nor to guide or monitor treatment.   Urinalysis, Routine w  reflex microscopic     Status: Abnormal   Collection Time: 11/08/14  9:18 AM  Result Value Ref Range   Color, Urine YELLOW YELLOW   APPearance CLOUDY (A) CLEAR   Specific Gravity, Urine 1.023 1.005 - 1.030   pH 6.0 5.0 - 8.0   Glucose, UA NEGATIVE NEGATIVE mg/dL   Hgb urine dipstick NEGATIVE NEGATIVE   Bilirubin Urine NEGATIVE NEGATIVE   Ketones, ur NEGATIVE NEGATIVE mg/dL   Protein, ur NEGATIVE NEGATIVE mg/dL   Urobilinogen, UA 0.2 0.0 - 1.0 mg/dL   Nitrite NEGATIVE NEGATIVE   Leukocytes, UA NEGATIVE NEGATIVE    Comment: MICROSCOPIC NOT DONE ON URINES WITH NEGATIVE  PROTEIN, BLOOD, LEUKOCYTES, NITRITE, OR GLUCOSE <1000 mg/dL.  MRSA culture     Status: None   Collection Time: 11/08/14  9:18 AM  Result Value Ref Range   Specimen Description NOSE    Special Requests NONE    Culture      NO STAPHYLOCOCCUS AUREUS ISOLATED Note: NOMRSA Performed at [facility]    Report Status 11/10/2014 FINAL   APTT     Status: None   Collection Time: 11/08/14  9:20 AM  Result Value Ref Range   aPTT 28 24 - 37 seconds  CBC WITH DIFFERENTIAL     Status: Abnormal   Collection Time: 11/08/14  9:20 AM  Result Value Ref Range   WBC 5.5 4.0 - 10.5 K/uL   RBC 4.92 4.22 - 5.81 MIL/uL   Hemoglobin 14.8 13.0 - 17.0 g/dL   HCT 44.0 39.0 - 52.0 %   MCV 89.4 78.0 - 100.0 fL   MCH 30.1 26.0 - 34.0 pg   MCHC 33.6 30.0 - 36.0 g/dL   RDW 12.8 11.5 - 15.5 %   Platelets 202 150 - 400 K/uL   Neutrophils Relative % 51 43 - 77 %   Neutro Abs 2.8 1.7 - 7.7 K/uL   Lymphocytes Relative 31 12 - 46 %   Lymphs Abs 1.7 0.7 - 4.0 K/uL   Monocytes Relative 9 3 - 12 %   Monocytes Absolute 0.5 0.1 - 1.0 K/uL   Eosinophils Relative 8 (H) 0 - 5 %   Eosinophils Absolute 0.5 0.0 - 0.7 K/uL   Basophils Relative 1 0 - 1 %   Basophils Absolute 0.1 0.0 - 0.1 K/uL  Comprehensive metabolic panel     Status: Abnormal   Collection Time: 11/08/14  9:20 AM  Result Value Ref Range   Sodium 141 137 - 147 mEq/L    Potassium 4.9 3.7 - 5.3 mEq/L   Chloride 101 96 - 112 mEq/L   CO2 29 19 - 32 mEq/L   Glucose, Bld 93 70 - 99 mg/dL   BUN 14 6 - 23 mg/dL   Creatinine, Ser 1.02 0.50 - 1.35 mg/dL   Calcium 9.7 8.4 - 10.5 mg/dL   Total Protein 7.4 6.0 - 8.3 g/dL   Albumin 3.9 3.5 - 5.2 g/dL   AST 37 0 - 37 U/L   ALT 64 (H) 0 - 53 U/L   Alkaline Phosphatase 91 39 - 117 U/L   Total Bilirubin 0.7 0.3 - 1.2 mg/dL   GFR calc non Af Amer 75 (L) >90 mL/min   GFR calc Af Amer 87 (L) >90 mL/min    Comment: (NOTE) The eGFR has been calculated using the CKD EPI equation. This calculation has not been validated in all clinical situations. eGFR's persistently <90 mL/min signify possible Chronic Kidney Disease.    Anion gap 11 5 - 15  Protime-INR     Status: None   Collection Time: 11/08/14  9:20 AM  Result Value Ref Range   Prothrombin Time 13.1 11.6 - 15.2 seconds   INR 0.98 0.00 - 1.49  Type and screen     Status: None   Collection Time: 11/08/14  9:20 AM  Result Value Ref Range   ABO/RH(D) A NEG    Antibody Screen NEG    Sample Expiration 11/19/2014   ABO/Rh     Status: None   Collection Time: 11/08/14  9:30 AM  Result Value Ref Range   ABO/RH(D) A NEG   CBC  Status: Abnormal   Collection Time: 11/17/14  5:11 AM  Result Value Ref Range   WBC 9.7 4.0 - 10.5 K/uL   RBC 3.62 (L) 4.22 - 5.81 MIL/uL   Hemoglobin 11.0 (L) 13.0 - 17.0 g/dL   HCT 32.1 (L) 39.0 - 52.0 %   MCV 88.7 78.0 - 100.0 fL   MCH 30.4 26.0 - 34.0 pg   MCHC 34.3 30.0 - 36.0 g/dL   RDW 12.9 11.5 - 15.5 %   Platelets 163 150 - 400 K/uL  Basic metabolic panel     Status: Abnormal   Collection Time: 11/17/14  5:11 AM  Result Value Ref Range   Sodium 136 (L) 137 - 147 mEq/L   Potassium 3.8 3.7 - 5.3 mEq/L   Chloride 98 96 - 112 mEq/L   CO2 27 19 - 32 mEq/L   Glucose, Bld 137 (H) 70 - 99 mg/dL   BUN 11 6 - 23 mg/dL   Creatinine, Ser 1.11 0.50 - 1.35 mg/dL   Calcium 8.5 8.4 - 10.5 mg/dL   GFR calc non Af Amer 68 (L) >90 mL/min    GFR calc Af Amer 79 (L) >90 mL/min    Comment: (NOTE) The eGFR has been calculated using the CKD EPI equation. This calculation has not been validated in all clinical situations. eGFR's persistently <90 mL/min signify possible Chronic Kidney Disease.    Anion gap 11 5 - 15  CBC     Status: Abnormal   Collection Time: 11/18/14  4:34 AM  Result Value Ref Range   WBC 9.5 4.0 - 10.5 K/uL   RBC 3.16 (L) 4.22 - 5.81 MIL/uL   Hemoglobin 9.7 (L) 13.0 - 17.0 g/dL   HCT 28.3 (L) 39.0 - 52.0 %   MCV 89.6 78.0 - 100.0 fL   MCH 30.7 26.0 - 34.0 pg   MCHC 34.3 30.0 - 36.0 g/dL   RDW 13.1 11.5 - 15.5 %   Platelets 173 150 - 400 K/uL  Comprehensive metabolic panel     Status: Abnormal   Collection Time: 12/23/14 12:35 AM  Result Value Ref Range   Sodium 138 135 - 145 mmol/L    Comment: Please note change in reference range.   Potassium 3.7 3.5 - 5.1 mmol/L    Comment: Please note change in reference range.   Chloride 108 96 - 112 mEq/L   CO2 25 19 - 32 mmol/L   Glucose, Bld 126 (H) 70 - 99 mg/dL   BUN 9 6 - 23 mg/dL   Creatinine, Ser 0.97 0.50 - 1.35 mg/dL   Calcium 8.9 8.4 - 10.5 mg/dL   Total Protein 5.8 (L) 6.0 - 8.3 g/dL   Albumin 3.5 3.5 - 5.2 g/dL   AST 21 0 - 37 U/L   ALT 21 0 - 53 U/L   Alkaline Phosphatase 71 39 - 117 U/L   Total Bilirubin 0.4 0.3 - 1.2 mg/dL   GFR calc non Af Amer 85 (L) >90 mL/min   GFR calc Af Amer >90 >90 mL/min    Comment: (NOTE) The eGFR has been calculated using the CKD EPI equation. This calculation has not been validated in all clinical situations. eGFR's persistently <90 mL/min signify possible Chronic Kidney Disease.    Anion gap 5 5 - 15  Surgical pcr screen     Status: None   Collection Time: 01/13/15  8:42 AM  Result Value Ref Range   MRSA, PCR NEGATIVE NEGATIVE   Staphylococcus aureus NEGATIVE  NEGATIVE    Comment:        The Xpert SA Assay (FDA approved for NASAL specimens in patients over 53 years of age), is one component of a  comprehensive surveillance program.  Test performance has been validated by [facility] for patients greater than or equal to 69 year old. It is not intended to diagnose infection nor to guide or monitor treatment.   Type and screen     Status: None   Collection Time: 01/13/15  8:52 AM  Result Value Ref Range   ABO/RH(D) A NEG    Antibody Screen NEG    Sample Expiration 01/27/2015    Weak D POS   ABO/Rh     Status: None   Collection Time: 01/13/15  8:52 AM  Result Value Ref Range   ABO/RH(D) A NEG   Urinalysis, Routine w reflex microscopic     Status: None   Collection Time: 01/13/15  8:54 AM  Result Value Ref Range   Color, Urine YELLOW YELLOW   APPearance CLEAR CLEAR   Specific Gravity, Urine 1.022 1.005 - 1.030   pH 6.0 5.0 - 8.0   Glucose, UA NEGATIVE NEGATIVE mg/dL   Hgb urine dipstick NEGATIVE NEGATIVE   Bilirubin Urine NEGATIVE NEGATIVE   Ketones, ur NEGATIVE NEGATIVE mg/dL   Protein, ur NEGATIVE NEGATIVE mg/dL   Urobilinogen, UA 0.2 0.0 - 1.0 mg/dL   Nitrite NEGATIVE NEGATIVE   Leukocytes, UA NEGATIVE NEGATIVE    Comment: MICROSCOPIC NOT DONE ON URINES WITH NEGATIVE PROTEIN, BLOOD, LEUKOCYTES, NITRITE, OR GLUCOSE <1000 mg/dL.  APTT     Status: None   Collection Time: 01/13/15  8:57 AM  Result Value Ref Range   aPTT 30 24 - 37 seconds  CBC WITH DIFFERENTIAL     Status: Abnormal   Collection Time: 01/13/15  8:57 AM  Result Value Ref Range   WBC 5.7 4.0 - 10.5 K/uL   RBC 5.16 4.22 - 5.81 MIL/uL   Hemoglobin 14.9 13.0 - 17.0 g/dL   HCT 44.4 39.0 - 52.0 %   MCV 86.0 78.0 - 100.0 fL   MCH 28.9 26.0 - 34.0 pg   MCHC 33.6 30.0 - 36.0 g/dL   RDW 12.7 11.5 - 15.5 %   Platelets 309 150 - 400 K/uL   Neutrophils Relative % 59 43 - 77 %   Neutro Abs 3.5 1.7 - 7.7 K/uL   Lymphocytes Relative 21 12 - 46 %   Lymphs Abs 1.2 0.7 - 4.0 K/uL   Monocytes Relative 8 3 - 12 %   Monocytes Absolute 0.4 0.1 - 1.0 K/uL   Eosinophils Relative 11 (H) 0 - 5 %   Eosinophils Absolute  0.6 0.0 - 0.7 K/uL   Basophils Relative 1 0 - 1 %   Basophils Absolute 0.1 0.0 - 0.1 K/uL  Comprehensive metabolic panel     Status: Abnormal   Collection Time: 01/13/15  8:57 AM  Result Value Ref Range   Sodium 139 135 - 145 mmol/L   Potassium 4.3 3.5 - 5.1 mmol/L   Chloride 101 96 - 112 mEq/L   CO2 30 19 - 32 mmol/L   Glucose, Bld 86 70 - 99 mg/dL   BUN 14 6 - 23 mg/dL   Creatinine, Ser 1.02 0.50 - 1.35 mg/dL   Calcium 10.4 8.4 - 10.5 mg/dL   Total Protein 7.9 6.0 - 8.3 g/dL   Albumin 4.5 3.5 - 5.2 g/dL   AST 27 0 - 37 U/L  ALT 26 0 - 53 U/L   Alkaline Phosphatase 91 39 - 117 U/L   Total Bilirubin 0.7 0.3 - 1.2 mg/dL   GFR calc non Af Amer 75 (L) >90 mL/min   GFR calc Af Amer 87 (L) >90 mL/min    Comment: (NOTE) The eGFR has been calculated using the CKD EPI equation. This calculation has not been validated in all clinical situations. eGFR's persistently <90 mL/min signify possible Chronic Kidney Disease.    Anion gap 8 5 - 15  Protime-INR     Status: None   Collection Time: 01/13/15  8:57 AM  Result Value Ref Range   Prothrombin Time 12.8 11.6 - 15.2 seconds   INR 0.95 0.00 - 1.49    Estimated body mass index is 24.36 kg/(m^2) as calculated from the following:   Height as of this encounter: _0  (1.905 m).   Weight as of this encounter: 194 lb 14 oz (88.395 kg).   Imaging Review Plain radiographs demonstrate severe degenerative joint disease of the right knee(s). The overall alignment ismild varus. The bone quality appears to be good for age and reported activity level.  Assessment/Plan:  End stage arthritis, right knee   The patient history, physical examination, clinical judgment of the provider and imaging studies are consistent with end stage degenerative joint disease of the right knee(s) and total knee arthroplasty is deemed medically necessary. The treatment options including medical management, injection therapy arthroscopy and arthroplasty were discussed  at length. The risks and benefits of total knee arthroplasty were presented and reviewed. The risks due to aseptic loosening, infection, stiffness, patella tracking problems, thromboembolic complications and other imponderables were discussed. The patient acknowledged the explanation, agreed to proceed with the plan and consent was signed. Patient is being admitted for inpatient treatment for surgery, pain control, PT, OT, prophylactic antibiotics, VTE prophylaxis, progressive ambulation and ADL's and discharge planning. The patient is planning to be discharged home with home health services

## 2015-01-23 NOTE — Anesthesia Postprocedure Evaluation (Signed)
  Anesthesia Post-op Note  Patient: Jacob Parker  Procedure(s) Performed: Procedure(s): RIGHT TOTAL KNEE ARTHROPLASTY (Right)  Patient Location: PACU  Anesthesia Type:MAC and Spinal  Level of Consciousness: awake, alert , oriented and patient cooperative  Airway and Oxygen Therapy: Patient Spontanous Breathing  Post-op Pain: mild  Post-op Assessment: Post-op Vital signs reviewed, Patient's Cardiovascular Status Stable, Respiratory Function Stable, Patent Airway, No signs of Nausea or vomiting, Pain level controlled and No headache  Post-op Vital Signs: Reviewed and stable  Last Vitals:  Filed Vitals:   01/23/15 1552  BP: 146/88  Pulse: 67  Temp: 36.6 C  Resp: 14    Complications: No apparent anesthesia complications

## 2015-01-23 NOTE — Plan of Care (Signed)
Problem: Consults Goal: Diagnosis- Total Joint Replacement Primary Total Knee Right     

## 2015-01-23 NOTE — Brief Op Note (Signed)
01/23/2015  2:14 PM  PATIENT:  Jacob Parker  64 y.o. male  PRE-OPERATIVE DIAGNOSIS:  degenerative joint disease right knee  POST-OPERATIVE DIAGNOSIS:  degenerative joint disease right knee  PROCEDURE:  Procedure(s): RIGHT TOTAL KNEE ARTHROPLASTY (Right)  SURGEON:  Surgeon(s) and Role:    * Alta Corning, MD - Primary  PHYSICIAN ASSISTANT:   ASSISTANTS: bethune   ANESTHESIA:   general  EBL:  Total I/O In: 1000 [I.V.:1000] Out: 50 [Blood:50]  BLOOD ADMINISTERED:none  DRAINS: (1) Hemovact drain(s) in the r knee with  Suction Open   LOCAL MEDICATIONS USED:  OTHER experel  SPECIMEN:  No Specimen  DISPOSITION OF SPECIMEN:  N/A  COUNTS:  YES  TOURNIQUET:   Total Tourniquet Time Documented: Thigh (Right) - 57 minutes Total: Thigh (Right) - 57 minutes   DICTATION: .Other Dictation: Dictation Number [id number]  PLAN OF CARE: Admit to inpatient   PATIENT DISPOSITION:  PACU - hemodynamically stable.   Delay start of Pharmacological VTE agent (>24hrs) due to surgical blood loss or risk of bleeding: no

## 2015-01-23 NOTE — Progress Notes (Signed)
Orthopedic Tech Progress Note Patient Details:  Jacob Parker 04/29/1951 [id number] Start time 58 CPM Right Knee CPM Right Knee: On Right Knee Flexion (Degrees): 70 Right Knee Extension (Degrees): 0 Additional Comments: no overhead frame at this time   Braulio Bosch 01/23/2015, 3:43 PM

## 2015-01-23 NOTE — Discharge Instructions (Signed)
Total Knee Replacement, Care After °Refer to this sheet in the next few weeks. These instructions provide you with information on caring for yourself after your procedure. Your health care provider also may give you specific instructions. Your treatment has been planned according to the most current medical practices, but problems sometimes occur. Call your health care provider if you have any problems or questions after your procedure. °HOME CARE INSTRUCTIONS  °· See a physical therapist as directed by your health care provider. °· Take medicines only as directed by your health care provider. °· Avoid lifting or driving until you are instructed otherwise. °· If you have been sent home with a continuous passive motion machine, use it as directed by your health care provider. °SEEK MEDICAL CARE IF: °· You have difficulty breathing. °· You have drainage, redness, swelling, or pain at your incision site. °· You have a bad smell coming from your incision site. °· You have persistent bleeding from your incision site. °· Your incision breaks open after sutures (stitches) or staples have been removed. °· You have a fever. °SEEK IMMEDIATE MEDICAL CARE IF:  °· You have a rash. °· You have pain or swelling in your calf or thigh. °· You have shortness of breath or chest pain. °· Your range of motion in your knee is decreasing rather than increasing. °MAKE SURE YOU:  °· Understand these instructions. °· Will watch your condition. °· Will get help right away if you are not doing well or get worse. °Document Released: 06/28/2005 Document Revised: 04/25/2014 Document Reviewed: 01/28/2012 °[employer]® Patient Information ©2015 [employer]. This information is not intended to replace advice given to you by your health care provider. Make sure you discuss any questions you have with your health care provider. ° °

## 2015-01-24 ENCOUNTER — Encounter (HOSPITAL_COMMUNITY): Payer: Self-pay | Admitting: Orthopedic Surgery

## 2015-01-24 LAB — BASIC METABOLIC PANEL
ANION GAP: 7 (ref 5–15)
BUN: 8 mg/dL (ref 6–23)
CHLORIDE: 106 mmol/L (ref 96–112)
CO2: 25 mmol/L (ref 19–32)
Calcium: 8.6 mg/dL (ref 8.4–10.5)
Creatinine, Ser: 0.89 mg/dL (ref 0.50–1.35)
GFR calc Af Amer: >90 mL/min (ref >90)
GFR calc non Af Amer: 88 mL/min — ABNORMAL LOW (ref >90)
GLUCOSE: 126 mg/dL — AB (ref 70–99)
Potassium: 4.1 mmol/L (ref 3.5–5.1)
SODIUM: 138 mmol/L (ref 135–145)

## 2015-01-24 LAB — CBC
HCT: 31.1 % — ABNORMAL LOW (ref 39.0–52.0)
HEMOGLOBIN: 10.7 g/dL — AB (ref 13.0–17.0)
MCH: 29 pg (ref 26.0–34.0)
MCHC: 34.4 g/dL (ref 30.0–36.0)
MCV: 84.3 fL (ref 78.0–100.0)
PLATELETS: 234 10*3/uL (ref 150–400)
RBC: 3.69 MIL/uL — AB (ref 4.22–5.81)
RDW: 13 % (ref 11.5–15.5)
WBC: 9.6 10*3/uL (ref 4.0–10.5)

## 2015-01-24 NOTE — Progress Notes (Signed)
Physical Therapy Treatment Note  Clinical Impression: Pt progressing towards all goals however in 9/10 pain. Pt demo's excellent active knee ROM as well. Acute PT to con't to progress mobility as able, suspect pt to be ready for planned d/c tomorrow AM once medically cleared.   01/24/15 1447  PT Visit Information  Last PT Received On 01/24/15  Assistance Needed +1  History of Present Illness Pt admitted 2/1 for elective R TKA. Pt had L TKA 11/2014.  PT Time Calculation  PT Start Time (ACUTE ONLY) 1447  PT Stop Time (ACUTE ONLY) 1507  PT Time Calculation (min) (ACUTE ONLY) 20 min  Subjective Data  Subjective "I'm ready to go"  Patient Stated Goal home  Precautions  Precautions Knee  Restrictions  Weight Bearing Restrictions Yes  RLE Weight Bearing WBAT  Pain Assessment  Pain Assessment 0-10  Pain Score 9  Pain Location Rt knee  Pain Descriptors / Indicators Sore  Pain Intervention(s) Monitored during session  Cognition  Arousal/Alertness Awake/alert  Behavior During Therapy WFL for tasks assessed/performed  Overall Cognitive Status Within Functional Limits for tasks assessed  Bed Mobility  Overal bed mobility Needs Assistance  Bed Mobility Sit to Supine  Sit to supine Min assist (for R LE)  Transfers  Overall transfer level Needs assistance  Equipment used Rolling walker (2 wheeled)  Transfers Sit to/from Stand  Sit to Stand Min guard  General transfer comment pt demo'd great technique  Ambulation/Gait  Ambulation/Gait assistance Min guard  Ambulation Distance (Feet) 230 Feet  Assistive device Rolling walker (2 wheeled)  Gait Pattern/deviations Step-through pattern  Gait velocity interpretation Below normal speed for age/gender  General Gait Details pt with increased R knee pain. Educated on increasing R LE WBing and decreasing bilat UE WBing. Pt achieving good R knee extension however painful.  Exercises  Exercises Total Joint  Total Joint Exercises  Heel Slides  AROM;Right;10 reps;Supine  Goniometric ROM 2-80 Active R knee flex  PT - End of Session  Equipment Utilized During Treatment Gait belt  Activity Tolerance Patient tolerated treatment well  Patient left in bed;in CPM;with call bell/phone within reach  Nurse Communication Mobility status  PT - Assessment/Plan  PT Plan Current plan remains appropriate  PT Frequency (ACUTE ONLY) 7X/week  Follow Up Recommendations Home health PT;Supervision/Assistance - 24 hour  PT equipment None recommended by PT  PT Goal Progression  Progress towards PT goals Progressing toward goals  PT General Charges  $$ ACUTE PT VISIT 1 Procedure  PT Treatments  $Gait Training 8-22 mins   Kittie Plater, PT, DPT Pager #: [phone] Office #: [phone]

## 2015-01-24 NOTE — Progress Notes (Signed)
Subjective: 1 Day Post-Op Procedure(s) (LRB): RIGHT TOTAL KNEE ARTHROPLASTY (Right) Patient reports pain as 4 on 0-10 scale. Taking by mouth and voiding okay. Up with physical therapy. No nausea and vomiting.  Objective: Vital signs in last 24 hours: Temp:  [96.8 F (36 C)-98.5 F (36.9 C)] 97.4 F (36.3 C) (02/02 0548) Pulse Rate:  [63-91] 81 (02/02 0548) Resp:  [9-27] 14 (02/02 0548) BP: (113-150)/(50-88) 113/57 mmHg (02/02 0548) SpO2:  [96 %-100 %] 96 % (02/02 0548) Weight:  [88.395 kg (194 lb 14 oz)] 88.395 kg (194 lb 14 oz) (02/01 1022)  Intake/Output from previous day: 02/01 0701 - 02/02 0700 In: 2020 [P.O.:1020; I.V.:1000] Out: 2265 [Urine:1400; Drains:815; Blood:50] Intake/Output this shift: Total I/O In: 1485.7 [P.O.:240; I.V.:1145.7; IV Piggyback:100] Out: 300 [Urine:300]   Recent Labs  01/24/15 0542  HGB 10.7*    Recent Labs  01/24/15 0542  WBC 9.6  RBC 3.69*  HCT 31.1*  PLT 234    Recent Labs  01/24/15 0542  NA 138  K 4.1  CL 106  CO2 25  BUN 8  CREATININE 0.89  GLUCOSE 126*  CALCIUM 8.6   No results for input(s): LABPT, INR in the last 72 hours. Right knee exam: Hemovac drain intact. He has had a total of 815 mL Hemovac drain output. It is currently slowing down. Neurovascular intact Sensation intact distally Intact pulses distally Dorsiflexion/Plantar flexion intact Incision: dressing C/D/I Compartment soft  Assessment/Plan: 1 Day Post-Op Procedure(s) (LRB): RIGHT TOTAL KNEE ARTHROPLASTY (Right) Plan: Will leave Hemovac drain in until mid afternoon then will pull it. Continue oral aspirin 325 mg twice daily with SCDs. Up with therapy Plan for discharge tomorrow  Erlene Senters 01/24/2015, 9:38 AM

## 2015-01-24 NOTE — Evaluation (Signed)
Physical Therapy Evaluation Patient Details Name: Jacob Parker MRN: [id number] DOB: 1951/12/15 Today's Date: 01/24/2015   History of Present Illness  Pt admitted 2/1 for elective R TKA. Pt had L TKA 11/2014.  Clinical Impression  Pt is s/p TKA resulting in the deficits listed below (see PT Problem List). Pt able to amb 150 feet this am and demos excellent R Knee ROM for POD #1.  Pt will benefit from skilled PT to increase their independence and safety with mobility to allow discharge to the venue listed below.      Follow Up Recommendations Home health PT;Supervision/Assistance - 24 hour    Equipment Recommendations   (may be interested in 3n1)    Recommendations for Other Services       Precautions / Restrictions Precautions Precautions: Knee Restrictions Weight Bearing Restrictions: Yes RLE Weight Bearing: Weight bearing as tolerated      Mobility  Bed Mobility Overal bed mobility: Needs Assistance Bed Mobility: Supine to Sit     Supine to sit: Min guard     General bed mobility comments: pt able to manage R LE  Transfers Overall transfer level: Needs assistance Equipment used: Rolling walker (2 wheeled) Transfers: Sit to/from Stand Sit to Stand: Min assist         General transfer comment: pt demo'd great technique  Ambulation/Gait Ambulation/Gait assistance: Min guard Ambulation Distance (Feet): 150 Feet Assistive device: Rolling walker (2 wheeled) Gait Pattern/deviations: Step-through pattern   Gait velocity interpretation: Below normal speed for age/gender General Gait Details: pt with good technique and knee extension during R LE stance phase  Stairs            Wheelchair Mobility    Modified Rankin (Stroke Patients Only)       Balance                                             Pertinent Vitals/Pain Pain Assessment: 0-10 Pain Score: 8  Pain Location: Rt knee Pain Descriptors / Indicators: Sore Pain  Intervention(s): Monitored during session    Home Living Family/patient expects to be discharged to:: Private residence Living Arrangements: Spouse/significant other Available Help at Discharge: Family;Available 24 hours/day Type of Home: House Home Access: Stairs to enter Entrance Stairs-Rails: None Entrance Stairs-Number of Steps: 1 Home Layout: Two level Home Equipment: Walker - 2 wheels;Tub bench      Prior Function Level of Independence: Independent         Comments: was just back to amb without AD from recent knee surgery     Hand Dominance   Dominant Hand: Right    Extremity/Trunk Assessment   Upper Extremity Assessment: Overall WFL for tasks assessed           Lower Extremity Assessment: RLE deficits/detail      Cervical / Trunk Assessment: Normal  Communication   Communication: No difficulties  Cognition Arousal/Alertness: Awake/alert Behavior During Therapy: WFL for tasks assessed/performed Overall Cognitive Status: Within Functional Limits for tasks assessed                      General Comments      Exercises Total Joint Exercises Ankle Circles/Pumps: AROM;Both;10 reps Quad Sets: AROM;Both;10 reps Heel Slides: AROM;Right;10 reps;Supine      Assessment/Plan    PT Assessment Patient needs continued PT services  PT Diagnosis Difficulty walking;Acute pain  PT Problem List Decreased strength;Decreased range of motion;Decreased activity tolerance;Decreased balance;Decreased mobility  PT Treatment Interventions Gait training;DME instruction;Stair training;Functional mobility training;Therapeutic activities;Therapeutic exercise   PT Goals (Current goals can be found in the Care Plan section) Acute Rehab PT Goals Patient Stated Goal: home PT Goal Formulation: With patient    Frequency 7X/week   Barriers to discharge        Co-evaluation               End of Session Equipment Utilized During Treatment: Gait belt Activity  Tolerance: Patient tolerated treatment well Patient left: in chair;with call bell/phone within reach (MD in room)           Time: 8832-5498 PT Time Calculation (min) (ACUTE ONLY): 28 min   Charges:   PT Evaluation $Initial PT Evaluation Tier I: 1 Procedure PT Treatments $Gait Training: 8-22 mins   PT G CodesKingsley Callander 01/24/2015, 10:14 AM   Kittie Plater, PT, DPT Pager #: [phone] Office #: [phone]

## 2015-01-24 NOTE — Op Note (Signed)
NAMEBRAXEN, DOBEK NO.:  [id number]  MEDICAL RECORD NO.:  [id number]  LOCATION:  5N32C                        FACILITY:  [facility]  PHYSICIAN:  Alta Corning, M.D.   DATE OF BIRTH:  February 25, 1951  DATE OF PROCEDURE:  01/23/2015 DATE OF DISCHARGE:                              OPERATIVE REPORT   PREOPERATIVE DIAGNOSIS:  End-stage degenerative joint disease, right knee.  POSTOPERATIVE DIAGNOSIS:  End-stage degenerative joint disease, right knee.  PROCEDURE:  Right total knee replacement with a Sigma system, size 5 femur, size 5 tibia, 10 mm bridging bearing, and a 38 mm all polyethylene patella.  SURGEON:  Alta Corning, M.D.  ASSISTANT:  Gary Fleet, P.A.  ANESTHESIA:  General.  BRIEF HISTORY:  Mr. Whitby is a 64 year old male with a long history of severe complaints of bilateral knee pain.  He had end-stage degenerative joint disease of both knees.  Left knee was done and he has done well and after failure of all conservative care, he was taken to the operating room for right total knee replacement.  DESCRIPTION OF PROCEDURE:  The patient was taken to the operating room. After adequate level of anesthesia was obtained with general anesthetic, the patient was placed supine on the operating table.  The right leg was then prepped and draped in usual sterile fashion.  Following this, the leg was exsanguinated.  Blood pressure tourniquet inflated to 350 mmHg. Following this, a midline incision was made in the subcutaneous tissue down the level of the extensor mechanism and a medial parapatellar arthrotomy was undertaken.  Once this was completed, attention was turned to the right knee where a medial and lateral meniscus were removed, retropatellar fat pad, synovium in the anterior aspect of the femur, and medial and lateral meniscus.  Attention was then turned to the femur where an intramedullary pilot hole was drilled and a 5-degree valgus alignment  guide was used and the distal femur was cut perpendicular to the anatomic axis.  The anterior and posterior cuts were made, chamfers and box.  Attention was then turned to the tibia where the proximal tibial cut was made with an extramedullary guide. Size 10 spacer block will be put in place and easy full extension was achieved at this point, and once this was completed, attention was now turned towards the placement of the trial components.  The tibia had been exposed, drilled, and keeled, and a trial 5 was placed.  Trial 5 femur and 10 spacer, easy full extension, range of motion, stability were excellent.  Attention was turned to the patella, cut down to a level of 14 mm and a 38 paddle was chosen and the pilot holes were drilled for the paddle of the patella.  Once this was done, the patella was put in place and the knee put through a range of motion.  Excellent stability and range of motion achieved.  All trial components removed. The knee was copiously and thoroughly lavaged with pulse lavage irrigation and suctioned dry and the final components were cemented into place, size 5 femur, size 5 tibia, 10 mm bridging bearing trial was placed, and a 38 mm all-poly patella was placed.  Once the cement was  completely hardened, all excess bone cement was removed and the tourniquet was let down.  At this point, all bleeders were controlled with electrocautery.  Tranexamic acid 2 g and 50 mL of saline were put into the knee and set for 3 minutes and allowed to set up.  Once this was done, this was aspirated out and 60 mL of Exparel was instilled into the perioperative tissues for postoperative pain control.  At this point, the final poly was placed.  The knee was again irrigated, suctioned dry, and the medial parapatellar arthrotomy was closed with 1 Vicryl running, skin with 0 and 2-0 Vicryl, and 3-0 Monocryl subcuticular.  Benzoin and Steri-Strips were applied.  Sterile compressive dressing  was applied.  The patient was taken to the recovery room and was noted to be in satisfactory condition.  Estimated blood loss for the procedure was minimal.     Alta Corning, M.D.     Corliss Skains  D:  01/23/2015  T:  01/24/2015  Job:  [id number]

## 2015-01-24 NOTE — Progress Notes (Signed)
Utilization review completed.  

## 2015-01-24 NOTE — Progress Notes (Signed)
01/24/15 Spoke with patient about HHC. He thought he was set up with [facility]. Checked with Stanton Kidney at [place], they are not in network with his insurance. Patient selected [facility]. Contacted Miranda and set up HHPT. Spoke with Ruby Cola from [employer], they are providing a CPM and 3N1. Patient states that he has a rolling walker and that his wife will be able to assist after discharge.

## 2015-01-25 DIAGNOSIS — Z96651 Presence of right artificial knee joint: Secondary | ICD-10-CM | POA: Diagnosis not present

## 2015-01-25 LAB — CBC
HEMATOCRIT: 27.8 % — AB (ref 39.0–52.0)
Hemoglobin: 9.5 g/dL — ABNORMAL LOW (ref 13.0–17.0)
MCH: 29.1 pg (ref 26.0–34.0)
MCHC: 34.2 g/dL (ref 30.0–36.0)
MCV: 85.3 fL (ref 78.0–100.0)
PLATELETS: 213 10*3/uL (ref 150–400)
RBC: 3.26 MIL/uL — ABNORMAL LOW (ref 4.22–5.81)
RDW: 13.4 % (ref 11.5–15.5)
WBC: 7.7 10*3/uL (ref 4.0–10.5)

## 2015-01-25 MED ORDER — OXYCODONE HCL ER 20 MG PO T12A: 20.0000 mg | EXTENDED_RELEASE_TABLET | Freq: Two times a day (BID) | ORAL | Status: DC

## 2015-01-25 NOTE — Discharge Summary (Signed)
Patient ID: Jacob Parker MRN: [id number] DOB/AGE: [birth year] 64 y.o.  Admit date: 01/23/2015 Discharge date: 01/25/2015  Admission Diagnoses:  Principal Problem:   Primary osteoarthritis of right knee Active Problems:   Primary osteoarthritis of left knee   Status post total left knee replacement   Discharge Diagnoses:  Same  Past Medical History  Diagnosis Date  . Hypertension   . Hyperlipidemia   . DJD (degenerative joint disease)   . History of skin cancer 1981 / 2015  . Stricture esophagus   . Sleep apnea     uses c pap  . GERD (gastroesophageal reflux disease)   . Coronary artery disease 2011    has 2 stents in [place]; as if 10/2014 followed by Dr. Wynonia Lawman in [place]     Surgeries: Procedure(s): RIGHT TOTAL KNEE ARTHROPLASTY on 01/23/2015   Consultants:    Discharged Condition: Improved  Hospital Course: Paula Zietz is an 64 y.o. male who was admitted 01/23/2015 for operative treatment ofPrimary osteoarthritis of right knee. Patient has severe unremitting pain that affects sleep, daily activities, and work/hobbies. After pre-op clearance the patient was taken to the operating room on 01/23/2015 and underwent  Procedure(s): RIGHT TOTAL KNEE ARTHROPLASTY.    Patient was given perioperative antibiotics: Anti-infectives    Start     Dose/Rate Route Frequency Ordered Stop   01/23/15 1800  ceFAZolin (ANCEF) IVPB 2 g/50 mL premix     2 g100 mL/hr over 30 Minutes Intravenous Every 6 hours 01/23/15 1456 01/24/15 0139   01/23/15 0600  ceFAZolin (ANCEF) IVPB 2 g/50 mL premix     2 g100 mL/hr over 30 Minutes Intravenous On call to O.R. 01/22/15 1340 01/23/15 1250       Patient was given sequential compression devices, early ambulation, and chemoprophylaxis to prevent DVT.  Patient benefited maximally from hospital stay and there were no complications.    Recent vital signs: Patient Vitals for the past 24 hrs:  BP Temp Pulse Resp SpO2  01/25/15 0616 120/65 mmHg 97.4 F (36.3  C) 72 16 96 %  01/24/15 2117 (!) 106/51 mmHg 97.8 F (36.6 C) 75 16 96 %  01/24/15 1300 (!) 91/48 mmHg 98.1 F (36.7 C) 75 16 98 %     Recent laboratory studies:  Recent Labs  01/24/15 0542 01/25/15 0525  WBC 9.6 7.7  HGB 10.7* 9.5*  HCT 31.1* 27.8*  PLT 234 213  NA 138  --   K 4.1  --   CL 106  --   CO2 25  --   BUN 8  --   CREATININE 0.89  --   GLUCOSE 126*  --   CALCIUM 8.6  --      Discharge Medications:     Medication List    STOP taking these medications        fentaNYL 25 MCG/HR patch  Commonly known as:  DURAGESIC     HYDROcodone-acetaminophen 10-325 MG per tablet  Commonly known as:  NORCO     traMADol 50 MG tablet  Commonly known as:  ULTRAM      TAKE these medications        acetaminophen 500 MG tablet  Commonly known as:  TYLENOL  Take 1,000 mg by mouth every 6 (six) hours as needed (pain).     aspirin EC 325 MG tablet  Take 1 tablet (325 mg total) by mouth 2 (two) times daily after a meal. Take x 1 month post op to decrease risk of blood clots.  esomeprazole 20 MG capsule  Commonly known as:  NEXIUM  Take 20 mg by mouth daily before breakfast.     Fish Oil 1000 MG Caps  Take 1,000 mg by mouth daily.     hyoscyamine 0.375 MG 12 hr tablet  Commonly known as:  LEVBID  TAKE 1 TABLET BY MOUTH TWICE DAILY( EVERY TWELVE HOURS) AS NEEDED     lisinopril 20 MG tablet  Commonly known as:  PRINIVIL,ZESTRIL  Take 20 mg by mouth at bedtime.     multivitamin with minerals Tabs tablet  Take 1 tablet by mouth daily.     nebivolol 5 MG tablet  Commonly known as:  BYSTOLIC  Take 5 mg by mouth daily.     OxyCODONE 20 mg T12a 12 hr tablet  Commonly known as:  OXYCONTIN  Take 1 tablet (20 mg total) by mouth every 12 (twelve) hours.     oxyCODONE-acetaminophen 5-325 MG per tablet  Commonly known as:  PERCOCET/ROXICET  Take 1-2 tablets by mouth every 4 (four) hours as needed for severe pain (pain).     rosuvastatin 40 MG tablet  Commonly  known as:  CRESTOR  Take 40 mg by mouth at bedtime.     tiZANidine 2 MG tablet  Commonly known as:  ZANAFLEX  Take 1 tablet (2 mg total) by mouth every 6 (six) hours as needed for muscle spasms.        Diagnostic Studies: No results found.  Disposition: 01-Home or Self Care      Discharge Instructions    CPM    Complete by:  As directed   Continuous passive motion machine (CPM):      Use the CPM from 0 to 60 for 8 hours per day.      You may increase by 5-10 per day.  You may break it up into 2 or 3 sessions per day.      Use CPM for 1-2 weeks or until you are told to stop.     Call MD / Call 911    Complete by:  As directed   If you experience chest pain or shortness of breath, CALL 911 and be transported to the hospital emergency room.  If you develope a fever above 101 F, pus (white drainage) or increased drainage or redness at the wound, or calf pain, call your surgeon's office.     Constipation Prevention    Complete by:  As directed   Drink plenty of fluids.  Prune juice may be helpful.  You may use a stool softener, such as Colace (over the counter) 100 mg twice a day.  Use MiraLax (over the counter) for constipation as needed.     Diet general    Complete by:  As directed      Do not put a pillow under the knee. Place it under the heel.    Complete by:  As directed      Increase activity slowly as tolerated    Complete by:  As directed      Weight bearing as tolerated    Complete by:  As directed   Extremity:  Lower     Weight bearing as tolerated    Complete by:  As directed   Laterality:  right  Extremity:  Lower           Follow-up Information    Follow up with GRAVES,JOHN L, MD. Schedule an appointment as soon as possible for a visit in 2 weeks.  Specialty:  Orthopedic Surgery   Contact information:   [place] [address] [phone]       Follow up with [facility].   Why:  They will contact you to schedule  home therapy visits.   Contact information:   [address] [place] [address] [phone]        Signed: Erlene Senters 01/25/2015, 8:31 AM

## 2015-01-25 NOTE — Progress Notes (Signed)
Physical Therapy Treatment Patient Details Name: Argil Mahl MRN: [id number] DOB: Jul 02, 1951 Today's Date: 01/25/2015    History of Present Illness Pt admitted 2/1 for elective R TKA. Pt had L TKA 11/2014.    PT Comments    Pt progressing well towards all goals. Pt con't to have increased R knee pain with WBIng limiting ambulation tolerance however demo's great step-through technique. Pt completed stair negotiation and is safe to d/c home with family once medically cleared.   Follow Up Recommendations  Home health PT;Supervision/Assistance - 24 hour     Equipment Recommendations  None recommended by PT    Recommendations for Other Services       Precautions / Restrictions Precautions Precautions: Knee Restrictions Weight Bearing Restrictions: Yes RLE Weight Bearing: Weight bearing as tolerated    Mobility  Bed Mobility Overal bed mobility: Modified Independent Bed Mobility: Supine to Sit     Supine to sit: Modified independent (Device/Increase time)     General bed mobility comments: pt with great technique  Transfers Overall transfer level: Needs assistance Equipment used: Rolling walker (2 wheeled) Transfers: Sit to/from Stand Sit to Stand: Supervision         General transfer comment: pt demo'd great technique  Ambulation/Gait Ambulation/Gait assistance: Supervision Ambulation Distance (Feet): 230 Feet Assistive device: Rolling walker (2 wheeled) Gait Pattern/deviations: Step-through pattern   Gait velocity interpretation: Below normal speed for age/gender General Gait Details: pt con't to have limited R LE WBing due to pain however demo's great step through gait pattern despite increased pain   Stairs Stairs: Yes Stairs assistance: Min guard Stair Management: One rail Right;Sideways Number of Stairs: 14 General stair comments: pt demo'd good understanding of technique  Wheelchair Mobility    Modified Rankin (Stroke Patients Only)       Balance                                    Cognition Arousal/Alertness: Awake/alert Behavior During Therapy: WFL for tasks assessed/performed Overall Cognitive Status: Within Functional Limits for tasks assessed                      Exercises Total Joint Exercises Quad Sets: AROM;Both;10 reps Heel Slides: AROM;Right;10 reps;Supine Goniometric ROM: 5-82    General Comments        Pertinent Vitals/Pain Pain Assessment: 0-10 Pain Score: 8  (6 prior to PT) Pain Location: Rt knee Pain Descriptors / Indicators: Sore Pain Intervention(s): Monitored during session    Home Living                      Prior Function            PT Goals (current goals can now be found in the care plan section) Progress towards PT goals: Progressing toward goals    Frequency  7X/week    PT Plan Current plan remains appropriate    Co-evaluation             End of Session Equipment Utilized During Treatment: Gait belt Activity Tolerance: Patient tolerated treatment well Patient left: in chair;with call bell/phone within reach;with family/visitor present     Time: 7846-9629 PT Time Calculation (min) (ACUTE ONLY): 32 min  Charges:  $Gait Training: 23-37 mins                    G Codes:  Kingsley Callander 01/25/2015, 10:19 AM   Kittie Plater, PT, DPT Pager #: [phone] Office #: [phone]

## 2015-01-25 NOTE — Progress Notes (Signed)
Subjective: 2 Days Post-Op Procedure(s) (LRB): RIGHT TOTAL KNEE ARTHROPLASTY (Right) Patient reports pain as moderate.  Taking by mouth and voiding okay. Progressing with PT.  Objective: Vital signs in last 24 hours: Temp:  [97.4 F (36.3 C)-98.1 F (36.7 C)] 97.4 F (36.3 C) (02/03 0616) Pulse Rate:  [72-75] 72 (02/03 0616) Resp:  [16] 16 (02/03 0616) BP: (91-120)/(48-65) 120/65 mmHg (02/03 0616) SpO2:  [96 %-98 %] 96 % (02/03 0616)  Intake/Output from previous day: 02/02 0701 - 02/03 0700 In: 2276.7 [P.O.:960; I.V.:1216.7; IV Piggyback:100] Out: 2275 [Urine:2125; Drains:150] Intake/Output this shift: Total I/O In: 240 [P.O.:240] Out: 300 [Urine:300]   Recent Labs  01/24/15 0542 01/25/15 0525  HGB 10.7* 9.5*    Recent Labs  01/24/15 0542 01/25/15 0525  WBC 9.6 7.7  RBC 3.69* 3.26*  HCT 31.1* 27.8*  PLT 234 213    Recent Labs  01/24/15 0542  NA 138  K 4.1  CL 106  CO2 25  BUN 8  CREATININE 0.89  GLUCOSE 126*  CALCIUM 8.6   No results for input(s): LABPT, INR in the last 72 hours. Right knee exam: Neurovascular intact Sensation intact distally Intact pulses distally Dorsiflexion/Plantar flexion intact Incision: dressing C/D/I Compartment soft  Assessment/Plan: 2 Days Post-Op Procedure(s) (LRB): RIGHT TOTAL KNEE ARTHROPLASTY (Right) Plan: Dressing changed by nursing staff. Discharge home with home health Follow-up with Dr. Berenice Primas in 2 weeks. Jacob Parker 01/25/2015, 8:25 AM

## 2015-01-26 DIAGNOSIS — Z96651 Presence of right artificial knee joint: Secondary | ICD-10-CM | POA: Diagnosis not present

## 2015-01-26 DIAGNOSIS — I10 Essential (primary) hypertension: Secondary | ICD-10-CM | POA: Diagnosis not present

## 2015-01-26 DIAGNOSIS — E785 Hyperlipidemia, unspecified: Secondary | ICD-10-CM | POA: Diagnosis not present

## 2015-01-26 DIAGNOSIS — G4733 Obstructive sleep apnea (adult) (pediatric): Secondary | ICD-10-CM | POA: Diagnosis not present

## 2015-01-26 DIAGNOSIS — Z471 Aftercare following joint replacement surgery: Secondary | ICD-10-CM | POA: Diagnosis not present

## 2015-01-26 DIAGNOSIS — Z96652 Presence of left artificial knee joint: Secondary | ICD-10-CM | POA: Diagnosis not present

## 2015-01-27 DIAGNOSIS — Z471 Aftercare following joint replacement surgery: Secondary | ICD-10-CM | POA: Diagnosis not present

## 2015-01-27 DIAGNOSIS — Z96652 Presence of left artificial knee joint: Secondary | ICD-10-CM | POA: Diagnosis not present

## 2015-01-27 DIAGNOSIS — I10 Essential (primary) hypertension: Secondary | ICD-10-CM | POA: Diagnosis not present

## 2015-01-27 DIAGNOSIS — G4733 Obstructive sleep apnea (adult) (pediatric): Secondary | ICD-10-CM | POA: Diagnosis not present

## 2015-01-27 DIAGNOSIS — E785 Hyperlipidemia, unspecified: Secondary | ICD-10-CM | POA: Diagnosis not present

## 2015-01-27 DIAGNOSIS — Z96651 Presence of right artificial knee joint: Secondary | ICD-10-CM | POA: Diagnosis not present

## 2015-01-30 DIAGNOSIS — E785 Hyperlipidemia, unspecified: Secondary | ICD-10-CM | POA: Diagnosis not present

## 2015-01-30 DIAGNOSIS — Z96652 Presence of left artificial knee joint: Secondary | ICD-10-CM | POA: Diagnosis not present

## 2015-01-30 DIAGNOSIS — Z471 Aftercare following joint replacement surgery: Secondary | ICD-10-CM | POA: Diagnosis not present

## 2015-01-30 DIAGNOSIS — I10 Essential (primary) hypertension: Secondary | ICD-10-CM | POA: Diagnosis not present

## 2015-01-30 DIAGNOSIS — Z96651 Presence of right artificial knee joint: Secondary | ICD-10-CM | POA: Diagnosis not present

## 2015-01-30 DIAGNOSIS — G4733 Obstructive sleep apnea (adult) (pediatric): Secondary | ICD-10-CM | POA: Diagnosis not present

## 2015-02-01 DIAGNOSIS — E785 Hyperlipidemia, unspecified: Secondary | ICD-10-CM | POA: Diagnosis not present

## 2015-02-01 DIAGNOSIS — I10 Essential (primary) hypertension: Secondary | ICD-10-CM | POA: Diagnosis not present

## 2015-02-01 DIAGNOSIS — Z96651 Presence of right artificial knee joint: Secondary | ICD-10-CM | POA: Diagnosis not present

## 2015-02-01 DIAGNOSIS — G4733 Obstructive sleep apnea (adult) (pediatric): Secondary | ICD-10-CM | POA: Diagnosis not present

## 2015-02-01 DIAGNOSIS — Z96652 Presence of left artificial knee joint: Secondary | ICD-10-CM | POA: Diagnosis not present

## 2015-02-01 DIAGNOSIS — Z471 Aftercare following joint replacement surgery: Secondary | ICD-10-CM | POA: Diagnosis not present

## 2015-02-03 DIAGNOSIS — I10 Essential (primary) hypertension: Secondary | ICD-10-CM | POA: Diagnosis not present

## 2015-02-03 DIAGNOSIS — Z96651 Presence of right artificial knee joint: Secondary | ICD-10-CM | POA: Diagnosis not present

## 2015-02-03 DIAGNOSIS — Z471 Aftercare following joint replacement surgery: Secondary | ICD-10-CM | POA: Diagnosis not present

## 2015-02-03 DIAGNOSIS — G4733 Obstructive sleep apnea (adult) (pediatric): Secondary | ICD-10-CM | POA: Diagnosis not present

## 2015-02-03 DIAGNOSIS — E785 Hyperlipidemia, unspecified: Secondary | ICD-10-CM | POA: Diagnosis not present

## 2015-02-03 DIAGNOSIS — Z96652 Presence of left artificial knee joint: Secondary | ICD-10-CM | POA: Diagnosis not present

## 2015-02-07 DIAGNOSIS — Z9889 Other specified postprocedural states: Secondary | ICD-10-CM | POA: Diagnosis not present

## 2015-02-08 DIAGNOSIS — E785 Hyperlipidemia, unspecified: Secondary | ICD-10-CM | POA: Diagnosis not present

## 2015-02-08 DIAGNOSIS — Z96652 Presence of left artificial knee joint: Secondary | ICD-10-CM | POA: Diagnosis not present

## 2015-02-08 DIAGNOSIS — I10 Essential (primary) hypertension: Secondary | ICD-10-CM | POA: Diagnosis not present

## 2015-02-08 DIAGNOSIS — Z96651 Presence of right artificial knee joint: Secondary | ICD-10-CM | POA: Diagnosis not present

## 2015-02-08 DIAGNOSIS — G4733 Obstructive sleep apnea (adult) (pediatric): Secondary | ICD-10-CM | POA: Diagnosis not present

## 2015-02-08 DIAGNOSIS — Z471 Aftercare following joint replacement surgery: Secondary | ICD-10-CM | POA: Diagnosis not present

## 2015-02-10 DIAGNOSIS — Z471 Aftercare following joint replacement surgery: Secondary | ICD-10-CM | POA: Diagnosis not present

## 2015-02-10 DIAGNOSIS — E785 Hyperlipidemia, unspecified: Secondary | ICD-10-CM | POA: Diagnosis not present

## 2015-02-10 DIAGNOSIS — Z96652 Presence of left artificial knee joint: Secondary | ICD-10-CM | POA: Diagnosis not present

## 2015-02-10 DIAGNOSIS — Z96651 Presence of right artificial knee joint: Secondary | ICD-10-CM | POA: Diagnosis not present

## 2015-02-10 DIAGNOSIS — I10 Essential (primary) hypertension: Secondary | ICD-10-CM | POA: Diagnosis not present

## 2015-02-10 DIAGNOSIS — G4733 Obstructive sleep apnea (adult) (pediatric): Secondary | ICD-10-CM | POA: Diagnosis not present

## 2015-02-13 DIAGNOSIS — M25661 Stiffness of right knee, not elsewhere classified: Secondary | ICD-10-CM | POA: Diagnosis not present

## 2015-02-13 DIAGNOSIS — Z96651 Presence of right artificial knee joint: Secondary | ICD-10-CM | POA: Diagnosis not present

## 2015-02-13 DIAGNOSIS — Z96652 Presence of left artificial knee joint: Secondary | ICD-10-CM | POA: Diagnosis not present

## 2015-02-13 DIAGNOSIS — M25561 Pain in right knee: Secondary | ICD-10-CM | POA: Diagnosis not present

## 2015-02-14 DIAGNOSIS — M25561 Pain in right knee: Secondary | ICD-10-CM | POA: Diagnosis not present

## 2015-02-14 DIAGNOSIS — Z96652 Presence of left artificial knee joint: Secondary | ICD-10-CM | POA: Diagnosis not present

## 2015-02-14 DIAGNOSIS — M25661 Stiffness of right knee, not elsewhere classified: Secondary | ICD-10-CM | POA: Diagnosis not present

## 2015-02-14 DIAGNOSIS — Z96651 Presence of right artificial knee joint: Secondary | ICD-10-CM | POA: Diagnosis not present

## 2015-02-17 DIAGNOSIS — Z96651 Presence of right artificial knee joint: Secondary | ICD-10-CM | POA: Diagnosis not present

## 2015-02-17 DIAGNOSIS — M25661 Stiffness of right knee, not elsewhere classified: Secondary | ICD-10-CM | POA: Diagnosis not present

## 2015-02-17 DIAGNOSIS — M25561 Pain in right knee: Secondary | ICD-10-CM | POA: Diagnosis not present

## 2015-02-17 DIAGNOSIS — Z96652 Presence of left artificial knee joint: Secondary | ICD-10-CM | POA: Diagnosis not present

## 2015-02-20 DIAGNOSIS — Z96651 Presence of right artificial knee joint: Secondary | ICD-10-CM | POA: Diagnosis not present

## 2015-02-20 DIAGNOSIS — M25661 Stiffness of right knee, not elsewhere classified: Secondary | ICD-10-CM | POA: Diagnosis not present

## 2015-02-20 DIAGNOSIS — M25561 Pain in right knee: Secondary | ICD-10-CM | POA: Diagnosis not present

## 2015-02-20 DIAGNOSIS — Z96652 Presence of left artificial knee joint: Secondary | ICD-10-CM | POA: Diagnosis not present

## 2015-02-21 DIAGNOSIS — Z471 Aftercare following joint replacement surgery: Secondary | ICD-10-CM | POA: Diagnosis not present

## 2015-02-21 DIAGNOSIS — M25561 Pain in right knee: Secondary | ICD-10-CM | POA: Diagnosis not present

## 2015-02-21 DIAGNOSIS — Z96651 Presence of right artificial knee joint: Secondary | ICD-10-CM | POA: Diagnosis not present

## 2015-02-21 DIAGNOSIS — Z96652 Presence of left artificial knee joint: Secondary | ICD-10-CM | POA: Diagnosis not present

## 2015-02-21 DIAGNOSIS — M25661 Stiffness of right knee, not elsewhere classified: Secondary | ICD-10-CM | POA: Diagnosis not present

## 2015-02-22 DIAGNOSIS — L570 Actinic keratosis: Secondary | ICD-10-CM | POA: Diagnosis not present

## 2015-02-22 DIAGNOSIS — L812 Freckles: Secondary | ICD-10-CM | POA: Diagnosis not present

## 2015-02-22 DIAGNOSIS — D229 Melanocytic nevi, unspecified: Secondary | ICD-10-CM | POA: Diagnosis not present

## 2015-02-22 DIAGNOSIS — L821 Other seborrheic keratosis: Secondary | ICD-10-CM | POA: Diagnosis not present

## 2015-02-22 DIAGNOSIS — Z85828 Personal history of other malignant neoplasm of skin: Secondary | ICD-10-CM | POA: Diagnosis not present

## 2015-02-23 DIAGNOSIS — M25661 Stiffness of right knee, not elsewhere classified: Secondary | ICD-10-CM | POA: Diagnosis not present

## 2015-02-23 DIAGNOSIS — Z96652 Presence of left artificial knee joint: Secondary | ICD-10-CM | POA: Diagnosis not present

## 2015-02-23 DIAGNOSIS — M25561 Pain in right knee: Secondary | ICD-10-CM | POA: Diagnosis not present

## 2015-02-23 DIAGNOSIS — Z96651 Presence of right artificial knee joint: Secondary | ICD-10-CM | POA: Diagnosis not present

## 2015-02-27 DIAGNOSIS — Z96652 Presence of left artificial knee joint: Secondary | ICD-10-CM | POA: Diagnosis not present

## 2015-02-27 DIAGNOSIS — Z96651 Presence of right artificial knee joint: Secondary | ICD-10-CM | POA: Diagnosis not present

## 2015-02-27 DIAGNOSIS — M25561 Pain in right knee: Secondary | ICD-10-CM | POA: Diagnosis not present

## 2015-02-27 DIAGNOSIS — M25661 Stiffness of right knee, not elsewhere classified: Secondary | ICD-10-CM | POA: Diagnosis not present

## 2015-02-28 DIAGNOSIS — Z96651 Presence of right artificial knee joint: Secondary | ICD-10-CM | POA: Diagnosis not present

## 2015-02-28 DIAGNOSIS — M25561 Pain in right knee: Secondary | ICD-10-CM | POA: Diagnosis not present

## 2015-02-28 DIAGNOSIS — M25661 Stiffness of right knee, not elsewhere classified: Secondary | ICD-10-CM | POA: Diagnosis not present

## 2015-02-28 DIAGNOSIS — Z96652 Presence of left artificial knee joint: Secondary | ICD-10-CM | POA: Diagnosis not present

## 2015-03-02 DIAGNOSIS — M25561 Pain in right knee: Secondary | ICD-10-CM | POA: Diagnosis not present

## 2015-03-02 DIAGNOSIS — M25661 Stiffness of right knee, not elsewhere classified: Secondary | ICD-10-CM | POA: Diagnosis not present

## 2015-03-02 DIAGNOSIS — Z96651 Presence of right artificial knee joint: Secondary | ICD-10-CM | POA: Diagnosis not present

## 2015-03-02 DIAGNOSIS — Z96652 Presence of left artificial knee joint: Secondary | ICD-10-CM | POA: Diagnosis not present

## 2015-03-06 DIAGNOSIS — M25561 Pain in right knee: Secondary | ICD-10-CM | POA: Diagnosis not present

## 2015-03-06 DIAGNOSIS — Z96652 Presence of left artificial knee joint: Secondary | ICD-10-CM | POA: Diagnosis not present

## 2015-03-06 DIAGNOSIS — Z96651 Presence of right artificial knee joint: Secondary | ICD-10-CM | POA: Diagnosis not present

## 2015-03-06 DIAGNOSIS — M25661 Stiffness of right knee, not elsewhere classified: Secondary | ICD-10-CM | POA: Diagnosis not present

## 2015-03-07 DIAGNOSIS — M25661 Stiffness of right knee, not elsewhere classified: Secondary | ICD-10-CM | POA: Diagnosis not present

## 2015-03-07 DIAGNOSIS — Z96652 Presence of left artificial knee joint: Secondary | ICD-10-CM | POA: Diagnosis not present

## 2015-03-07 DIAGNOSIS — M25561 Pain in right knee: Secondary | ICD-10-CM | POA: Diagnosis not present

## 2015-03-07 DIAGNOSIS — Z96651 Presence of right artificial knee joint: Secondary | ICD-10-CM | POA: Diagnosis not present

## 2015-03-09 DIAGNOSIS — M25561 Pain in right knee: Secondary | ICD-10-CM | POA: Diagnosis not present

## 2015-03-09 DIAGNOSIS — Z96651 Presence of right artificial knee joint: Secondary | ICD-10-CM | POA: Diagnosis not present

## 2015-03-09 DIAGNOSIS — Z96652 Presence of left artificial knee joint: Secondary | ICD-10-CM | POA: Diagnosis not present

## 2015-03-09 DIAGNOSIS — M25661 Stiffness of right knee, not elsewhere classified: Secondary | ICD-10-CM | POA: Diagnosis not present

## 2015-03-13 DIAGNOSIS — M25661 Stiffness of right knee, not elsewhere classified: Secondary | ICD-10-CM | POA: Diagnosis not present

## 2015-03-13 DIAGNOSIS — M25561 Pain in right knee: Secondary | ICD-10-CM | POA: Diagnosis not present

## 2015-03-13 DIAGNOSIS — Z96651 Presence of right artificial knee joint: Secondary | ICD-10-CM | POA: Diagnosis not present

## 2015-03-13 DIAGNOSIS — Z96652 Presence of left artificial knee joint: Secondary | ICD-10-CM | POA: Diagnosis not present

## 2015-03-16 DIAGNOSIS — Z96652 Presence of left artificial knee joint: Secondary | ICD-10-CM | POA: Diagnosis not present

## 2015-03-16 DIAGNOSIS — M25661 Stiffness of right knee, not elsewhere classified: Secondary | ICD-10-CM | POA: Diagnosis not present

## 2015-03-16 DIAGNOSIS — M25561 Pain in right knee: Secondary | ICD-10-CM | POA: Diagnosis not present

## 2015-03-16 DIAGNOSIS — Z96651 Presence of right artificial knee joint: Secondary | ICD-10-CM | POA: Diagnosis not present

## 2015-03-16 DIAGNOSIS — Z9889 Other specified postprocedural states: Secondary | ICD-10-CM | POA: Diagnosis not present

## 2015-03-20 DIAGNOSIS — Z96652 Presence of left artificial knee joint: Secondary | ICD-10-CM | POA: Diagnosis not present

## 2015-03-20 DIAGNOSIS — M25661 Stiffness of right knee, not elsewhere classified: Secondary | ICD-10-CM | POA: Diagnosis not present

## 2015-03-20 DIAGNOSIS — M25561 Pain in right knee: Secondary | ICD-10-CM | POA: Diagnosis not present

## 2015-03-20 DIAGNOSIS — Z96651 Presence of right artificial knee joint: Secondary | ICD-10-CM | POA: Diagnosis not present

## 2015-03-21 DIAGNOSIS — M25561 Pain in right knee: Secondary | ICD-10-CM | POA: Diagnosis not present

## 2015-03-21 DIAGNOSIS — Z96651 Presence of right artificial knee joint: Secondary | ICD-10-CM | POA: Diagnosis not present

## 2015-03-21 DIAGNOSIS — M9905 Segmental and somatic dysfunction of pelvic region: Secondary | ICD-10-CM | POA: Diagnosis not present

## 2015-03-21 DIAGNOSIS — M25661 Stiffness of right knee, not elsewhere classified: Secondary | ICD-10-CM | POA: Diagnosis not present

## 2015-03-21 DIAGNOSIS — M9902 Segmental and somatic dysfunction of thoracic region: Secondary | ICD-10-CM | POA: Diagnosis not present

## 2015-03-21 DIAGNOSIS — Z96652 Presence of left artificial knee joint: Secondary | ICD-10-CM | POA: Diagnosis not present

## 2015-03-21 DIAGNOSIS — M5134 Other intervertebral disc degeneration, thoracic region: Secondary | ICD-10-CM | POA: Diagnosis not present

## 2015-03-21 DIAGNOSIS — M9903 Segmental and somatic dysfunction of lumbar region: Secondary | ICD-10-CM | POA: Diagnosis not present

## 2015-03-21 DIAGNOSIS — M5431 Sciatica, right side: Secondary | ICD-10-CM | POA: Diagnosis not present

## 2015-03-21 DIAGNOSIS — M5137 Other intervertebral disc degeneration, lumbosacral region: Secondary | ICD-10-CM | POA: Diagnosis not present

## 2015-03-23 DIAGNOSIS — M5134 Other intervertebral disc degeneration, thoracic region: Secondary | ICD-10-CM | POA: Diagnosis not present

## 2015-03-23 DIAGNOSIS — M9903 Segmental and somatic dysfunction of lumbar region: Secondary | ICD-10-CM | POA: Diagnosis not present

## 2015-03-23 DIAGNOSIS — M9905 Segmental and somatic dysfunction of pelvic region: Secondary | ICD-10-CM | POA: Diagnosis not present

## 2015-03-23 DIAGNOSIS — M9902 Segmental and somatic dysfunction of thoracic region: Secondary | ICD-10-CM | POA: Diagnosis not present

## 2015-03-23 DIAGNOSIS — M25661 Stiffness of right knee, not elsewhere classified: Secondary | ICD-10-CM | POA: Diagnosis not present

## 2015-03-23 DIAGNOSIS — M5137 Other intervertebral disc degeneration, lumbosacral region: Secondary | ICD-10-CM | POA: Diagnosis not present

## 2015-03-23 DIAGNOSIS — Z96652 Presence of left artificial knee joint: Secondary | ICD-10-CM | POA: Diagnosis not present

## 2015-03-23 DIAGNOSIS — M25561 Pain in right knee: Secondary | ICD-10-CM | POA: Diagnosis not present

## 2015-03-23 DIAGNOSIS — Z96651 Presence of right artificial knee joint: Secondary | ICD-10-CM | POA: Diagnosis not present

## 2015-03-23 DIAGNOSIS — M5431 Sciatica, right side: Secondary | ICD-10-CM | POA: Diagnosis not present

## 2015-03-27 DIAGNOSIS — M25561 Pain in right knee: Secondary | ICD-10-CM | POA: Diagnosis not present

## 2015-03-27 DIAGNOSIS — Z96651 Presence of right artificial knee joint: Secondary | ICD-10-CM | POA: Diagnosis not present

## 2015-03-27 DIAGNOSIS — Z96652 Presence of left artificial knee joint: Secondary | ICD-10-CM | POA: Diagnosis not present

## 2015-03-27 DIAGNOSIS — M25661 Stiffness of right knee, not elsewhere classified: Secondary | ICD-10-CM | POA: Diagnosis not present

## 2015-03-28 DIAGNOSIS — M9905 Segmental and somatic dysfunction of pelvic region: Secondary | ICD-10-CM | POA: Diagnosis not present

## 2015-03-28 DIAGNOSIS — M9902 Segmental and somatic dysfunction of thoracic region: Secondary | ICD-10-CM | POA: Diagnosis not present

## 2015-03-28 DIAGNOSIS — M5431 Sciatica, right side: Secondary | ICD-10-CM | POA: Diagnosis not present

## 2015-03-28 DIAGNOSIS — M25561 Pain in right knee: Secondary | ICD-10-CM | POA: Diagnosis not present

## 2015-03-28 DIAGNOSIS — Z471 Aftercare following joint replacement surgery: Secondary | ICD-10-CM | POA: Diagnosis not present

## 2015-03-28 DIAGNOSIS — M5134 Other intervertebral disc degeneration, thoracic region: Secondary | ICD-10-CM | POA: Diagnosis not present

## 2015-03-28 DIAGNOSIS — M25661 Stiffness of right knee, not elsewhere classified: Secondary | ICD-10-CM | POA: Diagnosis not present

## 2015-03-28 DIAGNOSIS — M7061 Trochanteric bursitis, right hip: Secondary | ICD-10-CM | POA: Diagnosis not present

## 2015-03-28 DIAGNOSIS — M5137 Other intervertebral disc degeneration, lumbosacral region: Secondary | ICD-10-CM | POA: Diagnosis not present

## 2015-03-28 DIAGNOSIS — Z96651 Presence of right artificial knee joint: Secondary | ICD-10-CM | POA: Diagnosis not present

## 2015-03-28 DIAGNOSIS — M9903 Segmental and somatic dysfunction of lumbar region: Secondary | ICD-10-CM | POA: Diagnosis not present

## 2015-03-28 DIAGNOSIS — Z96652 Presence of left artificial knee joint: Secondary | ICD-10-CM | POA: Diagnosis not present

## 2015-03-30 DIAGNOSIS — M25561 Pain in right knee: Secondary | ICD-10-CM | POA: Diagnosis not present

## 2015-03-30 DIAGNOSIS — Z96651 Presence of right artificial knee joint: Secondary | ICD-10-CM | POA: Diagnosis not present

## 2015-03-30 DIAGNOSIS — Z96652 Presence of left artificial knee joint: Secondary | ICD-10-CM | POA: Diagnosis not present

## 2015-03-30 DIAGNOSIS — M25661 Stiffness of right knee, not elsewhere classified: Secondary | ICD-10-CM | POA: Diagnosis not present

## 2015-04-10 DIAGNOSIS — M25561 Pain in right knee: Secondary | ICD-10-CM | POA: Diagnosis not present

## 2015-04-10 DIAGNOSIS — M25562 Pain in left knee: Secondary | ICD-10-CM | POA: Diagnosis not present

## 2015-04-11 DIAGNOSIS — K219 Gastro-esophageal reflux disease without esophagitis: Secondary | ICD-10-CM | POA: Diagnosis not present

## 2015-04-11 DIAGNOSIS — E785 Hyperlipidemia, unspecified: Secondary | ICD-10-CM | POA: Diagnosis not present

## 2015-04-11 DIAGNOSIS — I10 Essential (primary) hypertension: Secondary | ICD-10-CM | POA: Diagnosis not present

## 2015-04-11 DIAGNOSIS — I2510 Atherosclerotic heart disease of native coronary artery without angina pectoris: Secondary | ICD-10-CM | POA: Diagnosis not present

## 2015-04-11 DIAGNOSIS — Z955 Presence of coronary angioplasty implant and graft: Secondary | ICD-10-CM | POA: Diagnosis not present

## 2015-04-12 DIAGNOSIS — G479 Sleep disorder, unspecified: Secondary | ICD-10-CM | POA: Diagnosis not present

## 2015-04-12 DIAGNOSIS — Z7189 Other specified counseling: Secondary | ICD-10-CM | POA: Diagnosis not present

## 2015-04-12 DIAGNOSIS — Z96653 Presence of artificial knee joint, bilateral: Secondary | ICD-10-CM | POA: Diagnosis not present

## 2015-04-12 DIAGNOSIS — G4700 Insomnia, unspecified: Secondary | ICD-10-CM | POA: Diagnosis not present

## 2015-04-12 DIAGNOSIS — Z955 Presence of coronary angioplasty implant and graft: Secondary | ICD-10-CM | POA: Diagnosis not present

## 2015-04-30 ENCOUNTER — Encounter: Payer: Self-pay | Admitting: Adult Health

## 2015-04-30 NOTE — Progress Notes (Signed)
Patient ID: Jacob Parker, male   DOB: 10-25-1951, 64 y.o.   MRN: [id number]   11/22/14  Facility:  Nursing Home Location:  [facility] Room Number: 705-P LEVEL OF CARE:  SNF (31)   Chief Complaint  Patient presents with  . Hospitalization Follow-up    Osteoarthritis S/P left total knee arthroplasty, hypertension, hyperlipidemia, constipation, rashes and GERD    HISTORY OF PRESENT ILLNESS:  This is a 64 year old male who has been admitted to [facility] on 11/18/14 from [facility] with osteoarthritis S/P left total knee arthroplasty. He has PMH of skin cancer, CAD, hyperlipidemia and hypertension.  He has been admitted for a short-term rehabilitation.  PAST MEDICAL HISTORY:  Past Medical History  Diagnosis Date  . Hypertension   . Hyperlipidemia   . DJD (degenerative joint disease)   . History of skin cancer 1981 / 2015  . Stricture esophagus   . Sleep apnea     uses c pap  . GERD (gastroesophageal reflux disease)   . Coronary artery disease 2011    has 2 stents in [place]; as if 10/2014 followed by Dr. Wynonia Lawman in [place]: Reviewed per MAR/see medication list  No Known Allergies   REVIEW OF SYSTEMS:  GENERAL: no change in appetite, no fatigue, no weight changes, no fever, chills or weakness RESPIRATORY: no cough, SOB, DOE, wheezing, hemoptysis CARDIAC: no chest pain, edema or palpitations GI: no abdominal pain, diarrhea, constipation, heart burn, nausea or vomiting  PHYSICAL EXAMINATION  GENERAL: no acute distress, normal body habitus SKIN:  Erythematous rashes on back;  Left knee surgical site is dry, no redness EYES: conjunctivae normal, sclerae normal, normal eye lids NECK: supple, trachea midline, no neck masses, no thyroid tenderness, no thyromegaly LYMPHATICS: no LAN in the neck, no supraclavicular LAN RESPIRATORY: breathing is even & unlabored, BS CTAB CARDIAC: RRR, no murmur,no extra heart  sounds, no edema GI: abdomen soft, normal BS, no masses, no tenderness, no hepatomegaly, no splenomegaly EXTREMITIES:  Able to move 4 extremities PSYCHIATRIC: the patient is alert & oriented to person, affect & behavior appropriate  LABS/RADIOLOGY: Labs reviewed:  Recent Labs   CBC    11/08/14 0920      WBC 5.5  < >     NEUTROABS 2.8  --      HGB 14.8  < >     HCT 44.0  < >     MCV 89.4  < >     PLT 202  < >     < > = values in this interval not displayed.  Liver function test  11/08/14 0920    AST 37    ALT 64*    ALKPHOS 91    BILITOT 0.7    PROT 7.4    ALBUMIN 3.9      Metabolic panel  42/70/62 3762    NA 138    K 3.7    CL 108    CO2 25    GLUCOSE 126*    BUN 9    CREATININE 0.97    CALCIUM 8.9      ASSESSMENT/PLAN:  Osteoarthritis S/P left total knee arthroplasty - for rehabilitation; continue aspirin 325 mg 1 tab by mouth twice a day for DVT prophylaxis, Robaxin 500 mg 1 tab by mouth every 8 hours when necessary for muscle spasm and Percocet 5/325 mg 1-2 tabs by mouth every 4 hours when necessary Hypertension - well  controlled; continue lisinopril 40 mg 1 tab by mouth daily and Bystolic 5 mg by mouth every morning Hyperlipidemia - continue Crestor 40 mg by mouth every evening Constipation - continue senna S1 by mouth twice a day GERD - continue Nexium 20 mg by mouth daily Rash on skin back - start hydrocortisone 1% cream to rashes on back twice a day 2 weeks   Goals of care:  Short-term rehabilitation  Labs/test ordered:  none  Spent 50 minutes in patient care.   [facility], NP [employer] [phone]

## 2015-05-29 DIAGNOSIS — G4733 Obstructive sleep apnea (adult) (pediatric): Secondary | ICD-10-CM | POA: Diagnosis not present

## 2015-05-30 DIAGNOSIS — Z96652 Presence of left artificial knee joint: Secondary | ICD-10-CM | POA: Diagnosis not present

## 2015-05-30 DIAGNOSIS — M170 Bilateral primary osteoarthritis of knee: Secondary | ICD-10-CM | POA: Diagnosis not present

## 2015-05-30 DIAGNOSIS — Z96651 Presence of right artificial knee joint: Secondary | ICD-10-CM | POA: Diagnosis not present

## 2015-05-30 DIAGNOSIS — Z09 Encounter for follow-up examination after completed treatment for conditions other than malignant neoplasm: Secondary | ICD-10-CM | POA: Diagnosis not present

## 2015-06-15 DIAGNOSIS — G4733 Obstructive sleep apnea (adult) (pediatric): Secondary | ICD-10-CM | POA: Diagnosis not present

## 2015-08-29 DIAGNOSIS — H6691 Otitis media, unspecified, right ear: Secondary | ICD-10-CM | POA: Diagnosis not present

## 2015-09-11 DIAGNOSIS — J329 Chronic sinusitis, unspecified: Secondary | ICD-10-CM | POA: Diagnosis not present

## 2015-09-11 DIAGNOSIS — H6090 Unspecified otitis externa, unspecified ear: Secondary | ICD-10-CM | POA: Diagnosis not present

## 2015-09-18 DIAGNOSIS — G4733 Obstructive sleep apnea (adult) (pediatric): Secondary | ICD-10-CM | POA: Diagnosis not present

## 2015-10-24 DIAGNOSIS — I10 Essential (primary) hypertension: Secondary | ICD-10-CM | POA: Diagnosis not present

## 2015-10-24 DIAGNOSIS — H5200 Hypermetropia, unspecified eye: Secondary | ICD-10-CM | POA: Diagnosis not present

## 2015-10-24 DIAGNOSIS — H2510 Age-related nuclear cataract, unspecified eye: Secondary | ICD-10-CM | POA: Diagnosis not present

## 2015-10-24 DIAGNOSIS — H5210 Myopia, unspecified eye: Secondary | ICD-10-CM | POA: Diagnosis not present

## 2015-11-02 DIAGNOSIS — Z85828 Personal history of other malignant neoplasm of skin: Secondary | ICD-10-CM | POA: Diagnosis not present

## 2015-11-02 DIAGNOSIS — L570 Actinic keratosis: Secondary | ICD-10-CM | POA: Diagnosis not present

## 2015-11-02 DIAGNOSIS — D225 Melanocytic nevi of trunk: Secondary | ICD-10-CM | POA: Diagnosis not present

## 2015-11-02 DIAGNOSIS — D1801 Hemangioma of skin and subcutaneous tissue: Secondary | ICD-10-CM | POA: Diagnosis not present

## 2015-11-02 DIAGNOSIS — D485 Neoplasm of uncertain behavior of skin: Secondary | ICD-10-CM | POA: Diagnosis not present

## 2015-11-02 DIAGNOSIS — L821 Other seborrheic keratosis: Secondary | ICD-10-CM | POA: Diagnosis not present

## 2015-11-02 DIAGNOSIS — L814 Other melanin hyperpigmentation: Secondary | ICD-10-CM | POA: Diagnosis not present

## 2015-11-02 DIAGNOSIS — D0461 Carcinoma in situ of skin of right upper limb, including shoulder: Secondary | ICD-10-CM | POA: Diagnosis not present

## 2015-11-15 DIAGNOSIS — Z85828 Personal history of other malignant neoplasm of skin: Secondary | ICD-10-CM | POA: Diagnosis not present

## 2015-11-15 DIAGNOSIS — M2550 Pain in unspecified joint: Secondary | ICD-10-CM | POA: Diagnosis not present

## 2015-11-15 DIAGNOSIS — L570 Actinic keratosis: Secondary | ICD-10-CM | POA: Diagnosis not present

## 2015-11-15 DIAGNOSIS — G479 Sleep disorder, unspecified: Secondary | ICD-10-CM | POA: Diagnosis not present

## 2015-11-15 DIAGNOSIS — Z8589 Personal history of malignant neoplasm of other organs and systems: Secondary | ICD-10-CM | POA: Diagnosis not present

## 2015-11-15 DIAGNOSIS — D0461 Carcinoma in situ of skin of right upper limb, including shoulder: Secondary | ICD-10-CM | POA: Diagnosis not present

## 2015-11-27 DIAGNOSIS — Z96652 Presence of left artificial knee joint: Secondary | ICD-10-CM | POA: Diagnosis not present

## 2015-11-27 DIAGNOSIS — Z96651 Presence of right artificial knee joint: Secondary | ICD-10-CM | POA: Diagnosis not present

## 2015-11-27 DIAGNOSIS — Z09 Encounter for follow-up examination after completed treatment for conditions other than malignant neoplasm: Secondary | ICD-10-CM | POA: Diagnosis not present

## 2015-11-28 ENCOUNTER — Other Ambulatory Visit: Payer: Self-pay

## 2015-11-28 NOTE — Patient Outreach (Signed)
[facility]) Care Management  11/28/2015  Jacob Parker 06/28/1951 DX:[id number]  Telephone Screen  Referral Date: 11/27/15 Referral [facility] tier 4 list Referral Reason:"diagnosis not listed with 2 ED visits and 2 admits"  Outreach attempt # 1 to patient. Patient reached. Screening completed. Discussed and reviewed [facility] services. Patient remains independent with managing his care. He declined any CM needs or concerns at this time. Patient was not interested in health coach services for disease management and education.  Plan: RN CM will notify [facility] administrative assistant that patient declined services and case closed. RN CM will send case closure letter to MD.  [name], RN,BSN,CCM [facility] Telephonic Care Management Coordinator Direct Phone: [phone] Toll Free: [phone] Fax: [phone]

## 2015-12-19 DIAGNOSIS — G4733 Obstructive sleep apnea (adult) (pediatric): Secondary | ICD-10-CM | POA: Diagnosis not present

## 2016-02-02 DIAGNOSIS — Z8042 Family history of malignant neoplasm of prostate: Secondary | ICD-10-CM | POA: Diagnosis not present

## 2016-02-02 DIAGNOSIS — Z79899 Other long term (current) drug therapy: Secondary | ICD-10-CM | POA: Diagnosis not present

## 2016-02-02 DIAGNOSIS — Z23 Encounter for immunization: Secondary | ICD-10-CM | POA: Diagnosis not present

## 2016-02-02 DIAGNOSIS — Z125 Encounter for screening for malignant neoplasm of prostate: Secondary | ICD-10-CM | POA: Diagnosis not present

## 2016-02-02 DIAGNOSIS — N400 Enlarged prostate without lower urinary tract symptoms: Secondary | ICD-10-CM | POA: Diagnosis not present

## 2016-02-02 DIAGNOSIS — Z0000 Encounter for general adult medical examination without abnormal findings: Secondary | ICD-10-CM | POA: Diagnosis not present

## 2016-02-02 DIAGNOSIS — E781 Pure hyperglyceridemia: Secondary | ICD-10-CM | POA: Diagnosis not present

## 2016-02-02 DIAGNOSIS — H9190 Unspecified hearing loss, unspecified ear: Secondary | ICD-10-CM | POA: Diagnosis not present

## 2016-02-02 DIAGNOSIS — Z1389 Encounter for screening for other disorder: Secondary | ICD-10-CM | POA: Diagnosis not present

## 2016-02-02 DIAGNOSIS — I10 Essential (primary) hypertension: Secondary | ICD-10-CM | POA: Diagnosis not present

## 2016-02-06 DIAGNOSIS — Z818 Family history of other mental and behavioral disorders: Secondary | ICD-10-CM | POA: Diagnosis not present

## 2016-02-27 DIAGNOSIS — R1310 Dysphagia, unspecified: Secondary | ICD-10-CM | POA: Diagnosis not present

## 2016-02-27 DIAGNOSIS — Z86010 Personal history of colonic polyps: Secondary | ICD-10-CM | POA: Diagnosis not present

## 2016-02-27 DIAGNOSIS — K219 Gastro-esophageal reflux disease without esophagitis: Secondary | ICD-10-CM | POA: Diagnosis not present

## 2016-03-06 DIAGNOSIS — L821 Other seborrheic keratosis: Secondary | ICD-10-CM | POA: Diagnosis not present

## 2016-03-06 DIAGNOSIS — L812 Freckles: Secondary | ICD-10-CM | POA: Diagnosis not present

## 2016-03-06 DIAGNOSIS — D225 Melanocytic nevi of trunk: Secondary | ICD-10-CM | POA: Diagnosis not present

## 2016-03-06 DIAGNOSIS — L570 Actinic keratosis: Secondary | ICD-10-CM | POA: Diagnosis not present

## 2016-03-08 DIAGNOSIS — H9193 Unspecified hearing loss, bilateral: Secondary | ICD-10-CM | POA: Insufficient documentation

## 2016-03-08 DIAGNOSIS — H6123 Impacted cerumen, bilateral: Secondary | ICD-10-CM | POA: Insufficient documentation

## 2016-03-08 DIAGNOSIS — J343 Hypertrophy of nasal turbinates: Secondary | ICD-10-CM | POA: Diagnosis not present

## 2016-03-08 DIAGNOSIS — J342 Deviated nasal septum: Secondary | ICD-10-CM | POA: Diagnosis not present

## 2016-03-08 DIAGNOSIS — H903 Sensorineural hearing loss, bilateral: Secondary | ICD-10-CM | POA: Diagnosis not present

## 2016-03-08 DIAGNOSIS — G4733 Obstructive sleep apnea (adult) (pediatric): Secondary | ICD-10-CM

## 2016-03-08 DIAGNOSIS — Z9989 Dependence on other enabling machines and devices: Secondary | ICD-10-CM | POA: Diagnosis not present

## 2016-03-08 HISTORY — DX: Impacted cerumen, bilateral: H61.23

## 2016-03-08 HISTORY — DX: Obstructive sleep apnea (adult) (pediatric): G47.33

## 2016-03-08 HISTORY — DX: Unspecified hearing loss, bilateral: H91.93

## 2016-03-18 DIAGNOSIS — G4733 Obstructive sleep apnea (adult) (pediatric): Secondary | ICD-10-CM | POA: Diagnosis not present

## 2016-04-09 DIAGNOSIS — I10 Essential (primary) hypertension: Secondary | ICD-10-CM | POA: Diagnosis not present

## 2016-04-09 DIAGNOSIS — K219 Gastro-esophageal reflux disease without esophagitis: Secondary | ICD-10-CM | POA: Diagnosis not present

## 2016-04-09 DIAGNOSIS — I2510 Atherosclerotic heart disease of native coronary artery without angina pectoris: Secondary | ICD-10-CM | POA: Diagnosis not present

## 2016-04-09 DIAGNOSIS — Z955 Presence of coronary angioplasty implant and graft: Secondary | ICD-10-CM | POA: Diagnosis not present

## 2016-04-09 DIAGNOSIS — E785 Hyperlipidemia, unspecified: Secondary | ICD-10-CM | POA: Diagnosis not present

## 2016-04-12 DIAGNOSIS — K3189 Other diseases of stomach and duodenum: Secondary | ICD-10-CM | POA: Diagnosis not present

## 2016-04-12 DIAGNOSIS — R1310 Dysphagia, unspecified: Secondary | ICD-10-CM | POA: Diagnosis not present

## 2016-04-12 DIAGNOSIS — K219 Gastro-esophageal reflux disease without esophagitis: Secondary | ICD-10-CM | POA: Diagnosis not present

## 2016-04-12 DIAGNOSIS — K317 Polyp of stomach and duodenum: Secondary | ICD-10-CM | POA: Diagnosis not present

## 2016-04-12 DIAGNOSIS — D124 Benign neoplasm of descending colon: Secondary | ICD-10-CM | POA: Diagnosis not present

## 2016-04-12 DIAGNOSIS — K640 First degree hemorrhoids: Secondary | ICD-10-CM | POA: Diagnosis not present

## 2016-04-12 DIAGNOSIS — D126 Benign neoplasm of colon, unspecified: Secondary | ICD-10-CM | POA: Diagnosis not present

## 2016-04-12 DIAGNOSIS — Z86010 Personal history of colonic polyps: Secondary | ICD-10-CM | POA: Diagnosis not present

## 2016-04-29 DIAGNOSIS — M5134 Other intervertebral disc degeneration, thoracic region: Secondary | ICD-10-CM | POA: Diagnosis not present

## 2016-04-29 DIAGNOSIS — M5431 Sciatica, right side: Secondary | ICD-10-CM | POA: Diagnosis not present

## 2016-04-29 DIAGNOSIS — M5137 Other intervertebral disc degeneration, lumbosacral region: Secondary | ICD-10-CM | POA: Diagnosis not present

## 2016-04-29 DIAGNOSIS — M9902 Segmental and somatic dysfunction of thoracic region: Secondary | ICD-10-CM | POA: Diagnosis not present

## 2016-04-29 DIAGNOSIS — M9903 Segmental and somatic dysfunction of lumbar region: Secondary | ICD-10-CM | POA: Diagnosis not present

## 2016-04-29 DIAGNOSIS — M9905 Segmental and somatic dysfunction of pelvic region: Secondary | ICD-10-CM | POA: Diagnosis not present

## 2016-05-26 DIAGNOSIS — J208 Acute bronchitis due to other specified organisms: Secondary | ICD-10-CM | POA: Diagnosis not present

## 2016-05-26 DIAGNOSIS — J019 Acute sinusitis, unspecified: Secondary | ICD-10-CM | POA: Diagnosis not present

## 2016-05-28 DIAGNOSIS — H903 Sensorineural hearing loss, bilateral: Secondary | ICD-10-CM | POA: Diagnosis not present

## 2016-05-28 DIAGNOSIS — J069 Acute upper respiratory infection, unspecified: Secondary | ICD-10-CM | POA: Diagnosis not present

## 2016-06-13 DIAGNOSIS — H43311 Vitreous membranes and strands, right eye: Secondary | ICD-10-CM | POA: Diagnosis not present

## 2016-06-19 DIAGNOSIS — G4733 Obstructive sleep apnea (adult) (pediatric): Secondary | ICD-10-CM | POA: Diagnosis not present

## 2016-09-20 DIAGNOSIS — G4733 Obstructive sleep apnea (adult) (pediatric): Secondary | ICD-10-CM | POA: Diagnosis not present

## 2016-10-15 DIAGNOSIS — L814 Other melanin hyperpigmentation: Secondary | ICD-10-CM | POA: Diagnosis not present

## 2016-10-15 DIAGNOSIS — L570 Actinic keratosis: Secondary | ICD-10-CM | POA: Diagnosis not present

## 2016-10-15 DIAGNOSIS — D1801 Hemangioma of skin and subcutaneous tissue: Secondary | ICD-10-CM | POA: Diagnosis not present

## 2016-10-15 DIAGNOSIS — Z85828 Personal history of other malignant neoplasm of skin: Secondary | ICD-10-CM | POA: Diagnosis not present

## 2016-10-15 DIAGNOSIS — L821 Other seborrheic keratosis: Secondary | ICD-10-CM | POA: Diagnosis not present

## 2016-10-22 DIAGNOSIS — M79672 Pain in left foot: Secondary | ICD-10-CM | POA: Diagnosis not present

## 2016-10-22 DIAGNOSIS — Z23 Encounter for immunization: Secondary | ICD-10-CM | POA: Diagnosis not present

## 2016-10-28 DIAGNOSIS — Z96651 Presence of right artificial knee joint: Secondary | ICD-10-CM | POA: Diagnosis not present

## 2016-10-28 DIAGNOSIS — Z981 Arthrodesis status: Secondary | ICD-10-CM | POA: Diagnosis not present

## 2016-10-28 DIAGNOSIS — M79672 Pain in left foot: Secondary | ICD-10-CM | POA: Diagnosis not present

## 2016-10-28 DIAGNOSIS — Z96652 Presence of left artificial knee joint: Secondary | ICD-10-CM | POA: Diagnosis not present

## 2016-10-28 DIAGNOSIS — M1712 Unilateral primary osteoarthritis, left knee: Secondary | ICD-10-CM | POA: Diagnosis not present

## 2016-10-28 DIAGNOSIS — M19072 Primary osteoarthritis, left ankle and foot: Secondary | ICD-10-CM | POA: Diagnosis not present

## 2016-10-28 DIAGNOSIS — M25572 Pain in left ankle and joints of left foot: Secondary | ICD-10-CM | POA: Diagnosis not present

## 2016-10-28 DIAGNOSIS — M1711 Unilateral primary osteoarthritis, right knee: Secondary | ICD-10-CM | POA: Diagnosis not present

## 2016-11-12 DIAGNOSIS — Z981 Arthrodesis status: Secondary | ICD-10-CM | POA: Diagnosis not present

## 2016-11-12 DIAGNOSIS — M2170 Unequal limb length (acquired), unspecified site: Secondary | ICD-10-CM | POA: Diagnosis not present

## 2016-11-12 DIAGNOSIS — M19072 Primary osteoarthritis, left ankle and foot: Secondary | ICD-10-CM | POA: Diagnosis not present

## 2016-12-24 DIAGNOSIS — G4733 Obstructive sleep apnea (adult) (pediatric): Secondary | ICD-10-CM | POA: Diagnosis not present

## 2016-12-28 ENCOUNTER — Emergency Department (HOSPITAL_COMMUNITY): Payer: Medicare HMO

## 2016-12-28 ENCOUNTER — Inpatient Hospital Stay (HOSPITAL_COMMUNITY)
Admission: EM | Admit: 2016-12-28 | Discharge: 2016-12-30 | DRG: 246 | Disposition: A | Discharge status: Home / Self Care | Payer: Medicare HMO | Attending: Internal Medicine | Admitting: Internal Medicine

## 2016-12-28 ENCOUNTER — Encounter (HOSPITAL_COMMUNITY): Payer: Self-pay

## 2016-12-28 DIAGNOSIS — K222 Esophageal obstruction: Secondary | ICD-10-CM | POA: Insufficient documentation

## 2016-12-28 DIAGNOSIS — Z79899 Other long term (current) drug therapy: Secondary | ICD-10-CM

## 2016-12-28 DIAGNOSIS — I200 Unstable angina: Secondary | ICD-10-CM

## 2016-12-28 DIAGNOSIS — R079 Chest pain, unspecified: Secondary | ICD-10-CM

## 2016-12-28 DIAGNOSIS — G4730 Sleep apnea, unspecified: Secondary | ICD-10-CM | POA: Diagnosis present

## 2016-12-28 DIAGNOSIS — I214 Non-ST elevation (NSTEMI) myocardial infarction: Secondary | ICD-10-CM | POA: Diagnosis present

## 2016-12-28 DIAGNOSIS — E7849 Other hyperlipidemia: Secondary | ICD-10-CM

## 2016-12-28 DIAGNOSIS — E785 Hyperlipidemia, unspecified: Secondary | ICD-10-CM | POA: Diagnosis present

## 2016-12-28 DIAGNOSIS — Z96653 Presence of artificial knee joint, bilateral: Secondary | ICD-10-CM | POA: Diagnosis present

## 2016-12-28 DIAGNOSIS — I2510 Atherosclerotic heart disease of native coronary artery without angina pectoris: Secondary | ICD-10-CM | POA: Diagnosis present

## 2016-12-28 DIAGNOSIS — Z7982 Long term (current) use of aspirin: Secondary | ICD-10-CM

## 2016-12-28 DIAGNOSIS — Z85828 Personal history of other malignant neoplasm of skin: Secondary | ICD-10-CM

## 2016-12-28 DIAGNOSIS — K219 Gastro-esophageal reflux disease without esophagitis: Secondary | ICD-10-CM | POA: Diagnosis present

## 2016-12-28 DIAGNOSIS — T82858A Stenosis of vascular prosthetic devices, implants and grafts, initial encounter: Principal | ICD-10-CM | POA: Diagnosis present

## 2016-12-28 DIAGNOSIS — Z955 Presence of coronary angioplasty implant and graft: Secondary | ICD-10-CM | POA: Diagnosis not present

## 2016-12-28 DIAGNOSIS — Z9889 Other specified postprocedural states: Secondary | ICD-10-CM | POA: Diagnosis not present

## 2016-12-28 DIAGNOSIS — Y831 Surgical operation with implant of artificial internal device as the cause of abnormal reaction of the patient, or of later complication, without mention of misadventure at the time of the procedure: Secondary | ICD-10-CM | POA: Diagnosis present

## 2016-12-28 DIAGNOSIS — R0789 Other chest pain: Secondary | ICD-10-CM | POA: Diagnosis not present

## 2016-12-28 DIAGNOSIS — E784 Other hyperlipidemia: Secondary | ICD-10-CM | POA: Diagnosis not present

## 2016-12-28 DIAGNOSIS — I10 Essential (primary) hypertension: Secondary | ICD-10-CM | POA: Diagnosis present

## 2016-12-28 DIAGNOSIS — Z96652 Presence of left artificial knee joint: Secondary | ICD-10-CM

## 2016-12-28 HISTORY — DX: Essential (primary) hypertension: I10

## 2016-12-28 LAB — BASIC METABOLIC PANEL
ANION GAP: 11 (ref 5–15)
BUN: 15 mg/dL (ref 6–20)
CHLORIDE: 104 mmol/L (ref 101–111)
CO2: 22 mmol/L (ref 22–32)
Calcium: 9.3 mg/dL (ref 8.9–10.3)
Creatinine, Ser: 1.15 mg/dL (ref 0.61–1.24)
GFR calc Af Amer: >60 mL/min (ref >60)
GFR calc non Af Amer: >60 mL/min (ref >60)
Glucose, Bld: 97 mg/dL (ref 65–99)
Potassium: 4.1 mmol/L (ref 3.5–5.1)
Sodium: 137 mmol/L (ref 135–145)

## 2016-12-28 LAB — CBC
HCT: 42.2 % (ref 39.0–52.0)
HEMOGLOBIN: 14.9 g/dL (ref 13.0–17.0)
MCH: 31.2 pg (ref 26.0–34.0)
MCHC: 35.3 g/dL (ref 30.0–36.0)
MCV: 88.3 fL (ref 78.0–100.0)
Platelets: 194 10*3/uL (ref 150–400)
RBC: 4.78 MIL/uL (ref 4.22–5.81)
RDW: 12.9 % (ref 11.5–15.5)
WBC: 5.8 10*3/uL (ref 4.0–10.5)

## 2016-12-28 LAB — I-STAT TROPONIN, ED: Troponin i, poc: 0 ng/mL (ref 0.00–0.08)

## 2016-12-28 LAB — LIPID PANEL
CHOLESTEROL: 156 mg/dL (ref 0–200)
HDL: 47 mg/dL (ref >40)
LDL Cholesterol: 79 mg/dL (ref 0–99)
TRIGLYCERIDES: 150 mg/dL — AB (ref <150)
Total CHOL/HDL Ratio: 3.3 RATIO
VLDL: 30 mg/dL (ref 0–40)

## 2016-12-28 LAB — D-DIMER, QUANTITATIVE (NOT AT ARMC): D-Dimer, Quant: 1.23 ug/mL-FEU — ABNORMAL HIGH (ref 0.00–0.50)

## 2016-12-28 LAB — TROPONIN I
TROPONIN I: 0.18 ng/mL — AB (ref <0.03)
Troponin I: 0.06 ng/mL (ref <0.03)

## 2016-12-28 LAB — HEPARIN LEVEL (UNFRACTIONATED): HEPARIN UNFRACTIONATED: 0.54 [IU]/mL (ref 0.30–0.70)

## 2016-12-28 MED ORDER — GI COCKTAIL ~~LOC~~: 30.0000 mL | Freq: Once | ORAL | Status: DC

## 2016-12-28 MED ORDER — IOPAMIDOL (ISOVUE-370) INJECTION 76%
INTRAVENOUS | Status: AC
  Administered 2016-12-28: 80 mL
  Filled 2016-12-28: qty 100

## 2016-12-28 MED ORDER — HEPARIN BOLUS VIA INFUSION
4000.0000 [IU] | Freq: Once | INTRAVENOUS | Status: AC
  Administered 2016-12-28: 4000 [IU] via INTRAVENOUS
  Filled 2016-12-28: qty 4000

## 2016-12-28 MED ORDER — NEBIVOLOL HCL 5 MG PO TABS
5.0000 mg | ORAL_TABLET | Freq: Every day | ORAL | Status: DC
  Administered 2016-12-29: 5 mg via ORAL
  Filled 2016-12-28 (×2): qty 1

## 2016-12-28 MED ORDER — LISINOPRIL 10 MG PO TABS
20.0000 mg | ORAL_TABLET | Freq: Every day | ORAL | Status: DC
  Administered 2016-12-28 – 2016-12-29 (×2): 20 mg via ORAL
  Filled 2016-12-28 (×2): qty 2

## 2016-12-28 MED ORDER — PANTOPRAZOLE SODIUM 40 MG PO TBEC
40.0000 mg | DELAYED_RELEASE_TABLET | Freq: Every day | ORAL | Status: DC
  Administered 2016-12-29: 40 mg via ORAL
  Filled 2016-12-28 (×2): qty 1

## 2016-12-28 MED ORDER — ACETAMINOPHEN 325 MG PO TABS: 650.0000 mg | ORAL_TABLET | ORAL | Status: DC | PRN

## 2016-12-28 MED ORDER — ZOLPIDEM TARTRATE 5 MG PO TABS
5.0000 mg | ORAL_TABLET | Freq: Every evening | ORAL | Status: DC | PRN
  Administered 2016-12-28 – 2016-12-29 (×2): 5 mg via ORAL
  Filled 2016-12-28 (×2): qty 1

## 2016-12-28 MED ORDER — ENOXAPARIN SODIUM 40 MG/0.4ML ~~LOC~~ SOLN: 40.0000 mg | SUBCUTANEOUS | Status: DC

## 2016-12-28 MED ORDER — TIZANIDINE HCL 2 MG PO TABS
2.0000 mg | ORAL_TABLET | Freq: Four times a day (QID) | ORAL | Status: DC | PRN
  Filled 2016-12-28: qty 1

## 2016-12-28 MED ORDER — ROSUVASTATIN CALCIUM 20 MG PO TABS
40.0000 mg | ORAL_TABLET | Freq: Every day | ORAL | Status: DC
  Administered 2016-12-28 – 2016-12-29 (×2): 40 mg via ORAL
  Filled 2016-12-28: qty 4
  Filled 2016-12-28: qty 1
  Filled 2016-12-28 (×2): qty 4
  Filled 2016-12-28: qty 1

## 2016-12-28 MED ORDER — ASPIRIN EC 325 MG PO TBEC
325.0000 mg | DELAYED_RELEASE_TABLET | Freq: Every day | ORAL | Status: DC
  Administered 2016-12-29 – 2016-12-30 (×2): 325 mg via ORAL
  Filled 2016-12-28 (×2): qty 1

## 2016-12-28 MED ORDER — ASPIRIN 81 MG PO CHEW
324.0000 mg | CHEWABLE_TABLET | Freq: Once | ORAL | Status: AC
  Administered 2016-12-28: 324 mg via ORAL
  Filled 2016-12-28: qty 4

## 2016-12-28 MED ORDER — OXYCODONE-ACETAMINOPHEN 5-325 MG PO TABS: 1.0000 | ORAL_TABLET | ORAL | Status: DC | PRN

## 2016-12-28 MED ORDER — MORPHINE SULFATE (PF) 4 MG/ML IV SOLN: 2.0000 mg | INTRAVENOUS | Status: DC | PRN

## 2016-12-28 MED ORDER — ONDANSETRON HCL 4 MG/2ML IJ SOLN: 4.0000 mg | Freq: Four times a day (QID) | INTRAMUSCULAR | Status: DC | PRN

## 2016-12-28 MED ORDER — HEPARIN (PORCINE) IN NACL 100-0.45 UNIT/ML-% IJ SOLN
1200.0000 [IU]/h | INTRAMUSCULAR | Status: DC
  Administered 2016-12-28 – 2016-12-29 (×2): 1200 [IU]/h via INTRAVENOUS
  Filled 2016-12-28 (×2): qty 250

## 2016-12-28 MED ORDER — GI COCKTAIL ~~LOC~~: 30.0000 mL | Freq: Four times a day (QID) | ORAL | Status: DC | PRN

## 2016-12-28 NOTE — ED Notes (Signed)
ED Provider at bedside. 

## 2016-12-28 NOTE — ED Provider Notes (Signed)
[facility] DEPT Provider Note   CSN: [id number] Arrival date & time: 12/28/16  1000     History   Chief Complaint Chief Complaint  Patient presents with  . Chest Pain    HPI Jacob Parker is a 66 y.o. male.  The history is provided by the patient.  Chest Pain   This is a new problem. The current episode started 1 to 2 hours ago. The problem occurs constantly. The problem has been resolved. The pain is associated with exertion and rest. The pain is present in the substernal region. The pain is moderate. The quality of the pain is described as pressure-like. The pain radiates to the left arm. Pertinent negatives include no cough, no diaphoresis, no fever, no lower extremity edema, no nausea and no shortness of breath.  His past medical history is significant for CAD (s/p stenting x5 in 2011), hyperlipidemia and hypertension.  Pertinent negatives for past medical history include no COPD, no diabetes and no PE.   Pt reports recent travel to [place] in Nov. Denies lower extremity pain or swelling. No prior DVT/PE. No HRT or known Cancer.  Past Medical History:  Diagnosis Date  . Coronary artery disease 2011   has 2 stents in [place]; as if 10/2014 followed by Dr. Wynonia Lawman in [place]   . DJD (degenerative joint disease)   . GERD (gastroesophageal reflux disease)   . History of skin cancer 1981 / 2015  . Hyperlipidemia   . Hypertension   . Sleep apnea    uses c pap  . Stricture esophagus     Patient Active Problem List   Diagnosis Date Noted  . Chest pain 12/28/2016  . Hypertension 12/28/2016  . GERD (gastroesophageal reflux disease) 12/28/2016  . Stricture esophagus   . Hyperlipidemia   . Primary osteoarthritis of right knee 01/23/2015  . Status post total left knee replacement 01/23/2015  . Primary osteoarthritis of left knee 11/16/2014  . Coronary artery disease 12/23/2009    Past Surgical History:  Procedure Laterality Date  . APPENDECTOMY    . COLONOSCOPY    .  CORONARY ANGIOPLASTY WITH STENT PLACEMENT  2011    x 2 stents (Xience RCA and DIAG stent 2011, [place])  . FRACTURE SURGERY  1985   l foot fx / l leg  . HERNIA REPAIR  1973  . TOTAL KNEE ARTHROPLASTY Left 11/16/2014   Procedure: LEFT TOTAL KNEE ARTHROPLASTY;  Surgeon: Alta Corning, MD;  Location: [facility];  Service: Orthopedics;  Laterality: Left;  . TOTAL KNEE ARTHROPLASTY Right 01/23/2015   Procedure: RIGHT TOTAL KNEE ARTHROPLASTY;  Surgeon: Alta Corning, MD;  Location: [facility];  Service: Orthopedics;  Laterality: Right;  . UPPER GI ENDOSCOPY    . VASECTOMY         Home Medications    Prior to Admission medications   Medication Sig Start Date End Date Taking? Authorizing Provider  aspirin EC 81 MG tablet Take 81 mg by mouth daily.   Yes Historical Provider, MD  esomeprazole (NEXIUM) 20 MG capsule Take 20 mg by mouth daily before breakfast.   Yes Historical Provider, MD  HYDROcodone-acetaminophen (NORCO/VICODIN) 5-325 MG tablet Take 1 tablet by mouth every 6 (six) hours as needed for moderate pain.   Yes Historical Provider, MD  lisinopril (PRINIVIL,ZESTRIL) 20 MG tablet Take 20 mg by mouth at bedtime.   Yes Historical Provider, MD  Multiple Vitamin (MULTIVITAMIN WITH MINERALS) TABS tablet Take 1 tablet by mouth daily.   Yes Historical  Provider, MD  nebivolol (BYSTOLIC) 5 MG tablet Take 5 mg by mouth daily.    Yes Historical Provider, MD  Omega-3 Fatty Acids (FISH OIL) 1000 MG CAPS Take 1,000 mg by mouth daily.   Yes Historical Provider, MD  rosuvastatin (CRESTOR) 40 MG tablet Take 40 mg by mouth at bedtime.    Yes Historical Provider, MD  zolpidem (AMBIEN) 10 MG tablet Take 10 mg by mouth at bedtime as needed for sleep. 10/22/16  Yes Historical Provider, MD  aspirin EC 325 MG tablet Take 1 tablet (325 mg total) by mouth 2 (two) times daily after a meal. Take x 1 month post op to decrease risk of blood clots. Patient not taking: Reported on 12/28/2016 01/23/15   Gary Fleet, PA-C  OxyCODONE  (OXYCONTIN) 20 mg T12A 12 hr tablet Take 1 tablet (20 mg total) by mouth every 12 (twelve) hours. Patient not taking: Reported on 12/28/2016 01/25/15   Gary Fleet, PA-C  oxyCODONE-acetaminophen (PERCOCET/ROXICET) 5-325 MG per tablet Take 1-2 tablets by mouth every 4 (four) hours as needed for severe pain (pain). Patient not taking: Reported on 12/28/2016 01/23/15   Gary Fleet, PA-C  tiZANidine (ZANAFLEX) 2 MG tablet Take 1 tablet (2 mg total) by mouth every 6 (six) hours as needed for muscle spasms. Patient not taking: Reported on 12/28/2016 01/23/15   Gary Fleet, PA-C    Family History Family History  Problem Relation Age of Onset  . CAD Mother   . Alzheimer's disease Mother     Social History Social History  Substance Use Topics  . Smoking status: Never Smoker  . Smokeless tobacco: Never Used  . Alcohol use 1.8 oz/week    3 Standard drinks or equivalent per week     Comment: occasional     Allergies   Patient has no known allergies.   Review of Systems Review of Systems  Constitutional: Negative for diaphoresis and fever.  Respiratory: Negative for cough and shortness of breath.   Cardiovascular: Positive for chest pain.  Gastrointestinal: Negative for nausea.  Ten systems are reviewed and are negative for acute change except as noted in the HPI    Physical Exam Updated Vital Signs BP 139/76   Pulse 71   Temp 97.9 F (36.6 C) (Oral)   Resp 14   Ht 6\' 3"  (1.905 m)   Wt 210 lb (95.3 kg)   SpO2 94%   BMI 26.25 kg/m   Physical Exam  Constitutional: He is oriented to person, place, and time. He appears well-developed and well-nourished. No distress.  HENT:  Head: Normocephalic and atraumatic.  Nose: Nose normal.  Eyes: Conjunctivae and EOM are normal. Pupils are equal, round, and reactive to light. Right eye exhibits no discharge. Left eye exhibits no discharge. No scleral icterus.  Neck: Normal range of motion. Neck supple.  Cardiovascular: Normal rate and  regular rhythm.  Exam reveals no gallop and no friction rub.   No murmur heard. Pulmonary/Chest: Effort normal and breath sounds normal. No stridor. No respiratory distress. He has no rales.  Abdominal: Soft. He exhibits no distension. There is no tenderness.  Musculoskeletal: He exhibits no edema or tenderness.  No lower extremity swelling  Neurological: He is alert and oriented to person, place, and time.  Skin: Skin is warm and dry. No rash noted. He is not diaphoretic. No erythema.  Psychiatric: He has a normal mood and affect.  Vitals reviewed.    ED Treatments / Results  Labs (all labs ordered are listed,  but only abnormal results are displayed) Labs Reviewed  D-DIMER, QUANTITATIVE (NOT AT [facility]) - Abnormal; Notable for the following:       Result Value   D-Dimer, Quant 1.23 (*)    All other components within normal limits  CBC  BASIC METABOLIC PANEL  LIPID PANEL  TROPONIN I  TROPONIN I  HEMOGLOBIN A1C  HEPARIN LEVEL (UNFRACTIONATED)  HEPARIN LEVEL (UNFRACTIONATED)  CBC  I-STAT TROPOININ, ED    EKG  EKG Interpretation  Date/Time:  Saturday December 28 2016 11:59:03 EST Ventricular Rate:  70 PR Interval:    QRS Duration: 91 QT Interval:  408 QTC Calculation: 441 R Axis:   86 Text Interpretation:  Sinus rhythm Consider right ventricular hypertrophy No old tracing to compare Confirmed by [facility] MD, [place] [phone]) on 12/28/2016 12:14:58 PM       Radiology Dg Chest 2 View  Result Date: 12/28/2016 CLINICAL DATA:  Chest pain EXAM: CHEST  2 VIEW COMPARISON:  11/08/2014 FINDINGS: Normal heart size. Stable mediastinal contours including mild prominence of the main pulmonary arterial contour. Possibility of mild pleural thickening in the lateral left mid chest, stable. No calcified pleural plaques noted. There is no edema, consolidation, effusion, or pneumothorax. Status post coronary stenting. IMPRESSION: Stable from prior.  No evidence of acute disease. Electronically  Signed   By: Monte Fantasia M.D.   On: 12/28/2016 11:00   Ct Angio Chest Pe W Or Wo Contrast  Result Date: 12/28/2016 CLINICAL DATA:  66 year old male with chest pressure radiating down the left arm EXAM: CT ANGIOGRAPHY CHEST WITH CONTRAST TECHNIQUE: Multidetector CT imaging of the chest was performed using the standard protocol during bolus administration of intravenous contrast. Multiplanar CT image reconstructions and MIPs were obtained to evaluate the vascular anatomy. CONTRAST:  80 mL Isovue 370 COMPARISON:  Chest x-ray obtained earlier today FINDINGS: Cardiovascular: Adequate opacification of pulmonary arteries to the proximal subsegmental level. No evidence of acute pulmonary embolus. Four vessel arch anatomy. The left vertebral artery arises directly from the aorta. No evidence of aneurysm or dissection. Calcifications are present throughout the coronary arteries. The heart is normal in size. No pericardial effusion. Mediastinum/Nodes: Unremarkable CT appearance of the thyroid gland. No suspicious mediastinal or hilar adenopathy. No soft tissue mediastinal mass. The thoracic esophagus is unremarkable. Lungs/Pleura: Trace dependent atelectasis in the inferior aspect of the lingula and left lower lobe. Otherwise, the lungs are clear. Upper Abdomen: Surgical clips present on either side of the caudate lobe of the liver. Gallstones present within the gallbladder. No evidence gallbladder distention or wall thickening. The remainder of the upper abdomen is unremarkable in appearance. Musculoskeletal: No acute fracture or aggressive appearing lytic or blastic osseous lesion. Review of the MIP images confirms the above findings. IMPRESSION: 1. Negative for acute pulmonary embolus, pneumonia or other acute cardiopulmonary process. 2. Cholelithiasis. 3. Extensive coronary artery calcifications. Electronically Signed   By: Jacqulynn Cadet M.D.   On: 12/28/2016 13:26    Procedures Procedures (including  critical care time)  Medications Ordered in ED Medications  lisinopril (PRINIVIL,ZESTRIL) tablet 20 mg (not administered)  nebivolol (BYSTOLIC) tablet 5 mg (0 mg Oral Hold 12/28/16 1529)  rosuvastatin (CRESTOR) tablet 40 mg (not administered)  pantoprazole (PROTONIX) EC tablet 40 mg (not administered)  acetaminophen (TYLENOL) tablet 650 mg (not administered)  ondansetron (ZOFRAN) injection 4 mg (not administered)  morphine 4 MG/ML injection 2 mg (not administered)  gi cocktail (Maalox,Lidocaine,Donnatal) (not administered)  aspirin EC tablet 325 mg (not administered)  gi cocktail (Maalox,Lidocaine,Donnatal) (  not administered)  heparin bolus via infusion 4,000 Units (not administered)  heparin ADULT infusion 100 units/mL (25000 units/220mL sodium chloride 0.45%) (not administered)  aspirin chewable tablet 324 mg (324 mg Oral Given 12/28/16 1231)  iopamidol (ISOVUE-370) 76 % injection (80 mLs  Contrast Given 12/28/16 1307)     Initial Impression / Assessment and Plan / ED Course  I have reviewed the triage vital signs and the nursing notes.  Pertinent labs & imaging results that were available during my care of the patient were reviewed by me and considered in my medical decision making (see chart for details).  Clinical Course as of Dec 29 1703  Sat Dec 28, 2016  1110 Exertional chest pain with radiation to the left arm in a high risk patient with a history of CAD status post stenting. EKG without acute ischemic changes or evidence of pericarditis. Initial troponin negative. Given the patient's risk factors and past medical history he will require admission for ACS rule out further workup is negative.  Unable to [facility] due to age and recent travel. Will obtain dimer.   [PC]  1130 Chest x-ray without evidence suggestive of pneumonia, pneumothorax, pneumomediastinum.  No abnormal contour of the mediastinum to suggest dissection. No evidence of acute injuries.  Presentation a classic for aortic  dissection or esophageal perforation  [PC]  1218 Dimer +. Will obtain CTA study to assess for PE.  [PC]  1300 CTA without evidence of pulmonary embolism, pneumonia, pleural effusions, pulmonary edema.  Admitted to Hospitalist for ACS rule out.  [PC]    Clinical Course User Index [PC] Fatima Blank, MD      Final Clinical Impressions(s) / ED Diagnoses   Final diagnoses:  Chest pain, unspecified type      Fatima Blank, MD 12/28/16 1705

## 2016-12-28 NOTE — Progress Notes (Signed)
Pt has arrived to 2w from the [facility] [place] [facility] ED. Telemetry box has been applied and CCMD has been notified. Pt has been oriented to the room. Pt denies pain. Vitals are stable. Will continue current plan of care.   Grant Fontana BSN, RN

## 2016-12-28 NOTE — Progress Notes (Signed)
CRITICAL VALUE ALERT  Critical value received:  Troponin  Date of notification:  12/28/2016  Time of notification:  [id number]  Critical value read back: yes  Nurse who received alert:  Grant Fontana BSN, RN  MD notified (1st page):  Hobbs  Time of first page:  68  MD notified (2nd page): Hobbs   Time of second page: 1835  Responding MD:  MD has not responded.  Time MD responded:

## 2016-12-28 NOTE — ED Triage Notes (Signed)
Pt presents with onset of L sided chest pain while seated today at 1100.  Pt reports pain radiated into L arm, denies shortness of breath, nausea or diaphoresis.  H/o stents ion 2011.

## 2016-12-28 NOTE — Progress Notes (Signed)
ANTICOAGULATION CONSULT NOTE - Follow Up Consult  Pharmacy Consult for Heparin  Indication: chest pain/ACS  No Known Allergies  Patient Measurements: Height: 6\' 3"  (190.5 cm) Weight: 211 lb 3.2 oz (95.8 kg) IBW/kg (Calculated) : 84.5  Vital Signs: Temp: 97.6 F (36.4 C) (01/06 2118) Temp Source: Oral (01/06 2118) BP: 136/74 (01/06 2118) Pulse Rate: 65 (01/06 2118)  Labs:  Recent Labs  12/28/16 1107 12/28/16 1630 12/28/16 1956 12/28/16 2248  HGB 14.9  --   --   --   HCT 42.2  --   --   --   PLT 194  --   --   --   HEPARINUNFRC  --   --   --  0.54  CREATININE 1.15  --   --   --   TROPONINI  --  0.06* 0.18*  --     Estimated Creatinine Clearance: 74.5 mL/min (by C-G formula based on SCr of 1.15 mg/dL).  Assessment: 66 y/o M on heparin for CP, troponin mildly elevated, initial heparin level is therapeutic  Goal of Therapy:  Heparin level 0.3-0.7 units/ml Monitor platelets by anticoagulation protocol: Yes   Plan:  -Cont heparin 1200 units/hr -Confirmatory HL with AM labs  Narda Bonds 12/28/2016,11:44 PM

## 2016-12-28 NOTE — Progress Notes (Signed)
ANTICOAGULATION CONSULT NOTE - Initial Consult  Pharmacy Consult for heparin Indication: chest pain/ACS  No Known Allergies  Patient Measurements: Height: 6\' 3"  (190.5 cm) Weight: 211 lb 3.2 oz (95.8 kg) IBW/kg (Calculated) : 84.5 Heparin Dosing Weight: 95kg  Vital Signs: Temp: 97.8 F (36.6 C) (01/06 1624) Temp Source: Oral (01/06 1624) BP: 162/82 (01/06 1624) Pulse Rate: 66 (01/06 1624)  Labs:  Recent Labs  12/28/16 1107  HGB 14.9  HCT 42.2  PLT 194  CREATININE 1.15    Estimated Creatinine Clearance: 74.5 mL/min (by C-G formula based on SCr of 1.15 mg/dL).   Medical History: Past Medical History:  Diagnosis Date  . Coronary artery disease 2011   has 2 stents in [place]; as if 10/2014 followed by Dr. Wynonia Parker in [place]   . DJD (degenerative joint disease)   . GERD (gastroesophageal reflux disease)   . History of skin cancer 1981 / 2015  . Hyperlipidemia   . Hypertension   . Sleep apnea    uses c pap  . Stricture esophagus     Medications:  Infusions:  . heparin      Assessment: 71 yom presented to the ED with CP. To start IV heparin. Baseline CBC is WNL and he is not on anticoagulation PTA. D-dimer is elevated but CT is negative for PE.   Goal of Therapy:  Heparin level 0.3-0.7 units/ml Monitor platelets by anticoagulation protocol: Yes   Plan:  Heparin bolus 4000 units IV x 1 Heparin gtt 1200 units/hr Check a 6 hr heparin level Daily heparin level and CBC  Jacob Parker, Jacob Parker 12/28/2016,4:58 PM

## 2016-12-28 NOTE — ED Notes (Signed)
Patient transported to CT 

## 2016-12-28 NOTE — Consult Note (Signed)
Cardiology Consultation Note  Patient ID: Jacob Parker, MRN: [id number], DOB/AGE: [birth year] 66 y.o. Admit date: 12/28/2016   Date of Consult: 12/28/2016 Primary Physician: Gerrit Heck, MD Primary Cardiologist: Dr. Wynonia Lawman, MD Requesting Physician: Dr. Renard Hamper, MD  Chief Complaint: Chest pain Reason for Consult: Same  HPI: 66 y.o. male with h/o CAD with reported PCI/stenting x 5 (one artery x 4 and another artery x 1 per patient), most recently in [place] followed by Dr. Wynonia Lawman. Also with history of HTN, HLD, GERD with esophageal stricture who presented to [facility] ED on 1/6 with chest pressure radiating to his left arm.  He denies any recent ischemic evaluation since his PCI in 2011. He has been in his usual state of health, traveling to [place] for [place] work recently. He was watching TV in bed this morning when he developed left-sided chest pressure that radiated to his left arm. Pain last 20 minutes and self resolved. Pain did not feel similar to pain at time of his prior PCI. He describes that pain as sharp and woke him from sleep and did not resolve. No associated SOB, nausea, vomiting, diaphoresis, dizziness, presyncope, or syncope. Stated he would have thought this was his reflux except for the left arm radiation.   Patient usually works out in [employer] regularly without issues, most recently working out 2 weeks prior without symptoms. He has missed the past couple of weeks 2/2 the holidays.   Upon the patient's arrival he was noted to have a negative troponin x 1 and an elevated d-dimer of 1.23. CTA chest was negative for PE with extensive coronary artery calcifications noted. He did have left arm pain x 2 while ambulating to the restroom in the ED that last a couple of minutes and self resolved. No further chest pain. EKG non-acute as below. CBC and bmet unremarkable. Currently without chest pain.     Past Medical History:  Diagnosis Date  . Coronary artery disease 2011   has 2  stents in [place]; as if 10/2014 followed by Dr. Wynonia Lawman in [place]   . DJD (degenerative joint disease)   . GERD (gastroesophageal reflux disease)   . History of skin cancer 1981 / 2015  . Hyperlipidemia   . Hypertension   . Sleep apnea    uses c pap  . Stricture esophagus       Most Recent Cardiac Studies: Not on file in Epic   Surgical History:  Past Surgical History:  Procedure Laterality Date  . APPENDECTOMY    . COLONOSCOPY    . CORONARY ANGIOPLASTY WITH STENT PLACEMENT  2011    x 2 stents (Xience RCA and DIAG stent 2011, [place])  . FRACTURE SURGERY  1985   l foot fx / l leg  . HERNIA REPAIR  1973  . TOTAL KNEE ARTHROPLASTY Left 11/16/2014   Procedure: LEFT TOTAL KNEE ARTHROPLASTY;  Surgeon: Alta Corning, MD;  Location: [facility];  Service: Orthopedics;  Laterality: Left;  . TOTAL KNEE ARTHROPLASTY Right 01/23/2015   Procedure: RIGHT TOTAL KNEE ARTHROPLASTY;  Surgeon: Alta Corning, MD;  Location: [place];  Service: Orthopedics;  Laterality: Right;  . UPPER GI ENDOSCOPY    . VASECTOMY       Home Meds: Prior to Admission medications   Medication Sig Start Date End Date Taking? Authorizing Provider  aspirin EC 81 MG tablet Take 81 mg by mouth daily.   Yes Historical Provider, MD  esomeprazole (NEXIUM) 20 MG capsule Take 20 mg by  mouth daily before breakfast.   Yes Historical Provider, MD  HYDROcodone-acetaminophen (NORCO/VICODIN) 5-325 MG tablet Take 1 tablet by mouth every 6 (six) hours as needed for moderate pain.   Yes Historical Provider, MD  lisinopril (PRINIVIL,ZESTRIL) 20 MG tablet Take 20 mg by mouth at bedtime.   Yes Historical Provider, MD  Multiple Vitamin (MULTIVITAMIN WITH MINERALS) TABS tablet Take 1 tablet by mouth daily.   Yes Historical Provider, MD  nebivolol (BYSTOLIC) 5 MG tablet Take 5 mg by mouth daily.    Yes Historical Provider, MD  Omega-3 Fatty Acids (FISH OIL) 1000 MG CAPS Take 1,000 mg by mouth daily.   Yes Historical Provider, MD  rosuvastatin  (CRESTOR) 40 MG tablet Take 40 mg by mouth at bedtime.    Yes Historical Provider, MD  zolpidem (AMBIEN) 10 MG tablet Take 10 mg by mouth at bedtime as needed for sleep. 10/22/16  Yes Historical Provider, MD  aspirin EC 325 MG tablet Take 1 tablet (325 mg total) by mouth 2 (two) times daily after a meal. Take x 1 month post op to decrease risk of blood clots. Patient not taking: Reported on 12/28/2016 01/23/15   Gary Fleet, PA-C  OxyCODONE (OXYCONTIN) 20 mg T12A 12 hr tablet Take 1 tablet (20 mg total) by mouth every 12 (twelve) hours. Patient not taking: Reported on 12/28/2016 01/25/15   Gary Fleet, PA-C  oxyCODONE-acetaminophen (PERCOCET/ROXICET) 5-325 MG per tablet Take 1-2 tablets by mouth every 4 (four) hours as needed for severe pain (pain). Patient not taking: Reported on 12/28/2016 01/23/15   Gary Fleet, PA-C  tiZANidine (ZANAFLEX) 2 MG tablet Take 1 tablet (2 mg total) by mouth every 6 (six) hours as needed for muscle spasms. Patient not taking: Reported on 12/28/2016 01/23/15   Gary Fleet, PA-C    Inpatient Medications:  . [START ON 12/29/2016] aspirin EC  325 mg Oral Daily  . enoxaparin (LOVENOX) injection  40 mg Subcutaneous Q24H  . gi cocktail  30 mL Oral Once  . lisinopril  20 mg Oral QHS  . nebivolol  5 mg Oral Daily  . pantoprazole  40 mg Oral Daily  . rosuvastatin  40 mg Oral QHS     Allergies: No Known Allergies  Social History   Social History  . Marital status: Married    Spouse name: N/A  . Number of children: N/A  . Years of education: N/A   Occupational History  . Not on file.   Social History Main Topics  . Smoking status: Never Smoker  . Smokeless tobacco: Never Used  . Alcohol use 1.8 oz/week    3 Standard drinks or equivalent per week     Comment: occasional  . Drug use: No  . Sexual activity: Yes   Other Topics Concern  . Not on file   Social History Narrative  . No narrative on file     Family History  Problem Relation Age of Onset  . CAD  Mother   . Alzheimer's disease Mother      Review of Systems: Review of Systems  Constitutional: Negative for chills, diaphoresis, fever, malaise/fatigue and weight loss.  HENT: Negative for congestion.   Eyes: Negative for discharge and redness.  Respiratory: Negative for cough, hemoptysis, sputum production, shortness of breath and wheezing.   Cardiovascular: Positive for chest pain. Negative for palpitations, orthopnea, claudication, leg swelling and PND.       Chest pressure radiating to left arm  Gastrointestinal: Negative for abdominal pain, blood in stool,  heartburn, melena, nausea and vomiting.  Genitourinary: Negative for hematuria.  Musculoskeletal: Negative for falls and myalgias.  Skin: Negative for rash.  Neurological: Negative for dizziness, tingling, tremors, sensory change, speech change, focal weakness, loss of consciousness and weakness.  Endo/Heme/Allergies: Does not bruise/bleed easily.  Psychiatric/Behavioral: Negative for substance abuse. The patient is not nervous/anxious.   All other systems reviewed and are negative.   Labs: No results for input(s): CKTOTAL, CKMB, TROPONINI in the last 72 hours. Lab Results  Component Value Date   WBC 5.8 12/28/2016   HGB 14.9 12/28/2016   HCT 42.2 12/28/2016   MCV 88.3 12/28/2016   PLT 194 12/28/2016    Recent Labs Lab 12/28/16 1107  NA 137  K 4.1  CL 104  CO2 22  BUN 15  CREATININE 1.15  CALCIUM 9.3  GLUCOSE 97   No results found for: CHOL, HDL, LDLCALC, TRIG Lab Results  Component Value Date   DDIMER 1.23 (H) 12/28/2016    Radiology/Studies:  Dg Chest 2 View  Result Date: 12/28/2016 CLINICAL DATA:  Chest pain EXAM: CHEST  2 VIEW COMPARISON:  11/08/2014 FINDINGS: Normal heart size. Stable mediastinal contours including mild prominence of the main pulmonary arterial contour. Possibility of mild pleural thickening in the lateral left mid chest, stable. No calcified pleural plaques noted. There is no edema,  consolidation, effusion, or pneumothorax. Status post coronary stenting. IMPRESSION: Stable from prior.  No evidence of acute disease. Electronically Signed   By: Monte Fantasia M.D.   On: 12/28/2016 11:00   Ct Angio Chest Pe W Or Wo Contrast  Result Date: 12/28/2016 CLINICAL DATA:  66 year old male with chest pressure radiating down the left arm EXAM: CT ANGIOGRAPHY CHEST WITH CONTRAST TECHNIQUE: Multidetector CT imaging of the chest was performed using the standard protocol during bolus administration of intravenous contrast. Multiplanar CT image reconstructions and MIPs were obtained to evaluate the vascular anatomy. CONTRAST:  80 mL Isovue 370 COMPARISON:  Chest x-ray obtained earlier today FINDINGS: Cardiovascular: Adequate opacification of pulmonary arteries to the proximal subsegmental level. No evidence of acute pulmonary embolus. Four vessel arch anatomy. The left vertebral artery arises directly from the aorta. No evidence of aneurysm or dissection. Calcifications are present throughout the coronary arteries. The heart is normal in size. No pericardial effusion. Mediastinum/Nodes: Unremarkable CT appearance of the thyroid gland. No suspicious mediastinal or hilar adenopathy. No soft tissue mediastinal mass. The thoracic esophagus is unremarkable. Lungs/Pleura: Trace dependent atelectasis in the inferior aspect of the lingula and left lower lobe. Otherwise, the lungs are clear. Upper Abdomen: Surgical clips present on either side of the caudate lobe of the liver. Gallstones present within the gallbladder. No evidence gallbladder distention or wall thickening. The remainder of the upper abdomen is unremarkable in appearance. Musculoskeletal: No acute fracture or aggressive appearing lytic or blastic osseous lesion. Review of the MIP images confirms the above findings. IMPRESSION: 1. Negative for acute pulmonary embolus, pneumonia or other acute cardiopulmonary process. 2. Cholelithiasis. 3. Extensive  coronary artery calcifications. Electronically Signed   By: Jacqulynn Cadet M.D.   On: 12/28/2016 13:26    EKG: Interpreted by me showed: 10:05 NSR, 70 bpm, right axis deviation, nonspecific inferior st elevation without reciprocal changes Telemetry: Interpreted by me showed: NSR, 70's bpm in room  Weights: Filed Weights   12/28/16 1019  Weight: 210 lb (95.3 kg)     Physical Exam: Blood pressure 137/89, pulse 70, temperature 97.9 F (36.6 C), temperature source Oral, resp. rate 16, height 6'  3" (1.905 m), weight 210 lb (95.3 kg), SpO2 95 %. Body mass index is 26.25 kg/m. General: Well developed, well nourished, in no acute distress. Head: Normocephalic, atraumatic, sclera non-icteric, no xanthomas, nares are without discharge.  Neck: Negative for carotid bruits. JVD not elevated. Lungs: Clear bilaterally to auscultation without wheezes, rales, or rhonchi. Breathing is unlabored. Heart: RRR with S1 S2. No murmurs, rubs, or gallops appreciated. Abdomen: Soft, non-tender, non-distended with normoactive bowel sounds. No hepatomegaly. No rebound/guarding. No obvious abdominal masses. Msk:  Strength and tone appear normal for age. Extremities: No clubbing or cyanosis. No edema. Distal pedal pulses are 2+ and equal bilaterally. Neuro: Alert and oriented X 3. No facial asymmetry. No focal deficit. Moves all extremities spontaneously. Psych:  Responds to questions appropriately with a normal affect.    Assessment and Plan:  Principal Problem:   Chest pain Active Problems:   Status post total left knee replacement   Hypertension   Hyperlipidemia   Coronary artery disease   GERD (gastroesophageal reflux disease)    1. Chest pain with moderate risk of cardiac etiology: -Continue to cycle troponin levels to rule out -Check echo to evaluate LV systolic function and wall motion -Consider inpatient vs outpatient treadmill Myoview based on patient's labs and overnight, will make NPO at  midnight -Received CTA chest today in the ED that was negative for PE though did show extensive coronary calcifications  -Given CTA chest would need to defer cardiac cath 24-48 hours to limit contrast-induced nephropathy  -Check lipid and A1C for further risk stratification  -Currently chest pain free -Consider adding Imdur prior to discharge   2. CAD s/p reported remote PCI as above: -Plan to rule out and evaluate for ischemia as above -ASA -Bystolic -Lisinopril   3. HTN: -BP elevated this PM -Has Bystolic to take at this time -Monitor   4. HLD: -Crestor  5. GERD/esophageal stricture: -Cannot rule out playing a role in the above at this time -Continue home medications -GI cocktail    Signed, Christell Faith, PA-C [place] Pager: [phone] 12/28/2016, 4:16 PM As above, patient seen and examined. Briefly he is a 66 year old male with past medical history of coronary artery disease with prior PCI in [place] in 2011, hypertension, hyperlipidemia, gastroesophageal reflux disease with unstable angina. Patient typically does not have dyspnea on exertion, orthopnea, PND, pedal edema, exertional chest pain or syncope. This morning after breakfast he developed pressure in his chest radiating to his left upper extremity. There was mild dyspnea but no nausea or diaphoresis. The pain was not pleuritic or positional. It lasted 30 minutes and resolved. Symptoms similar to those with infarct in 2011. Presently pain-free. Cardiology asked to evaluate. Electrocardiogram shows sinus rhythm, RV conduction delay and right axis deviation. Chest CT shows no pulmonary embolus. Initial troponin normal.  1 unstable angina-patient's symptoms are extremely concerning. We will continue to cycle enzymes. Treat with aspirin and continue beta blocker and statin. Add IV heparin. Plan cardiac catheterization on Monday. The risks and benefits including myocardial infarction, CVA and death discussed and he agrees  to proceed.  2 hypertension-continue preadmission medications and adjust regimen based on follow-up readings.  3 hyperlipidemia-continue statin.  Kirk Ruths, MD

## 2016-12-28 NOTE — H&P (Signed)
History and Physical    Jacob Parker [id number] DOB: Jun 17, 1951 DOA: 12/28/2016  PCP: Gerrit Heck, MD Patient coming from: home  Chief Complaint: chest pain  HPI: Jacob Parker is a very pleasant 66 y.o. male with medical history significant  of CAD status post stents 2011, hyperlipidemia, hypertension presents to emergency Department chief complaint chest pain at rest. He being admitted to rule out.  Information is obtained from the patient he states he was watching TV quite calm developed sudden onset left anterior chest pain. He describes it as a "pressure" that radiated to his left shoulder and down his left arm. He denies shortness of breath diaphoresis nausea vomiting lower extremity edema. He denies headache dizziness syncope or near-syncope. He denies cough abdominal pain fever chills or sick contacts. 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 He denies any lower extremity pain edema no history of DVTs. He denies dysuria hematuria frequency or urgency. States he took 81 mg of aspirin prior to the onset of the chest pain as part of his daily medicines.  ED Course: The emergency department is afebrile hemodynamically stable and not hypoxic  Review of Systems: As per HPI otherwise 10 point review of systems negative.   Ambulatory Status: He is ambulating independently independent with ADLs currently employed as a Personal assistant  Past Medical History:  Diagnosis Date  . Coronary artery disease 2011   has 2 stents in [place]; as if 10/2014 followed by Dr. Wynonia Lawman in [place]   . DJD (degenerative joint disease)   . GERD (gastroesophageal reflux disease)   . History of skin cancer 1981 / 2015  . Hyperlipidemia   . Hypertension   . Sleep apnea    uses c pap  . Stricture esophagus     Past Surgical History:  Procedure Laterality Date  . APPENDECTOMY    . COLONOSCOPY      . CORONARY ANGIOPLASTY WITH STENT PLACEMENT  2011    x 2 stents (Xience RCA and DIAG stent 2011, [place])  . FRACTURE SURGERY  1985   l foot fx / l leg  . HERNIA REPAIR  1973  . TOTAL KNEE ARTHROPLASTY Left 11/16/2014   Procedure: LEFT TOTAL KNEE ARTHROPLASTY;  Surgeon: Alta Corning, MD;  Location: [facility];  Service: Orthopedics;  Laterality: Left;  . TOTAL KNEE ARTHROPLASTY Right 01/23/2015   Procedure: RIGHT TOTAL KNEE ARTHROPLASTY;  Surgeon: Alta Corning, MD;  Location: [facility];  Service: Orthopedics;  Laterality: Right;  . UPPER GI ENDOSCOPY    . VASECTOMY      Social History   Social History  . Marital status: Married    Spouse name: N/A  . Number of children: N/A  . Years of education: N/A   Occupational History  . Not on file.   Social History Main Topics  . Smoking status: Never Smoker  . Smokeless tobacco: Never Used  . Alcohol use 1.8 oz/week    3 Standard drinks or equivalent per week     Comment: occasional  . Drug use: No  . Sexual activity: Yes   Other Topics Concern  . Not on file   Social History Narrative  . No narrative on file  Is at home with his wife. Independent with ADLs. Employed as a Personal assistant and travels to [place] every 2-3 months  No Known Allergies  Family History  Problem Relation  Age of Onset  . CAD Mother   . Alzheimer's disease Mother     Prior to Admission medications   Medication Sig Start Date End Date Taking? Authorizing Provider  aspirin EC 81 MG tablet Take 81 mg by mouth daily.   Yes Historical Provider, MD  esomeprazole (NEXIUM) 20 MG capsule Take 20 mg by mouth daily before breakfast.   Yes Historical Provider, MD  HYDROcodone-acetaminophen (NORCO/VICODIN) 5-325 MG tablet Take 1 tablet by mouth every 6 (six) hours as needed for moderate pain.   Yes Historical Provider, MD  lisinopril (PRINIVIL,ZESTRIL) 20 MG tablet Take 20 mg by mouth at bedtime.   Yes Historical Provider, MD  Multiple Vitamin (MULTIVITAMIN WITH MINERALS)  TABS tablet Take 1 tablet by mouth daily.   Yes Historical Provider, MD  nebivolol (BYSTOLIC) 5 MG tablet Take 5 mg by mouth daily.    Yes Historical Provider, MD  Omega-3 Fatty Acids (FISH OIL) 1000 MG CAPS Take 1,000 mg by mouth daily.   Yes Historical Provider, MD  rosuvastatin (CRESTOR) 40 MG tablet Take 40 mg by mouth at bedtime.    Yes Historical Provider, MD  zolpidem (AMBIEN) 10 MG tablet Take 10 mg by mouth at bedtime as needed for sleep. 10/22/16  Yes Historical Provider, MD  aspirin EC 325 MG tablet Take 1 tablet (325 mg total) by mouth 2 (two) times daily after a meal. Take x 1 month post op to decrease risk of blood clots. Patient not taking: Reported on 12/28/2016 01/23/15   Gary Fleet, PA-C  OxyCODONE (OXYCONTIN) 20 mg T12A 12 hr tablet Take 1 tablet (20 mg total) by mouth every 12 (twelve) hours. Patient not taking: Reported on 12/28/2016 01/25/15   Gary Fleet, PA-C  oxyCODONE-acetaminophen (PERCOCET/ROXICET) 5-325 MG per tablet Take 1-2 tablets by mouth every 4 (four) hours as needed for severe pain (pain). Patient not taking: Reported on 12/28/2016 01/23/15   Gary Fleet, PA-C  tiZANidine (ZANAFLEX) 2 MG tablet Take 1 tablet (2 mg total) by mouth every 6 (six) hours as needed for muscle spasms. Patient not taking: Reported on 12/28/2016 01/23/15   Gary Fleet, PA-C    Physical Exam: Vitals:   12/28/16 1145 12/28/16 1230 12/28/16 1245 12/28/16 1415  BP: 129/73 139/78 137/89   Pulse: 69 67 73 70  Resp: 16 10 11 16   Temp:      TempSrc:      SpO2: 90% 97% 96% 95%  Weight:      Height:         General:  Appears calm and comfortable No acute distress Eyes:  PERRL, EOMI, normal lids, iris ENT:  grossly normal hearing, lips & tongue, because membranes of his mouth are moist and pink Neck:  no LAD, masses or thyromegaly Cardiovascular:  RRR, no m/r/g. No LE edema. Pedal pulses present and palpable Respiratory:  CTA bilaterally, no w/r/r. Normal respiratory effort. Abdomen:   soft, ntnd, positive bowel sounds Skin:  no rash or induration seen on limited exam Musculoskeletal:  grossly normal tone BUE/BLE, good ROM, no bony abnormality Psychiatric:  grossly normal mood and affect, speech fluent and appropriate, AOx3 Neurologic:  CN 2-12 grossly intact, moves all extremities in coordinated fashion, sensation intact   Labs on Admission: I have personally reviewed following labs and imaging studies  CBC:  Recent Labs Lab 12/28/16 1107  WBC 5.8  HGB 14.9  HCT 42.2  MCV 88.3  PLT [id number]   Basic Metabolic Panel:  Recent Labs Lab 12/28/16 1107  NA 137  K 4.1  CL 104  CO2 22  GLUCOSE 97  BUN 15  CREATININE 1.15  CALCIUM 9.3   GFR: Estimated Creatinine Clearance: 74.5 mL/min (by C-G formula based on SCr of 1.15 mg/dL). Liver Function Tests: No results for input(s): AST, ALT, ALKPHOS, BILITOT, PROT, ALBUMIN in the last 168 hours. No results for input(s): LIPASE, AMYLASE in the last 168 hours. No results for input(s): AMMONIA in the last 168 hours. Coagulation Profile: No results for input(s): INR, PROTIME in the last 168 hours. Cardiac Enzymes: No results for input(s): CKTOTAL, CKMB, CKMBINDEX, TROPONINI in the last 168 hours. BNP (last 3 results) No results for input(s): PROBNP in the last 8760 hours. HbA1C: No results for input(s): HGBA1C in the last 72 hours. CBG: No results for input(s): GLUCAP in the last 168 hours. Lipid Profile: No results for input(s): CHOL, HDL, LDLCALC, TRIG, CHOLHDL, LDLDIRECT in the last 72 hours. Thyroid Function Tests: No results for input(s): TSH, T4TOTAL, FREET4, T3FREE, THYROIDAB in the last 72 hours. Anemia Panel: No results for input(s): VITAMINB12, FOLATE, FERRITIN, TIBC, IRON, RETICCTPCT in the last 72 hours. Urine analysis:    Component Value Date/Time   COLORURINE YELLOW 01/13/2015 [place] 01/13/2015 0854   LABSPEC 1.022 01/13/2015 0854   PHURINE 6.0 01/13/2015 0854   GLUCOSEU  NEGATIVE 01/13/2015 0854   HGBUR NEGATIVE 01/13/2015 0854   BILIRUBINUR NEGATIVE 01/13/2015 0854   BILIRUBINUR neg 07/17/2013 1853   KETONESUR NEGATIVE 01/13/2015 0854   PROTEINUR NEGATIVE 01/13/2015 0854   UROBILINOGEN 0.2 01/13/2015 0854   NITRITE NEGATIVE 01/13/2015 0854   LEUKOCYTESUR NEGATIVE 01/13/2015 0854    Creatinine Clearance: Estimated Creatinine Clearance: 74.5 mL/min (by C-G formula based on SCr of 1.15 mg/dL).  Sepsis Labs: @LABRCNTIP (procalcitonin:4,lacticidven:4) )No results found for this or any previous visit (from the past 240 hour(s)).   Radiological Exams on Admission: Dg Chest 2 View  Result Date: 12/28/2016 CLINICAL DATA:  Chest pain EXAM: CHEST  2 VIEW COMPARISON:  11/08/2014 FINDINGS: Normal heart size. Stable mediastinal contours including mild prominence of the main pulmonary arterial contour. Possibility of mild pleural thickening in the lateral left mid chest, stable. No calcified pleural plaques noted. There is no edema, consolidation, effusion, or pneumothorax. Status post coronary stenting. IMPRESSION: Stable from prior.  No evidence of acute disease. Electronically Signed   By: Monte Fantasia M.D.   On: 12/28/2016 11:00   Ct Angio Chest Pe W Or Wo Contrast  Result Date: 12/28/2016 CLINICAL DATA:  66 year old male with chest pressure radiating down the left arm EXAM: CT ANGIOGRAPHY CHEST WITH CONTRAST TECHNIQUE: Multidetector CT imaging of the chest was performed using the standard protocol during bolus administration of intravenous contrast. Multiplanar CT image reconstructions and MIPs were obtained to evaluate the vascular anatomy. CONTRAST:  80 mL Isovue 370 COMPARISON:  Chest x-ray obtained earlier today FINDINGS: Cardiovascular: Adequate opacification of pulmonary arteries to the proximal subsegmental level. No evidence of acute pulmonary embolus. Four vessel arch anatomy. The left vertebral artery arises directly from the aorta. No evidence of aneurysm  or dissection. Calcifications are present throughout the coronary arteries. The heart is normal in size. No pericardial effusion. Mediastinum/Nodes: Unremarkable CT appearance of the thyroid gland. No suspicious mediastinal or hilar adenopathy. No soft tissue mediastinal mass. The thoracic esophagus is unremarkable. Lungs/Pleura: Trace dependent atelectasis in the inferior aspect of the lingula and left lower lobe. Otherwise, the lungs are clear. Upper Abdomen: Surgical clips present on either side of the  caudate lobe of the liver. Gallstones present within the gallbladder. No evidence gallbladder distention or wall thickening. The remainder of the upper abdomen is unremarkable in appearance. Musculoskeletal: No acute fracture or aggressive appearing lytic or blastic osseous lesion. Review of the MIP images confirms the above findings. IMPRESSION: 1. Negative for acute pulmonary embolus, pneumonia or other acute cardiopulmonary process. 2. Cholelithiasis. 3. Extensive coronary artery calcifications. Electronically Signed   By: Jacqulynn Cadet M.D.   On: 12/28/2016 13:26    EKG: Independently reviewed. Sinus rhythm Consider right ventricular hypertrophy No old tracing to compare   Assessment/Plan Principal Problem:   Chest pain Active Problems:   Status post total left knee replacement   Hypertension   Hyperlipidemia   Coronary artery disease   GERD (gastroesophageal reflux disease)   #1. Chest pain. Some typical and atypical features. Heart score 5. Pain resolved on admission. Initial troponin negative. EKG with sinus rhythm no acute changes. D-dimer was elevated CT NGO negative for PE -Admit to telemetry -Cycle troponin -Serial EKG -Continue aspirin -Continue statin -Obtain an echo -GI cocktail -Cardiology consult  #2. CAD. Patient with a history of stents 5 in 2011. Currently under the care of Dr. telemetry. Home medications include low-dose aspirin, lisinopril, Bystolic, Crestor -See  #1 -may benefit from inpatient stress test -Defer to cardiology  #3. Hypertension. Fair control in the emergency department. Home medications include lisinopril and Bystolic. -Continue home meds -Monitor  #4. Hyperlipidemia. Home medications include Crestor -Obtain a lipid panel -Continue home meds  #5 GERD. Patient also with a history of esophageal stricture. Home medications include Nexium. -GI cocktail -Continue home meds   DVT prophylaxis: lovenox  Code Status: full  Family Communication: none present  Disposition Plan: home  Consults called: cardiology Dr C Admission status: obs    Radene Gunning MD [facility]  If 7PM-7AM, please contact night-coverage [email or web address] Password [email or web address]  12/28/2016, 3:28 PM

## 2016-12-29 ENCOUNTER — Observation Stay (HOSPITAL_BASED_OUTPATIENT_CLINIC_OR_DEPARTMENT_OTHER): Payer: Medicare HMO

## 2016-12-29 DIAGNOSIS — Z79899 Other long term (current) drug therapy: Secondary | ICD-10-CM | POA: Diagnosis not present

## 2016-12-29 DIAGNOSIS — I214 Non-ST elevation (NSTEMI) myocardial infarction: Secondary | ICD-10-CM

## 2016-12-29 DIAGNOSIS — Z96653 Presence of artificial knee joint, bilateral: Secondary | ICD-10-CM | POA: Diagnosis present

## 2016-12-29 DIAGNOSIS — Z9889 Other specified postprocedural states: Secondary | ICD-10-CM | POA: Diagnosis not present

## 2016-12-29 DIAGNOSIS — I10 Essential (primary) hypertension: Secondary | ICD-10-CM | POA: Diagnosis present

## 2016-12-29 DIAGNOSIS — T82858A Stenosis of vascular prosthetic devices, implants and grafts, initial encounter: Secondary | ICD-10-CM | POA: Diagnosis present

## 2016-12-29 DIAGNOSIS — Z7982 Long term (current) use of aspirin: Secondary | ICD-10-CM | POA: Diagnosis not present

## 2016-12-29 DIAGNOSIS — R079 Chest pain, unspecified: Secondary | ICD-10-CM

## 2016-12-29 DIAGNOSIS — E784 Other hyperlipidemia: Secondary | ICD-10-CM

## 2016-12-29 DIAGNOSIS — G4730 Sleep apnea, unspecified: Secondary | ICD-10-CM | POA: Diagnosis present

## 2016-12-29 DIAGNOSIS — E785 Hyperlipidemia, unspecified: Secondary | ICD-10-CM | POA: Diagnosis present

## 2016-12-29 DIAGNOSIS — Z955 Presence of coronary angioplasty implant and graft: Secondary | ICD-10-CM | POA: Diagnosis not present

## 2016-12-29 DIAGNOSIS — Z85828 Personal history of other malignant neoplasm of skin: Secondary | ICD-10-CM | POA: Diagnosis not present

## 2016-12-29 DIAGNOSIS — Y831 Surgical operation with implant of artificial internal device as the cause of abnormal reaction of the patient, or of later complication, without mention of misadventure at the time of the procedure: Secondary | ICD-10-CM | POA: Diagnosis present

## 2016-12-29 DIAGNOSIS — K219 Gastro-esophageal reflux disease without esophagitis: Secondary | ICD-10-CM | POA: Diagnosis present

## 2016-12-29 DIAGNOSIS — I2510 Atherosclerotic heart disease of native coronary artery without angina pectoris: Secondary | ICD-10-CM | POA: Diagnosis present

## 2016-12-29 HISTORY — DX: Non-ST elevation (NSTEMI) myocardial infarction: I21.4

## 2016-12-29 LAB — ECHOCARDIOGRAM COMPLETE
HEIGHTINCHES: 75 in
Weight: 3379.2 oz

## 2016-12-29 LAB — CBC
HCT: 42.7 % (ref 39.0–52.0)
HEMOGLOBIN: 14.8 g/dL (ref 13.0–17.0)
MCH: 30.8 pg (ref 26.0–34.0)
MCHC: 34.7 g/dL (ref 30.0–36.0)
MCV: 88.8 fL (ref 78.0–100.0)
Platelets: 196 10*3/uL (ref 150–400)
RBC: 4.81 MIL/uL (ref 4.22–5.81)
RDW: 12.9 % (ref 11.5–15.5)
WBC: 5.9 10*3/uL (ref 4.0–10.5)

## 2016-12-29 LAB — HEMOGLOBIN A1C
Hgb A1c MFr Bld: 5.2 % (ref 4.8–5.6)
MEAN PLASMA GLUCOSE: 103 mg/dL

## 2016-12-29 LAB — PROTIME-INR
INR: 1.06
Prothrombin Time: 13.8 seconds (ref 11.4–15.2)

## 2016-12-29 LAB — HEPARIN LEVEL (UNFRACTIONATED): HEPARIN UNFRACTIONATED: 0.5 [IU]/mL (ref 0.30–0.70)

## 2016-12-29 MED ORDER — SODIUM CHLORIDE 0.9% FLUSH: 3.0000 mL | Freq: Two times a day (BID) | INTRAVENOUS | Status: DC

## 2016-12-29 MED ORDER — SODIUM CHLORIDE 0.9 % WEIGHT BASED INFUSION
1.0000 mL/kg/h | INTRAVENOUS | Status: DC
  Administered 2016-12-30: 1 mL/kg/h via INTRAVENOUS

## 2016-12-29 MED ORDER — SODIUM CHLORIDE 0.9 % WEIGHT BASED INFUSION
3.0000 mL/kg/h | INTRAVENOUS | Status: DC
  Administered 2016-12-30: 3 mL/kg/h via INTRAVENOUS

## 2016-12-29 MED ORDER — SODIUM CHLORIDE 0.9% FLUSH: 3.0000 mL | INTRAVENOUS | Status: DC | PRN

## 2016-12-29 MED ORDER — SODIUM CHLORIDE 0.9 % IV SOLN: 250.0000 mL | INTRAVENOUS | Status: DC | PRN

## 2016-12-29 NOTE — Progress Notes (Signed)
Patient Name: Jacob Parker Date of Encounter: 12/29/2016  Primary Cardiologist: [facility] Problem List     Principal Problem:   Chest pain Active Problems:   Status post total left knee replacement   Hypertension   Hyperlipidemia   Coronary artery disease   GERD (gastroesophageal reflux disease)     Subjective   No chest pain or dyspnea  Inpatient Medications    Scheduled Meds: . aspirin EC  325 mg Oral Daily  . gi cocktail  30 mL Oral Once  . lisinopril  20 mg Oral QHS  . nebivolol  5 mg Oral Daily  . pantoprazole  40 mg Oral Daily  . rosuvastatin  40 mg Oral QHS   Continuous Infusions: . heparin 1,200 Units/hr (12/28/16 1823)   PRN Meds: acetaminophen, gi cocktail, morphine injection, ondansetron (ZOFRAN) IV, zolpidem   Vital Signs    Vitals:   12/28/16 1415 12/28/16 1624 12/28/16 2118 12/29/16 0457  BP:  (!) 162/82 136/74 (!) 141/77  Pulse: 70 66 65 73  Resp: 16  16 18   Temp:  97.8 F (36.6 C) 97.6 F (36.4 C) 97.9 F (36.6 C)  TempSrc:  Oral Oral Oral  SpO2: 95% 98% 94% 94%  Weight:  211 lb 3.2 oz (95.8 kg)    Height:  6\' 3"  (1.905 m)      Intake/Output Summary (Last 24 hours) at 12/29/16 0728 Last data filed at 12/29/16 0459  Gross per 24 hour  Intake              240 ml  Output              850 ml  Net             -610 ml   Filed Weights   12/28/16 1019 12/28/16 1624  Weight: 210 lb (95.3 kg) 211 lb 3.2 oz (95.8 kg)    Physical Exam    GEN: Well nourished, well developed, in no acute distress.  HEENT: Grossly normal.  Neck: Supple Cardiac: RRR Respiratory:  CTA GI: Soft, nontender, nondistended. MS: no deformity or atrophy. Skin: warm and dry, no rash. Neuro:  Strength and sensation are intact. Psych: AAOx3.  Normal affect.  Labs    CBC  Recent Labs  12/28/16 1107 12/29/16 0252  WBC 5.8 5.9  HGB 14.9 14.8  HCT 42.2 42.7  MCV 88.3 88.8  PLT 194 [id number]   Basic Metabolic Panel  Recent Labs   12/28/16 1107  NA 137  K 4.1  CL 104  CO2 22  GLUCOSE 97  BUN 15  CREATININE 1.15  CALCIUM 9.3   Cardiac Enzymes  Recent Labs  12/28/16 1630 12/28/16 1956  TROPONINI 0.06* 0.18*   D-Dimer  Recent Labs  12/28/16 1107  DDIMER 1.23*   Fasting Lipid Panel  Recent Labs  12/28/16 1630  CHOL 156  HDL 47  LDLCALC 79  TRIG 150*  CHOLHDL 3.3     Telemetry    Sinus- Personally Reviewed   Radiology    Dg Chest 2 View  Result Date: 12/28/2016 CLINICAL DATA:  Chest pain EXAM: CHEST  2 VIEW COMPARISON:  11/08/2014 FINDINGS: Normal heart size. Stable mediastinal contours including mild prominence of the main pulmonary arterial contour. Possibility of mild pleural thickening in the lateral left mid chest, stable. No calcified pleural plaques noted. There is no edema, consolidation, effusion, or pneumothorax. Status post coronary stenting. IMPRESSION: Stable from prior.  No evidence of acute disease.  Electronically Signed   By: Monte Fantasia M.D.   On: 12/28/2016 11:00   Ct Angio Chest Pe W Or Wo Contrast  Result Date: 12/28/2016 CLINICAL DATA:  66 year old male with chest pressure radiating down the left arm EXAM: CT ANGIOGRAPHY CHEST WITH CONTRAST TECHNIQUE: Multidetector CT imaging of the chest was performed using the standard protocol during bolus administration of intravenous contrast. Multiplanar CT image reconstructions and MIPs were obtained to evaluate the vascular anatomy. CONTRAST:  80 mL Isovue 370 COMPARISON:  Chest x-ray obtained earlier today FINDINGS: Cardiovascular: Adequate opacification of pulmonary arteries to the proximal subsegmental level. No evidence of acute pulmonary embolus. Four vessel arch anatomy. The left vertebral artery arises directly from the aorta. No evidence of aneurysm or dissection. Calcifications are present throughout the coronary arteries. The heart is normal in size. No pericardial effusion. Mediastinum/Nodes: Unremarkable CT appearance of  the thyroid gland. No suspicious mediastinal or hilar adenopathy. No soft tissue mediastinal mass. The thoracic esophagus is unremarkable. Lungs/Pleura: Trace dependent atelectasis in the inferior aspect of the lingula and left lower lobe. Otherwise, the lungs are clear. Upper Abdomen: Surgical clips present on either side of the caudate lobe of the liver. Gallstones present within the gallbladder. No evidence gallbladder distention or wall thickening. The remainder of the upper abdomen is unremarkable in appearance. Musculoskeletal: No acute fracture or aggressive appearing lytic or blastic osseous lesion. Review of the MIP images confirms the above findings. IMPRESSION: 1. Negative for acute pulmonary embolus, pneumonia or other acute cardiopulmonary process. 2. Cholelithiasis. 3. Extensive coronary artery calcifications. Electronically Signed   By: Jacqulynn Cadet M.D.   On: 12/28/2016 13:26     Patient Profile     66 yo male with PMH CAD (PCI [place]) admitted with chest pain and ruled in for NSTEMI  Assessment & Plan    1 NSTEMI-patient has ruled in with troponin 0.18. Continue with aspirin, beta blocker and statin. Continue  IV heparin. Plan cardiac catheterization on Monday. The risks and benefits including myocardial infarction, CVA and death discussed and he agrees to proceed.  2 hypertension-continue preadmission medications.  3 hyperlipidemia-continue statin.  Signed, Kirk Ruths, MD  12/29/2016, 7:28 AM

## 2016-12-29 NOTE — Progress Notes (Signed)
ANTICOAGULATION CONSULT NOTE  Pharmacy Consult for heparin Indication: chest pain/ACS  No Known Allergies  Patient Measurements: Height: 6\' 3"  (190.5 cm) Weight: 211 lb 3.2 oz (95.8 kg) IBW/kg (Calculated) : 84.5 Heparin Dosing Weight: 95kg  Vital Signs: Temp: 97.9 F (36.6 C) (01/07 0457) Temp Source: Oral (01/07 0457) BP: 141/77 (01/07 0457) Pulse Rate: 73 (01/07 0457)  Labs:  Recent Labs  12/28/16 1107 12/28/16 1630 12/28/16 1956 12/28/16 2248 12/29/16 0252  HGB 14.9  --   --   --  14.8  HCT 42.2  --   --   --  42.7  PLT 194  --   --   --  196  HEPARINUNFRC  --   --   --  0.54 0.50  CREATININE 1.15  --   --   --   --   TROPONINI  --  0.06* 0.18*  --   --     Estimated Creatinine Clearance: 74.5 mL/min (by C-G formula based on SCr of 1.15 mg/dL).   Medical History: Past Medical History:  Diagnosis Date  . Coronary artery disease 2011   has 2 stents in [place]; as if 10/2014 followed by Dr. Wynonia Lawman in [place]   . DJD (degenerative joint disease)   . GERD (gastroesophageal reflux disease)   . History of skin cancer 1981 / 2015  . Hyperlipidemia   . Hypertension   . Sleep apnea    uses c pap  . Stricture esophagus     Medications:  Infusions:  . heparin 1,200 Units/hr (12/29/16 1018)    Assessment: 50 yom presented to the ED with CP. Continuing on IV heparin for NSTEMI. Plan cath for 1/8. He is not on anticoagulation PTA. D-dimer is elevated but CT is negative for PE. CBC wnl, no bleed documented.  Heparin level remains therapeutic at 0.5 on 1200 units/h.  Goal of Therapy:  Heparin level 0.3-0.7 units/ml Monitor platelets by anticoagulation protocol: Yes   Plan:  Heparin gtt at 1200 units/hr Daily heparin level and CBC Monitor for s/sx bleeding  Elicia Lamp, PharmD, BCPS Clinical Pharmacist 12/29/2016 10:23 AM

## 2016-12-29 NOTE — Progress Notes (Addendum)
Patient ID: Jacob Parker, male   DOB: [birth year], 66 y.o.   MRN: [id number]  PROGRESS NOTE    Jacob Parker  [id number] DOB: 1951/06/09 DOA: 12/28/2016  PCP: Gerrit Heck, MD   Brief Narrative:  66 y.o. male with medical history significant for CAD status post stents 2011, hyperlipidemia, hypertension who presented to ED with chest pain that started at rest  While watching TV, pressure like, radiating to left shoulder and down to left arm. Lasted for few minutes before it subsided. No associated fevers or chills. No palpitations or lightheadedness or loss of consciousness.   Assessment & Plan:   Principal Problem:   Chest pain / NSTEMI - Pt ruled in with troponin of 0.06 and 0.18 and chest pain - Seen by cardio and plan for cardiac cath tomorrow - Continue aspirin, lisinopril and nebivolol - Follow up 2 D ECHO today - Continue heparin drip   Active Problems:   Status post total left knee replacement - Stable    Hypertension, essential - Continue lisinopril and nebivolol    Dyslipidemia - Continue statin therapy    DVT prophylaxis: SCD's bilaterally; Heparin drip  Code Status: full code  Family Communication: no family at the bedside this am Disposition Plan: cardiac cath tomorrow    Consultants:   Cardio   Procedures:  None   Antimicrobials:   None    Subjective: No overnight events.   Objective: Vitals:   12/28/16 1415 12/28/16 1624 12/28/16 2118 12/29/16 0457  BP:  (!) 162/82 136/74 (!) 141/77  Pulse: 70 66 65 73  Resp: 16  16 18   Temp:  97.8 F (36.6 C) 97.6 F (36.4 C) 97.9 F (36.6 C)  TempSrc:  Oral Oral Oral  SpO2: 95% 98% 94% 94%  Weight:  95.8 kg (211 lb 3.2 oz)    Height:  6\' 3"  (1.905 m)      Intake/Output Summary (Last 24 hours) at 12/29/16 1250 Last data filed at 12/29/16 1111  Gross per 24 hour  Intake              240 ml  Output             1100 ml  Net             -860 ml   Filed Weights   12/28/16  1019 12/28/16 1624  Weight: 95.3 kg (210 lb) 95.8 kg (211 lb 3.2 oz)    Examination:  General exam: Appears calm and comfortable  Respiratory system: Clear to auscultation. Respiratory effort normal. Cardiovascular system: S1 & S2 heard, RRR. No JVD, murmurs, rubs, gallops or clicks. No pedal edema. Gastrointestinal system: Abdomen is nondistended, soft and nontender. No organomegaly or masses felt. Normal bowel sounds heard. Central nervous system: Alert and oriented. No focal neurological deficits. Extremities: Symmetric 5 x 5 power. Skin: No rashes, lesions or ulcers Psychiatry: Judgement and insight appear normal. Mood & affect appropriate.   Data Reviewed: I have personally reviewed following labs and imaging studies  CBC:  Recent Labs Lab 12/28/16 1107 12/29/16 0252  WBC 5.8 5.9  HGB 14.9 14.8  HCT 42.2 42.7  MCV 88.3 88.8  PLT 194 [id number]   Basic Metabolic Panel:  Recent Labs Lab 12/28/16 1107  NA 137  K 4.1  CL 104  CO2 22  GLUCOSE 97  BUN 15  CREATININE 1.15  CALCIUM 9.3   GFR: Estimated Creatinine Clearance: 74.5 mL/min (by C-G formula based on SCr of 1.15 mg/dL). Liver  Function Tests: No results for input(s): AST, ALT, ALKPHOS, BILITOT, PROT, ALBUMIN in the last 168 hours. No results for input(s): LIPASE, AMYLASE in the last 168 hours. No results for input(s): AMMONIA in the last 168 hours. Coagulation Profile: No results for input(s): INR, PROTIME in the last 168 hours. Cardiac Enzymes:  Recent Labs Lab 12/28/16 1630 12/28/16 1956  TROPONINI 0.06* 0.18*   BNP (last 3 results) No results for input(s): PROBNP in the last 8760 hours. HbA1C:  Recent Labs  12/28/16 1638  HGBA1C 5.2   CBG: No results for input(s): GLUCAP in the last 168 hours. Lipid Profile:  Recent Labs  12/28/16 1630  CHOL 156  HDL 47  LDLCALC 79  TRIG 150*  CHOLHDL 3.3   Thyroid Function Tests: No results for input(s): TSH, T4TOTAL, FREET4, T3FREE, THYROIDAB in  the last 72 hours. Anemia Panel: No results for input(s): VITAMINB12, FOLATE, FERRITIN, TIBC, IRON, RETICCTPCT in the last 72 hours. Urine analysis:    Component Value Date/Time   COLORURINE YELLOW 01/13/2015 [place] 01/13/2015 0854   LABSPEC 1.022 01/13/2015 0854   PHURINE 6.0 01/13/2015 0854   GLUCOSEU NEGATIVE 01/13/2015 0854   HGBUR NEGATIVE 01/13/2015 0854   BILIRUBINUR NEGATIVE 01/13/2015 0854   BILIRUBINUR neg 07/17/2013 1853   KETONESUR NEGATIVE 01/13/2015 0854   PROTEINUR NEGATIVE 01/13/2015 0854   UROBILINOGEN 0.2 01/13/2015 0854   NITRITE NEGATIVE 01/13/2015 0854   LEUKOCYTESUR NEGATIVE 01/13/2015 0854   Sepsis Labs: @LABRCNTIP (procalcitonin:4,lacticidven:4)   )No results found for this or any previous visit (from the past 240 hour(s)).    Radiology Studies: Dg Chest 2 View Result Date: 12/28/2016 Stable from prior.  No evidence of acute disease.   Ct Angio Chest Pe W Or Wo Contrast Result Date: 12/28/2016 1. Negative for acute pulmonary embolus, pneumonia or other acute cardiopulmonary process. 2. Cholelithiasis. 3. Extensive coronary artery calcifications. Electronically Signed   By: Jacqulynn Cadet M.D.   On: 12/28/2016 13:26     Scheduled Meds: . aspirin EC  325 mg Oral Daily  . gi cocktail  30 mL Oral Once  . lisinopril  20 mg Oral QHS  . nebivolol  5 mg Oral Daily  . pantoprazole  40 mg Oral Daily  . rosuvastatin  40 mg Oral QHS   Continuous Infusions: . heparin 1,200 Units/hr (12/29/16 1018)     LOS: 0 days    Time spent: 15 minutes  Greater than 50% of the time spent on counseling and coordinating the care.   Leisa Lenz, MD [facility] Pager [phone]  If 7PM-7AM, please contact night-coverage [email or web address] Password [email or web address] 12/29/2016, 12:50 PM

## 2016-12-30 ENCOUNTER — Encounter (HOSPITAL_COMMUNITY): Payer: Self-pay | Admitting: Cardiology

## 2016-12-30 ENCOUNTER — Encounter (HOSPITAL_COMMUNITY)
Admission: EM | Disposition: A | Discharge status: Home / Self Care | Payer: Self-pay | Source: Home / Self Care | Attending: Internal Medicine

## 2016-12-30 DIAGNOSIS — Z9889 Other specified postprocedural states: Secondary | ICD-10-CM

## 2016-12-30 DIAGNOSIS — I2510 Atherosclerotic heart disease of native coronary artery without angina pectoris: Secondary | ICD-10-CM

## 2016-12-30 HISTORY — PX: CARDIAC CATHETERIZATION: SHX172

## 2016-12-30 LAB — CBC
HEMATOCRIT: 43.9 % (ref 39.0–52.0)
Hemoglobin: 15 g/dL (ref 13.0–17.0)
MCH: 30.6 pg (ref 26.0–34.0)
MCHC: 34.2 g/dL (ref 30.0–36.0)
MCV: 89.6 fL (ref 78.0–100.0)
Platelets: 184 10*3/uL (ref 150–400)
RBC: 4.9 MIL/uL (ref 4.22–5.81)
RDW: 13.1 % (ref 11.5–15.5)
WBC: 5.3 10*3/uL (ref 4.0–10.5)

## 2016-12-30 LAB — BASIC METABOLIC PANEL
ANION GAP: 7 (ref 5–15)
BUN: 14 mg/dL (ref 6–20)
CO2: 26 mmol/L (ref 22–32)
CREATININE: 1.13 mg/dL (ref 0.61–1.24)
Calcium: 9.5 mg/dL (ref 8.9–10.3)
Chloride: 106 mmol/L (ref 101–111)
Glucose, Bld: 106 mg/dL — ABNORMAL HIGH (ref 65–99)
Potassium: 4 mmol/L (ref 3.5–5.1)
SODIUM: 139 mmol/L (ref 135–145)

## 2016-12-30 LAB — POCT ACTIVATED CLOTTING TIME: Activated Clotting Time: 494 seconds

## 2016-12-30 LAB — HEPARIN LEVEL (UNFRACTIONATED): HEPARIN UNFRACTIONATED: 0.47 [IU]/mL (ref 0.30–0.70)

## 2016-12-30 SURGERY — LEFT HEART CATH AND CORONARY ANGIOGRAPHY
Anesthesia: Moderate Sedation

## 2016-12-30 MED ORDER — SODIUM CHLORIDE 0.9% FLUSH: 3.0000 mL | INTRAVENOUS | Status: DC | PRN

## 2016-12-30 MED ORDER — ACETAMINOPHEN 325 MG PO TABS: 650.0000 mg | ORAL_TABLET | ORAL | 0 refills | Status: AC | PRN

## 2016-12-30 MED ORDER — ASPIRIN 81 MG PO CHEW: 81.0000 mg | CHEWABLE_TABLET | Freq: Every day | ORAL | 0 refills | Status: DC

## 2016-12-30 MED ORDER — FENTANYL CITRATE (PF) 100 MCG/2ML IJ SOLN
INTRAMUSCULAR | Status: AC
  Filled 2016-12-30: qty 2

## 2016-12-30 MED ORDER — TICAGRELOR 90 MG PO TABS
ORAL_TABLET | ORAL | Status: DC | PRN
  Administered 2016-12-30: 180 mg via ORAL

## 2016-12-30 MED ORDER — IOPAMIDOL (ISOVUE-370) INJECTION 76%
INTRAVENOUS | Status: DC | PRN
  Administered 2016-12-30: 120 mL via INTRA_ARTERIAL

## 2016-12-30 MED ORDER — NITROGLYCERIN 1 MG/10 ML FOR IR/CATH LAB
INTRA_ARTERIAL | Status: AC
  Filled 2016-12-30: qty 10

## 2016-12-30 MED ORDER — MIDAZOLAM HCL 2 MG/2ML IJ SOLN
INTRAMUSCULAR | Status: AC
  Filled 2016-12-30: qty 2

## 2016-12-30 MED ORDER — IOPAMIDOL (ISOVUE-370) INJECTION 76%
INTRAVENOUS | Status: AC
  Filled 2016-12-30: qty 100

## 2016-12-30 MED ORDER — SODIUM CHLORIDE 0.9 % IV SOLN
1.7500 mg/kg/h | INTRAVENOUS | Status: AC
  Administered 2016-12-30 (×2): 1.75 mg/kg/h via INTRAVENOUS
  Filled 2016-12-30 (×2): qty 250

## 2016-12-30 MED ORDER — SODIUM CHLORIDE 0.9 % IV SOLN
INTRAVENOUS | Status: DC | PRN
  Administered 2016-12-30 (×2): 1.75 mg/kg/h via INTRAVENOUS

## 2016-12-30 MED ORDER — HEPARIN SODIUM (PORCINE) 1000 UNIT/ML IJ SOLN
INTRAMUSCULAR | Status: DC | PRN
  Administered 2016-12-30: 5000 [IU] via INTRAVENOUS

## 2016-12-30 MED ORDER — HYDRALAZINE HCL 20 MG/ML IJ SOLN: 5.0000 mg | INTRAMUSCULAR | Status: AC | PRN

## 2016-12-30 MED ORDER — TICAGRELOR 90 MG PO TABS: 90.0000 mg | ORAL_TABLET | Freq: Two times a day (BID) | ORAL | 0 refills | Status: DC

## 2016-12-30 MED ORDER — BIVALIRUDIN 250 MG IV SOLR
INTRAVENOUS | Status: AC
  Filled 2016-12-30: qty 250

## 2016-12-30 MED ORDER — ACETAMINOPHEN 325 MG PO TABS
650.0000 mg | ORAL_TABLET | ORAL | Status: DC | PRN
  Administered 2016-12-30: 13:00:00 650 mg via ORAL
  Filled 2016-12-30: qty 2

## 2016-12-30 MED ORDER — TICAGRELOR 90 MG PO TABS: 90.0000 mg | ORAL_TABLET | Freq: Two times a day (BID) | ORAL | Status: DC

## 2016-12-30 MED ORDER — HEPARIN (PORCINE) IN NACL 2-0.9 UNIT/ML-% IJ SOLN
INTRAMUSCULAR | Status: AC
  Filled 2016-12-30: qty 1500

## 2016-12-30 MED ORDER — LIDOCAINE HCL (PF) 1 % IJ SOLN
INTRAMUSCULAR | Status: AC
  Filled 2016-12-30: qty 30

## 2016-12-30 MED ORDER — SODIUM CHLORIDE 0.9 % IV SOLN: INTRAVENOUS | Status: AC

## 2016-12-30 MED ORDER — SODIUM CHLORIDE 0.9 % IV SOLN: 250.0000 mL | INTRAVENOUS | Status: DC | PRN

## 2016-12-30 MED ORDER — HEPARIN (PORCINE) IN NACL 2-0.9 UNIT/ML-% IJ SOLN
INTRAMUSCULAR | Status: DC | PRN
  Administered 2016-12-30: 500 mL via INTRA_ARTERIAL
  Administered 2016-12-30: 1000 mL

## 2016-12-30 MED ORDER — LIDOCAINE HCL (PF) 1 % IJ SOLN
INTRAMUSCULAR | Status: DC | PRN
  Administered 2016-12-30: 5 mL via SUBCUTANEOUS

## 2016-12-30 MED ORDER — TICAGRELOR 90 MG PO TABS
ORAL_TABLET | ORAL | Status: AC
  Filled 2016-12-30: qty 1

## 2016-12-30 MED ORDER — FENTANYL CITRATE (PF) 100 MCG/2ML IJ SOLN
INTRAMUSCULAR | Status: DC | PRN
  Administered 2016-12-30: 25 ug via INTRAVENOUS

## 2016-12-30 MED ORDER — VERAPAMIL HCL 2.5 MG/ML IV SOLN
INTRA_ARTERIAL | Status: DC | PRN
  Administered 2016-12-30: 15 mL via INTRA_ARTERIAL

## 2016-12-30 MED ORDER — MIDAZOLAM HCL 2 MG/2ML IJ SOLN
INTRAMUSCULAR | Status: DC | PRN
  Administered 2016-12-30: 1 mg via INTRAVENOUS

## 2016-12-30 MED ORDER — ASPIRIN 81 MG PO CHEW: 81.0000 mg | CHEWABLE_TABLET | Freq: Every day | ORAL | Status: DC

## 2016-12-30 MED ORDER — ONDANSETRON HCL 4 MG/2ML IJ SOLN: 4.0000 mg | Freq: Four times a day (QID) | INTRAMUSCULAR | Status: DC | PRN

## 2016-12-30 MED ORDER — SODIUM CHLORIDE 0.9% FLUSH: 3.0000 mL | Freq: Two times a day (BID) | INTRAVENOUS | Status: DC

## 2016-12-30 MED ORDER — MORPHINE SULFATE (PF) 2 MG/ML IV SOLN: 2.0000 mg | INTRAVENOUS | Status: DC | PRN

## 2016-12-30 MED ORDER — BIVALIRUDIN BOLUS VIA INFUSION - CUPID
INTRAVENOUS | Status: DC | PRN
  Administered 2016-12-30: 71.85 mg via INTRAVENOUS

## 2016-12-30 MED ORDER — LABETALOL HCL 5 MG/ML IV SOLN: 10.0000 mg | INTRAVENOUS | Status: AC | PRN

## 2016-12-30 MED FILL — BRILINTA 90 MG TABLET: 90 | 30 days supply | Qty: 60 | Fill #0

## 2016-12-30 SURGICAL SUPPLY — 14 items
BALLN MOZEC 2.0X12 (BALLOONS) ×3
BALLOON MOZEC 2.0X12 (BALLOONS) ×1 IMPLANT
CATH OPTITORQUE TIG 4.0 5F (CATHETERS) ×3 IMPLANT
CATH VISTA GUIDE 6FR XBLAD3.5 (CATHETERS) ×3 IMPLANT
DEVICE RAD COMP TR BAND LRG (VASCULAR PRODUCTS) ×3 IMPLANT
GLIDESHEATH SLEND A-KIT 6F 22G (SHEATH) ×3 IMPLANT
KIT ENCORE 26 ADVANTAGE (KITS) ×3 IMPLANT
KIT HEART LEFT (KITS) ×3 IMPLANT
PACK CARDIAC CATHETERIZATION (CUSTOM PROCEDURE TRAY) ×3 IMPLANT
STENT SYNERGY DES 2.25X12 (Permanent Stent) ×3 IMPLANT
TRANSDUCER W/STOPCOCK (MISCELLANEOUS) ×3 IMPLANT
TUBING CIL FLEX 10 FLL-RA (TUBING) ×3 IMPLANT
WIRE ASAHI PROWATER 180CM (WIRE) ×3 IMPLANT
WIRE HI TORQ VERSACORE-J 145CM (WIRE) ×3 IMPLANT

## 2016-12-30 NOTE — Progress Notes (Addendum)
Patient ID: NIXXON GILSTRAP, male   DOB: [birth year], 66 y.o.   MRN: [id number]  PROGRESS NOTE    KADEN JOHL  [id number] DOB: 1951-05-28 DOA: 12/28/2016  PCP: Gerrit Heck, MD   Brief Narrative:  66 y.o. male with medical history significant for CAD status post stents 2011, hyperlipidemia, hypertension who presented to ED with chest pain that started at rest  While watching TV, pressure like, radiating to left shoulder and down to left arm. Lasted for few minutes before it subsided. No associated fevers or chills. No palpitations or lightheadedness or loss of consciousness.   Assessment & Plan:   Principal Problem:   Chest pain / NSTEMI - Pt ruled in with troponin of 0.06 and 0.18 and chest pain - Plan for cardiac cath this am - Continue aspirin, lisinopril and nebivolol - 2-D echo showed ejection fraction [id number], grade 1 diastolic dysfunction, no regional wall motion abnormalities - Continue heparin drip   Active Problems:   Status post total left knee replacement - Stable    Hypertension, essential - Continue lisinopril and nebivolol    Dyslipidemia - Continue statin therapy    DVT prophylaxis: SCD's bilaterally; Heparin drip  Code Status: full code  Family Communication: no family at the bedside this am Disposition Plan: cardiac cath today   Consultants:   Cardio   Procedures:  None   Antimicrobials:   None    Subjective: No overnight events.   Objective: Vitals:   12/30/16 1012 12/30/16 1017 12/30/16 1022 12/30/16 1027  BP: 136/83     Pulse: (!) 0 (!) 0 (!) 0 (!) 0  Resp: (!) 8 (!) 0 (!) 0 (!) 0  Temp:      TempSrc:      SpO2: (!) 0% (!) 0% (!) 0% (!) 0%  Weight:      Height:        Intake/Output Summary (Last 24 hours) at 12/30/16 1050 Last data filed at 12/30/16 0700  Gross per 24 hour  Intake          1223.02 ml  Output             1750 ml  Net          -526.98 ml   Filed Weights   12/28/16 1019 12/28/16 1624    Weight: 95.3 kg (210 lb) 95.8 kg (211 lb 3.2 oz)    Examination:  General exam: Appears calm and comfortable, No acute distress Respiratory system: no wheezing, no rhonchi Cardiovascular system: S1 & S2 heard, RRR. Gastrointestinal system: (+) BS. Non tender abdomen  Central nervous system: No focal neurological deficits. Extremities: No edema, palpable pulses  Skin: skin is warm and dry  Psychiatry: Mood & affect appropriate.   Data Reviewed: I have personally reviewed following labs and imaging studies  CBC:  Recent Labs Lab 12/28/16 1107 12/29/16 0252 12/30/16 0413  WBC 5.8 5.9 5.3  HGB 14.9 14.8 15.0  HCT 42.2 42.7 43.9  MCV 88.3 88.8 89.6  PLT 194 196 [id number]   Basic Metabolic Panel:  Recent Labs Lab 12/28/16 1107 12/30/16 0413  NA 137 139  K 4.1 4.0  CL 104 106  CO2 22 26  GLUCOSE 97 106*  BUN 15 14  CREATININE 1.15 1.13  CALCIUM 9.3 9.5   GFR: Estimated Creatinine Clearance: 75.8 mL/min (by C-G formula based on SCr of 1.13 mg/dL). Liver Function Tests: No results for input(s): AST, ALT, ALKPHOS, BILITOT, PROT, ALBUMIN in the last 168 hours.  No results for input(s): LIPASE, AMYLASE in the last 168 hours. No results for input(s): AMMONIA in the last 168 hours. Coagulation Profile:  Recent Labs Lab 12/29/16 1352  INR 1.06   Cardiac Enzymes:  Recent Labs Lab 12/28/16 1630 12/28/16 1956  TROPONINI 0.06* 0.18*   BNP (last 3 results) No results for input(s): PROBNP in the last 8760 hours. HbA1C:  Recent Labs  12/28/16 1638  HGBA1C 5.2   CBG: No results for input(s): GLUCAP in the last 168 hours. Lipid Profile:  Recent Labs  12/28/16 1630  CHOL 156  HDL 47  LDLCALC 79  TRIG 150*  CHOLHDL 3.3   Thyroid Function Tests: No results for input(s): TSH, T4TOTAL, FREET4, T3FREE, THYROIDAB in the last 72 hours. Anemia Panel: No results for input(s): VITAMINB12, FOLATE, FERRITIN, TIBC, IRON, RETICCTPCT in the last 72 hours. Urine  analysis:    Component Value Date/Time   COLORURINE YELLOW 01/13/2015 [place] 01/13/2015 0854   LABSPEC 1.022 01/13/2015 0854   PHURINE 6.0 01/13/2015 0854   GLUCOSEU NEGATIVE 01/13/2015 0854   HGBUR NEGATIVE 01/13/2015 0854   BILIRUBINUR NEGATIVE 01/13/2015 0854   BILIRUBINUR neg 07/17/2013 1853   KETONESUR NEGATIVE 01/13/2015 0854   PROTEINUR NEGATIVE 01/13/2015 0854   UROBILINOGEN 0.2 01/13/2015 0854   NITRITE NEGATIVE 01/13/2015 0854   LEUKOCYTESUR NEGATIVE 01/13/2015 0854   Sepsis Labs: @LABRCNTIP (procalcitonin:4,lacticidven:4)   )No results found for this or any previous visit (from the past 240 hour(s)).    Radiology Studies: Dg Chest 2 View Result Date: 12/28/2016 Stable from prior.  No evidence of acute disease.   Ct Angio Chest Pe W Or Wo Contrast Result Date: 12/28/2016 1. Negative for acute pulmonary embolus, pneumonia or other acute cardiopulmonary process. 2. Cholelithiasis. 3. Extensive coronary artery calcifications. Electronically Signed   By: Jacqulynn Cadet M.D.   On: 12/28/2016 13:26     Scheduled Meds: . aspirin EC  325 mg Oral Daily  . gi cocktail  30 mL Oral Once  .  lisinopril  20 mg Oral QHS  .  nebivolol  5 mg Oral Daily  .  pantoprazole  40 mg Oral Daily  .  rosuvastatin  40 mg Oral QHS  . sodium chloride flush  3 mL Intravenous Q12H   Continuous Infusions: . sodium chloride 1 mL/kg/hr (12/30/16 0505)  . bivalirudin (ANGIOMAX) infusion 5 mg/mL (Cath Lab,ACS,PCI indication)    . heparin 1,200 Units/hr (12/30/16 0700)     LOS: 1 day    Time spent: 15 minutes  Greater than 50% of the time spent on counseling and coordinating the care.   Leisa Lenz, MD [facility] Pager [phone]  If 7PM-7AM, please contact night-coverage [email or web address] Password [email or web address] 12/30/2016, 10:50 AM

## 2016-12-30 NOTE — Progress Notes (Signed)
Dc instructions given to patient at this time.  Pt verbalized understanding of all instructions.  TR band still in place and pt not to leave until removed and no bleeding or problems.  Waiting on 30 day Brilinta from CM.

## 2016-12-30 NOTE — Discharge Summary (Signed)
Physician Discharge Summary  Jacob Parker [id number] DOB: 1951/08/04 DOA: 12/28/2016  PCP: Gerrit Heck, MD  Admit date: 12/28/2016 Discharge date: 12/30/2016  Recommendations for Outpatient Follow-up:  1. Continue brilinta per cardio recommendations   Discharge Diagnoses:  Principal Problem:   NSTEMI (non-ST elevated myocardial infarction) [facility]) Active Problems:   Essential hypertension   Hyperlipidemia    Discharge Condition: stable   Diet recommendation: as tolerated   History of present illness:  66 y.o.malewith medical history significant for CAD status post stents 2011, hyperlipidemia, hypertension who presented to ED with chest pain that started at rest  While watching TV, pressure like, radiating to left shoulder and down to left arm. Lasted for few minutes before it subsided. No associated fevers or chills. No palpitations or lightheadedness or loss of consciousness.   Hospital Course:    Assessment & Plan:   Principal Problem:   Chest pain / NSTEMI - Pt ruled in with troponin of 0.06 and 0.18 and chest pain - Plan for cardiac cath this am - Continue aspirin, lisinopril and nebivolol - 2-D echo showed ejection fraction A999333, grade 1 diastolic dysfunction, no regional wall motion abnormalities - S/p cardiac cath and coronary stent intervention  - Will be on brilinta per cardio   Active Problems:   Status post total left knee replacement - Stable    Hypertension, essential - Continue lisinopril and nebivolol    Dyslipidemia - Continue statin therapy    DVT prophylaxis: SCD's bilaterally; Heparin drip  Code Status: full code  Family Communication: no family at the bedside this am    Consultants:   Cardio   Procedures:  Cardiac cath - s/p stent with coronary intervention   Antimicrobials:   None     Signed:  Leisa Lenz, MD  [facility] 12/30/2016, 3:56 PM  Pager #: [phone]  Time spent in minutes:  less than 30 minutes  Discharge Exam: Vitals:   12/30/16 1400 12/30/16 1500  BP: (!) 170/88 (!) 162/85  Pulse: 72 73  Resp: 12 17  Temp:     Vitals:   12/30/16 1253 12/30/16 1300 12/30/16 1400 12/30/16 1500  BP:  (!) 158/79 (!) 170/88 (!) 162/85  Pulse: 73 81 72 73  Resp: 12 (!) 23 12 17   Temp:      TempSrc:      SpO2: 97% 96% 96% 95%  Weight:      Height:        General: Pt is alert, follows commands appropriately, not in acute distress Cardiovascular: Regular rate and rhythm, S1/S2 +, no murmurs Respiratory: Clear to auscultation bilaterally, no wheezing, no crackles, no rhonchi Abdominal: Soft, non tender, non distended, bowel sounds +, no guarding Extremities: no edema, no cyanosis, pulses palpable bilaterally DP and PT Neuro: Grossly nonfocal  Discharge Instructions  Discharge Instructions    AMB Referral to Cardiac Rehabilitation - Phase II    Complete by:  As directed    Diagnosis:  Coronary Stents   Call MD for:  persistant nausea and vomiting    Complete by:  As directed    Call MD for:  redness, tenderness, or signs of infection (pain, swelling, redness, odor or green/yellow discharge around incision site)    Complete by:  As directed    Call MD for:  severe uncontrolled pain    Complete by:  As directed    Diet - low sodium heart healthy    Complete by:  As directed    Increase activity slowly  Complete by:  As directed      Allergies as of 12/30/2016   No Known Allergies     Medication List    STOP taking these medications   aspirin EC 325 MG tablet Replaced by:  aspirin 81 MG chewable tablet   aspirin EC 81 MG tablet   oxyCODONE-acetaminophen 5-325 MG tablet Commonly known as:  PERCOCET/ROXICET   tiZANidine 2 MG tablet Commonly known as:  ZANAFLEX     TAKE these medications   acetaminophen 325 MG tablet Commonly known as:  TYLENOL Take 2 tablets (650 mg total) by mouth every 4 (four) hours as needed for headache or mild pain.    aspirin 81 MG chewable tablet Chew 1 tablet (81 mg total) by mouth daily. Start taking on:  12/31/2016 Replaces:  aspirin EC 325 MG tablet   esomeprazole 20 MG capsule Commonly known as:  NEXIUM Take 20 mg by mouth daily before breakfast.   Fish Oil 1000 MG Caps Take 1,000 mg by mouth daily.   HYDROcodone-acetaminophen 5-325 MG tablet Commonly known as:  NORCO/VICODIN Take 1 tablet by mouth every 6 (six) hours as needed for moderate pain.   lisinopril 20 MG tablet Commonly known as:  PRINIVIL,ZESTRIL Take 20 mg by mouth at bedtime.   multivitamin with minerals Tabs tablet Take 1 tablet by mouth daily.   nebivolol 5 MG tablet Commonly known as:  BYSTOLIC Take 5 mg by mouth daily.   oxyCODONE 20 mg 12 hr tablet Commonly known as:  OXYCONTIN Take 1 tablet (20 mg total) by mouth every 12 (twelve) hours.   rosuvastatin 40 MG tablet Commonly known as:  CRESTOR Take 40 mg by mouth at bedtime.   ticagrelor 90 MG Tabs tablet Commonly known as:  BRILINTA Take 1 tablet (90 mg total) by mouth 2 (two) times daily.   zolpidem 10 MG tablet Commonly known as:  AMBIEN Take 10 mg by mouth at bedtime as needed for sleep.      Follow-up Information    Gerrit Heck, MD. Schedule an appointment as soon as possible for a visit in 1 week(s).   Specialty:  Family Medicine Contact information: [place] [address] [phone]            The results of significant diagnostics from this hospitalization (including imaging, microbiology, ancillary and laboratory) are listed below for reference.    Significant Diagnostic Studies: Dg Chest 2 View  Result Date: 12/28/2016 CLINICAL DATA:  Chest pain EXAM: CHEST  2 VIEW COMPARISON:  11/08/2014 FINDINGS: Normal heart size. Stable mediastinal contours including mild prominence of the main pulmonary arterial contour. Possibility of mild pleural thickening in the lateral left mid chest, stable. No calcified  pleural plaques noted. There is no edema, consolidation, effusion, or pneumothorax. Status post coronary stenting. IMPRESSION: Stable from prior.  No evidence of acute disease. Electronically Signed   By: Monte Fantasia M.D.   On: 12/28/2016 11:00   Ct Angio Chest Pe W Or Wo Contrast  Result Date: 12/28/2016 CLINICAL DATA:  66 year old male with chest pressure radiating down the left arm EXAM: CT ANGIOGRAPHY CHEST WITH CONTRAST TECHNIQUE: Multidetector CT imaging of the chest was performed using the standard protocol during bolus administration of intravenous contrast. Multiplanar CT image reconstructions and MIPs were obtained to evaluate the vascular anatomy. CONTRAST:  80 mL Isovue 370 COMPARISON:  Chest x-ray obtained earlier today FINDINGS: Cardiovascular: Adequate opacification of pulmonary arteries to the proximal subsegmental level. No evidence of acute pulmonary  embolus. Four vessel arch anatomy. The left vertebral artery arises directly from the aorta. No evidence of aneurysm or dissection. Calcifications are present throughout the coronary arteries. The heart is normal in size. No pericardial effusion. Mediastinum/Nodes: Unremarkable CT appearance of the thyroid gland. No suspicious mediastinal or hilar adenopathy. No soft tissue mediastinal mass. The thoracic esophagus is unremarkable. Lungs/Pleura: Trace dependent atelectasis in the inferior aspect of the lingula and left lower lobe. Otherwise, the lungs are clear. Upper Abdomen: Surgical clips present on either side of the caudate lobe of the liver. Gallstones present within the gallbladder. No evidence gallbladder distention or wall thickening. The remainder of the upper abdomen is unremarkable in appearance. Musculoskeletal: No acute fracture or aggressive appearing lytic or blastic osseous lesion. Review of the MIP images confirms the above findings. IMPRESSION: 1. Negative for acute pulmonary embolus, pneumonia or other acute cardiopulmonary  process. 2. Cholelithiasis. 3. Extensive coronary artery calcifications. Electronically Signed   By: Jacqulynn Cadet M.D.   On: 12/28/2016 13:26    Microbiology: No results found for this or any previous visit (from the past 240 hour(s)).   Labs: Basic Metabolic Panel:  Recent Labs Lab 12/28/16 1107 12/30/16 0413  NA 137 139  K 4.1 4.0  CL 104 106  CO2 22 26  GLUCOSE 97 106*  BUN 15 14  CREATININE 1.15 1.13  CALCIUM 9.3 9.5   Liver Function Tests: No results for input(s): AST, ALT, ALKPHOS, BILITOT, PROT, ALBUMIN in the last 168 hours. No results for input(s): LIPASE, AMYLASE in the last 168 hours. No results for input(s): AMMONIA in the last 168 hours. CBC:  Recent Labs Lab 12/28/16 1107 12/29/16 0252 12/30/16 0413  WBC 5.8 5.9 5.3  HGB 14.9 14.8 15.0  HCT 42.2 42.7 43.9  MCV 88.3 88.8 89.6  PLT 194 196 184   Cardiac Enzymes:  Recent Labs Lab 12/28/16 1630 12/28/16 1956  TROPONINI 0.06* 0.18*   BNP: BNP (last 3 results) No results for input(s): BNP in the last 8760 hours.  ProBNP (last 3 results) No results for input(s): PROBNP in the last 8760 hours.  CBG: No results for input(s): GLUCAP in the last 168 hours.

## 2016-12-30 NOTE — Care Management Note (Addendum)
Case Management Note  Patient Details  Name: Jacob Parker MRN: [id number] Date of Birth: [birth year]  Subjective/Objective:   S/p coronary stent intervention, will be on brilinta, NCM awaiting benefit check, will cont to follow for dc needs.    1/9 [place], BSN- NCM called patient on the phone gave him the information concerning his co pay for brilinta which is 242.00 until deductible is met.  NCM informed him to let cardiologist know at next apt , in case he will need to get samples from him or need to be switched to something else.  Patient states he has the 30 day savings card , informed him to check with the pharmacist before he goes to get it filled to make sure they have it in stock.                   Action/Plan:   Expected Discharge Date:                  Expected Discharge Plan:  Home/Self Care  In-House Referral:     Discharge planning Services  CM Consult  Post Acute Care Choice:    Choice offered to:     DME Arranged:    DME Agency:     HH Arranged:    HH Agency:     Status of Service:  In process, will continue to follow  If discussed at Long Length of Stay Meetings, dates discussed:    Additional Comments:  Zenon Mayo, RN 12/30/2016, 2:58 PM

## 2016-12-30 NOTE — Discharge Instructions (Signed)

## 2016-12-30 NOTE — Progress Notes (Signed)
TR band off.  No bleeding noted.  Pt getting dressed for dc.  No s/s of any acute distress or c/o pain.

## 2016-12-30 NOTE — Progress Notes (Signed)
Appreciate Dr. Kennon Holter help.  Films reviewed.  I will seen in office  Next week.  Kerry Hough MD [facility] 12:31 PM

## 2016-12-31 ENCOUNTER — Telehealth (HOSPITAL_COMMUNITY): Payer: Self-pay | Admitting: *Deleted

## 2016-12-31 NOTE — Progress Notes (Signed)
S/W CHRISTINA @ [facility] # [phone]   BRILINTA 90 MG BID  30 / 560 TAB   COVER- YES  CO-PAY- $ 242.00 ( DEDUCTIBLE NOT MET )  TIER- 3 DRUG  PRIOR APPROVAL- NO  PHARMACY: [facility], [facility] AND [facility]

## 2017-01-06 DIAGNOSIS — M2550 Pain in unspecified joint: Secondary | ICD-10-CM | POA: Diagnosis not present

## 2017-01-06 DIAGNOSIS — I214 Non-ST elevation (NSTEMI) myocardial infarction: Secondary | ICD-10-CM | POA: Diagnosis not present

## 2017-01-06 DIAGNOSIS — G479 Sleep disorder, unspecified: Secondary | ICD-10-CM | POA: Diagnosis not present

## 2017-01-07 DIAGNOSIS — I2510 Atherosclerotic heart disease of native coronary artery without angina pectoris: Secondary | ICD-10-CM | POA: Diagnosis not present

## 2017-01-07 DIAGNOSIS — K219 Gastro-esophageal reflux disease without esophagitis: Secondary | ICD-10-CM | POA: Diagnosis not present

## 2017-01-07 DIAGNOSIS — I10 Essential (primary) hypertension: Secondary | ICD-10-CM | POA: Diagnosis not present

## 2017-01-07 DIAGNOSIS — E785 Hyperlipidemia, unspecified: Secondary | ICD-10-CM | POA: Diagnosis not present

## 2017-01-07 DIAGNOSIS — Z955 Presence of coronary angioplasty implant and graft: Secondary | ICD-10-CM | POA: Diagnosis not present

## 2017-02-12 DIAGNOSIS — I10 Essential (primary) hypertension: Secondary | ICD-10-CM | POA: Diagnosis not present

## 2017-02-12 DIAGNOSIS — Z85828 Personal history of other malignant neoplasm of skin: Secondary | ICD-10-CM | POA: Diagnosis not present

## 2017-02-12 DIAGNOSIS — E781 Pure hyperglyceridemia: Secondary | ICD-10-CM | POA: Diagnosis not present

## 2017-02-12 DIAGNOSIS — Z125 Encounter for screening for malignant neoplasm of prostate: Secondary | ICD-10-CM | POA: Diagnosis not present

## 2017-02-14 DIAGNOSIS — Z955 Presence of coronary angioplasty implant and graft: Secondary | ICD-10-CM | POA: Diagnosis not present

## 2017-02-14 DIAGNOSIS — R194 Change in bowel habit: Secondary | ICD-10-CM | POA: Diagnosis not present

## 2017-02-14 DIAGNOSIS — Z1389 Encounter for screening for other disorder: Secondary | ICD-10-CM | POA: Diagnosis not present

## 2017-02-14 DIAGNOSIS — M2550 Pain in unspecified joint: Secondary | ICD-10-CM | POA: Diagnosis not present

## 2017-02-14 DIAGNOSIS — I2510 Atherosclerotic heart disease of native coronary artery without angina pectoris: Secondary | ICD-10-CM | POA: Diagnosis not present

## 2017-02-14 DIAGNOSIS — K921 Melena: Secondary | ICD-10-CM | POA: Diagnosis not present

## 2017-02-14 DIAGNOSIS — Z0000 Encounter for general adult medical examination without abnormal findings: Secondary | ICD-10-CM | POA: Diagnosis not present

## 2017-02-14 DIAGNOSIS — Z8042 Family history of malignant neoplasm of prostate: Secondary | ICD-10-CM | POA: Diagnosis not present

## 2017-02-14 DIAGNOSIS — G479 Sleep disorder, unspecified: Secondary | ICD-10-CM | POA: Diagnosis not present

## 2017-02-21 ENCOUNTER — Telehealth (HOSPITAL_COMMUNITY): Payer: Self-pay | Admitting: Family Medicine

## 2017-02-21 NOTE — Telephone Encounter (Signed)
Insurance benefits verified. Patient has [employer] Medicare: $10 co-payment, no deductible, out of pocket $5900/$398.27 has been met, no co-insurance, no pre-authorization and no limit on visit. Passport/reference # [phone]. Information given to [facility] for review.

## 2017-03-04 DIAGNOSIS — K5900 Constipation, unspecified: Secondary | ICD-10-CM | POA: Diagnosis not present

## 2017-03-04 DIAGNOSIS — K648 Other hemorrhoids: Secondary | ICD-10-CM | POA: Diagnosis not present

## 2017-03-05 DIAGNOSIS — M5431 Sciatica, right side: Secondary | ICD-10-CM | POA: Diagnosis not present

## 2017-03-05 DIAGNOSIS — M9902 Segmental and somatic dysfunction of thoracic region: Secondary | ICD-10-CM | POA: Diagnosis not present

## 2017-03-05 DIAGNOSIS — M9903 Segmental and somatic dysfunction of lumbar region: Secondary | ICD-10-CM | POA: Diagnosis not present

## 2017-03-05 DIAGNOSIS — M5134 Other intervertebral disc degeneration, thoracic region: Secondary | ICD-10-CM | POA: Diagnosis not present

## 2017-03-05 DIAGNOSIS — M5137 Other intervertebral disc degeneration, lumbosacral region: Secondary | ICD-10-CM | POA: Diagnosis not present

## 2017-03-05 DIAGNOSIS — M9905 Segmental and somatic dysfunction of pelvic region: Secondary | ICD-10-CM | POA: Diagnosis not present

## 2017-03-11 ENCOUNTER — Telehealth (HOSPITAL_COMMUNITY): Payer: Self-pay | Admitting: Family Medicine

## 2017-03-11 NOTE — Telephone Encounter (Signed)
I called patient to discuss participating and scheduling for the Cardiac Rehab program. Patient voicemail full and unable to leave a message at this time. I will call patient back later today.

## 2017-03-11 NOTE — Telephone Encounter (Signed)
I called patient back a second time and unable to leave message, voicemail is full. I have mailed patient a letter about Cardiac Rehab program.

## 2017-03-18 ENCOUNTER — Telehealth (HOSPITAL_COMMUNITY): Payer: Self-pay | Admitting: Family Medicine

## 2017-03-18 NOTE — Telephone Encounter (Signed)
Pt called states he received our letter, he's doing fine and working out at the gym and exercising from home. Will c/b later if interested. .... KJ

## 2017-03-24 DIAGNOSIS — G4733 Obstructive sleep apnea (adult) (pediatric): Secondary | ICD-10-CM | POA: Diagnosis not present

## 2017-04-08 DIAGNOSIS — K219 Gastro-esophageal reflux disease without esophagitis: Secondary | ICD-10-CM | POA: Diagnosis not present

## 2017-04-08 DIAGNOSIS — Z955 Presence of coronary angioplasty implant and graft: Secondary | ICD-10-CM | POA: Diagnosis not present

## 2017-04-08 DIAGNOSIS — E785 Hyperlipidemia, unspecified: Secondary | ICD-10-CM | POA: Diagnosis not present

## 2017-04-08 DIAGNOSIS — I2510 Atherosclerotic heart disease of native coronary artery without angina pectoris: Secondary | ICD-10-CM | POA: Diagnosis not present

## 2017-04-08 DIAGNOSIS — I10 Essential (primary) hypertension: Secondary | ICD-10-CM | POA: Diagnosis not present

## 2017-04-29 DIAGNOSIS — M5136 Other intervertebral disc degeneration, lumbar region: Secondary | ICD-10-CM | POA: Diagnosis not present

## 2017-04-29 DIAGNOSIS — M545 Low back pain: Secondary | ICD-10-CM | POA: Diagnosis not present

## 2017-04-29 DIAGNOSIS — M9903 Segmental and somatic dysfunction of lumbar region: Secondary | ICD-10-CM | POA: Diagnosis not present

## 2017-04-29 DIAGNOSIS — M62830 Muscle spasm of back: Secondary | ICD-10-CM | POA: Diagnosis not present

## 2017-05-13 DIAGNOSIS — M5136 Other intervertebral disc degeneration, lumbar region: Secondary | ICD-10-CM | POA: Diagnosis not present

## 2017-05-13 DIAGNOSIS — M62830 Muscle spasm of back: Secondary | ICD-10-CM | POA: Diagnosis not present

## 2017-05-13 DIAGNOSIS — M545 Low back pain: Secondary | ICD-10-CM | POA: Diagnosis not present

## 2017-05-13 DIAGNOSIS — M9903 Segmental and somatic dysfunction of lumbar region: Secondary | ICD-10-CM | POA: Diagnosis not present

## 2017-05-14 DIAGNOSIS — M545 Low back pain: Secondary | ICD-10-CM | POA: Diagnosis not present

## 2017-05-14 DIAGNOSIS — M62830 Muscle spasm of back: Secondary | ICD-10-CM | POA: Diagnosis not present

## 2017-05-14 DIAGNOSIS — M9903 Segmental and somatic dysfunction of lumbar region: Secondary | ICD-10-CM | POA: Diagnosis not present

## 2017-05-14 DIAGNOSIS — M5136 Other intervertebral disc degeneration, lumbar region: Secondary | ICD-10-CM | POA: Diagnosis not present

## 2017-05-15 DIAGNOSIS — M545 Low back pain: Secondary | ICD-10-CM | POA: Diagnosis not present

## 2017-05-15 DIAGNOSIS — M5136 Other intervertebral disc degeneration, lumbar region: Secondary | ICD-10-CM | POA: Diagnosis not present

## 2017-05-15 DIAGNOSIS — M62830 Muscle spasm of back: Secondary | ICD-10-CM | POA: Diagnosis not present

## 2017-05-15 DIAGNOSIS — M9903 Segmental and somatic dysfunction of lumbar region: Secondary | ICD-10-CM | POA: Diagnosis not present

## 2017-05-20 DIAGNOSIS — M9903 Segmental and somatic dysfunction of lumbar region: Secondary | ICD-10-CM | POA: Diagnosis not present

## 2017-05-20 DIAGNOSIS — M545 Low back pain: Secondary | ICD-10-CM | POA: Diagnosis not present

## 2017-05-20 DIAGNOSIS — M62830 Muscle spasm of back: Secondary | ICD-10-CM | POA: Diagnosis not present

## 2017-05-20 DIAGNOSIS — M5136 Other intervertebral disc degeneration, lumbar region: Secondary | ICD-10-CM | POA: Diagnosis not present

## 2017-05-21 DIAGNOSIS — L570 Actinic keratosis: Secondary | ICD-10-CM | POA: Diagnosis not present

## 2017-05-21 DIAGNOSIS — D485 Neoplasm of uncertain behavior of skin: Secondary | ICD-10-CM | POA: Diagnosis not present

## 2017-05-21 DIAGNOSIS — M9903 Segmental and somatic dysfunction of lumbar region: Secondary | ICD-10-CM | POA: Diagnosis not present

## 2017-05-21 DIAGNOSIS — M5136 Other intervertebral disc degeneration, lumbar region: Secondary | ICD-10-CM | POA: Diagnosis not present

## 2017-05-21 DIAGNOSIS — L814 Other melanin hyperpigmentation: Secondary | ICD-10-CM | POA: Diagnosis not present

## 2017-05-21 DIAGNOSIS — M62830 Muscle spasm of back: Secondary | ICD-10-CM | POA: Diagnosis not present

## 2017-05-21 DIAGNOSIS — D1801 Hemangioma of skin and subcutaneous tissue: Secondary | ICD-10-CM | POA: Diagnosis not present

## 2017-05-21 DIAGNOSIS — C44329 Squamous cell carcinoma of skin of other parts of face: Secondary | ICD-10-CM | POA: Diagnosis not present

## 2017-05-21 DIAGNOSIS — L821 Other seborrheic keratosis: Secondary | ICD-10-CM | POA: Diagnosis not present

## 2017-05-21 DIAGNOSIS — Z85828 Personal history of other malignant neoplasm of skin: Secondary | ICD-10-CM | POA: Diagnosis not present

## 2017-05-21 DIAGNOSIS — M545 Low back pain: Secondary | ICD-10-CM | POA: Diagnosis not present

## 2017-05-22 DIAGNOSIS — M9903 Segmental and somatic dysfunction of lumbar region: Secondary | ICD-10-CM | POA: Diagnosis not present

## 2017-05-22 DIAGNOSIS — M5136 Other intervertebral disc degeneration, lumbar region: Secondary | ICD-10-CM | POA: Diagnosis not present

## 2017-05-22 DIAGNOSIS — M62830 Muscle spasm of back: Secondary | ICD-10-CM | POA: Diagnosis not present

## 2017-05-22 DIAGNOSIS — M545 Low back pain: Secondary | ICD-10-CM | POA: Diagnosis not present

## 2017-05-27 DIAGNOSIS — M62830 Muscle spasm of back: Secondary | ICD-10-CM | POA: Diagnosis not present

## 2017-05-27 DIAGNOSIS — M5136 Other intervertebral disc degeneration, lumbar region: Secondary | ICD-10-CM | POA: Diagnosis not present

## 2017-05-27 DIAGNOSIS — M9903 Segmental and somatic dysfunction of lumbar region: Secondary | ICD-10-CM | POA: Diagnosis not present

## 2017-05-27 DIAGNOSIS — M545 Low back pain: Secondary | ICD-10-CM | POA: Diagnosis not present

## 2017-05-28 DIAGNOSIS — M9903 Segmental and somatic dysfunction of lumbar region: Secondary | ICD-10-CM | POA: Diagnosis not present

## 2017-05-28 DIAGNOSIS — M5136 Other intervertebral disc degeneration, lumbar region: Secondary | ICD-10-CM | POA: Diagnosis not present

## 2017-05-28 DIAGNOSIS — M62830 Muscle spasm of back: Secondary | ICD-10-CM | POA: Diagnosis not present

## 2017-05-28 DIAGNOSIS — M545 Low back pain: Secondary | ICD-10-CM | POA: Diagnosis not present

## 2017-05-29 DIAGNOSIS — M62830 Muscle spasm of back: Secondary | ICD-10-CM | POA: Diagnosis not present

## 2017-05-29 DIAGNOSIS — M545 Low back pain: Secondary | ICD-10-CM | POA: Diagnosis not present

## 2017-05-29 DIAGNOSIS — M5136 Other intervertebral disc degeneration, lumbar region: Secondary | ICD-10-CM | POA: Diagnosis not present

## 2017-05-29 DIAGNOSIS — M9903 Segmental and somatic dysfunction of lumbar region: Secondary | ICD-10-CM | POA: Diagnosis not present

## 2017-06-04 DIAGNOSIS — H524 Presbyopia: Secondary | ICD-10-CM | POA: Diagnosis not present

## 2017-06-04 DIAGNOSIS — M545 Low back pain: Secondary | ICD-10-CM | POA: Diagnosis not present

## 2017-06-04 DIAGNOSIS — M9903 Segmental and somatic dysfunction of lumbar region: Secondary | ICD-10-CM | POA: Diagnosis not present

## 2017-06-04 DIAGNOSIS — M5136 Other intervertebral disc degeneration, lumbar region: Secondary | ICD-10-CM | POA: Diagnosis not present

## 2017-06-04 DIAGNOSIS — M62830 Muscle spasm of back: Secondary | ICD-10-CM | POA: Diagnosis not present

## 2017-06-05 DIAGNOSIS — M545 Low back pain: Secondary | ICD-10-CM | POA: Diagnosis not present

## 2017-06-05 DIAGNOSIS — M9903 Segmental and somatic dysfunction of lumbar region: Secondary | ICD-10-CM | POA: Diagnosis not present

## 2017-06-05 DIAGNOSIS — M5136 Other intervertebral disc degeneration, lumbar region: Secondary | ICD-10-CM | POA: Diagnosis not present

## 2017-06-05 DIAGNOSIS — M62830 Muscle spasm of back: Secondary | ICD-10-CM | POA: Diagnosis not present

## 2017-06-10 DIAGNOSIS — M545 Low back pain: Secondary | ICD-10-CM | POA: Diagnosis not present

## 2017-06-10 DIAGNOSIS — M9903 Segmental and somatic dysfunction of lumbar region: Secondary | ICD-10-CM | POA: Diagnosis not present

## 2017-06-10 DIAGNOSIS — M62830 Muscle spasm of back: Secondary | ICD-10-CM | POA: Diagnosis not present

## 2017-06-10 DIAGNOSIS — D0439 Carcinoma in situ of skin of other parts of face: Secondary | ICD-10-CM | POA: Diagnosis not present

## 2017-06-10 DIAGNOSIS — M5136 Other intervertebral disc degeneration, lumbar region: Secondary | ICD-10-CM | POA: Diagnosis not present

## 2017-06-10 DIAGNOSIS — L570 Actinic keratosis: Secondary | ICD-10-CM | POA: Diagnosis not present

## 2017-06-17 DIAGNOSIS — M5136 Other intervertebral disc degeneration, lumbar region: Secondary | ICD-10-CM | POA: Diagnosis not present

## 2017-06-17 DIAGNOSIS — M62830 Muscle spasm of back: Secondary | ICD-10-CM | POA: Diagnosis not present

## 2017-06-17 DIAGNOSIS — M545 Low back pain: Secondary | ICD-10-CM | POA: Diagnosis not present

## 2017-06-17 DIAGNOSIS — M9903 Segmental and somatic dysfunction of lumbar region: Secondary | ICD-10-CM | POA: Diagnosis not present

## 2017-06-18 DIAGNOSIS — M5136 Other intervertebral disc degeneration, lumbar region: Secondary | ICD-10-CM | POA: Diagnosis not present

## 2017-06-18 DIAGNOSIS — M545 Low back pain: Secondary | ICD-10-CM | POA: Diagnosis not present

## 2017-06-18 DIAGNOSIS — M9903 Segmental and somatic dysfunction of lumbar region: Secondary | ICD-10-CM | POA: Diagnosis not present

## 2017-06-18 DIAGNOSIS — M62830 Muscle spasm of back: Secondary | ICD-10-CM | POA: Diagnosis not present

## 2017-06-24 DIAGNOSIS — M9903 Segmental and somatic dysfunction of lumbar region: Secondary | ICD-10-CM | POA: Diagnosis not present

## 2017-06-24 DIAGNOSIS — M62830 Muscle spasm of back: Secondary | ICD-10-CM | POA: Diagnosis not present

## 2017-06-24 DIAGNOSIS — M545 Low back pain: Secondary | ICD-10-CM | POA: Diagnosis not present

## 2017-06-24 DIAGNOSIS — M5136 Other intervertebral disc degeneration, lumbar region: Secondary | ICD-10-CM | POA: Diagnosis not present

## 2017-06-26 DIAGNOSIS — M62830 Muscle spasm of back: Secondary | ICD-10-CM | POA: Diagnosis not present

## 2017-06-26 DIAGNOSIS — M5136 Other intervertebral disc degeneration, lumbar region: Secondary | ICD-10-CM | POA: Diagnosis not present

## 2017-06-26 DIAGNOSIS — M9903 Segmental and somatic dysfunction of lumbar region: Secondary | ICD-10-CM | POA: Diagnosis not present

## 2017-06-26 DIAGNOSIS — M545 Low back pain: Secondary | ICD-10-CM | POA: Diagnosis not present

## 2017-07-01 DIAGNOSIS — G4733 Obstructive sleep apnea (adult) (pediatric): Secondary | ICD-10-CM | POA: Diagnosis not present

## 2017-07-31 DIAGNOSIS — M5137 Other intervertebral disc degeneration, lumbosacral region: Secondary | ICD-10-CM | POA: Diagnosis not present

## 2017-07-31 DIAGNOSIS — M5134 Other intervertebral disc degeneration, thoracic region: Secondary | ICD-10-CM | POA: Diagnosis not present

## 2017-07-31 DIAGNOSIS — M9902 Segmental and somatic dysfunction of thoracic region: Secondary | ICD-10-CM | POA: Diagnosis not present

## 2017-07-31 DIAGNOSIS — M5431 Sciatica, right side: Secondary | ICD-10-CM | POA: Diagnosis not present

## 2017-07-31 DIAGNOSIS — M9903 Segmental and somatic dysfunction of lumbar region: Secondary | ICD-10-CM | POA: Diagnosis not present

## 2017-07-31 DIAGNOSIS — M9905 Segmental and somatic dysfunction of pelvic region: Secondary | ICD-10-CM | POA: Diagnosis not present

## 2017-08-27 DIAGNOSIS — M5134 Other intervertebral disc degeneration, thoracic region: Secondary | ICD-10-CM | POA: Diagnosis not present

## 2017-08-27 DIAGNOSIS — M9905 Segmental and somatic dysfunction of pelvic region: Secondary | ICD-10-CM | POA: Diagnosis not present

## 2017-08-27 DIAGNOSIS — M5137 Other intervertebral disc degeneration, lumbosacral region: Secondary | ICD-10-CM | POA: Diagnosis not present

## 2017-08-27 DIAGNOSIS — M9903 Segmental and somatic dysfunction of lumbar region: Secondary | ICD-10-CM | POA: Diagnosis not present

## 2017-08-27 DIAGNOSIS — M9902 Segmental and somatic dysfunction of thoracic region: Secondary | ICD-10-CM | POA: Diagnosis not present

## 2017-08-27 DIAGNOSIS — M5431 Sciatica, right side: Secondary | ICD-10-CM | POA: Diagnosis not present

## 2017-08-28 DIAGNOSIS — M25561 Pain in right knee: Secondary | ICD-10-CM | POA: Diagnosis not present

## 2017-08-28 DIAGNOSIS — Z96651 Presence of right artificial knee joint: Secondary | ICD-10-CM | POA: Diagnosis not present

## 2017-08-28 DIAGNOSIS — T8484XA Pain due to internal orthopedic prosthetic devices, implants and grafts, initial encounter: Secondary | ICD-10-CM | POA: Diagnosis not present

## 2017-10-01 DIAGNOSIS — G4733 Obstructive sleep apnea (adult) (pediatric): Secondary | ICD-10-CM | POA: Diagnosis not present

## 2017-10-08 DIAGNOSIS — M5134 Other intervertebral disc degeneration, thoracic region: Secondary | ICD-10-CM | POA: Diagnosis not present

## 2017-10-08 DIAGNOSIS — M5137 Other intervertebral disc degeneration, lumbosacral region: Secondary | ICD-10-CM | POA: Diagnosis not present

## 2017-10-08 DIAGNOSIS — M9905 Segmental and somatic dysfunction of pelvic region: Secondary | ICD-10-CM | POA: Diagnosis not present

## 2017-10-08 DIAGNOSIS — M5431 Sciatica, right side: Secondary | ICD-10-CM | POA: Diagnosis not present

## 2017-10-08 DIAGNOSIS — M9903 Segmental and somatic dysfunction of lumbar region: Secondary | ICD-10-CM | POA: Diagnosis not present

## 2017-10-08 DIAGNOSIS — M9902 Segmental and somatic dysfunction of thoracic region: Secondary | ICD-10-CM | POA: Diagnosis not present

## 2017-11-17 DIAGNOSIS — Z23 Encounter for immunization: Secondary | ICD-10-CM | POA: Diagnosis not present

## 2017-11-18 DIAGNOSIS — M9905 Segmental and somatic dysfunction of pelvic region: Secondary | ICD-10-CM | POA: Diagnosis not present

## 2017-11-18 DIAGNOSIS — M5137 Other intervertebral disc degeneration, lumbosacral region: Secondary | ICD-10-CM | POA: Diagnosis not present

## 2017-11-18 DIAGNOSIS — M5431 Sciatica, right side: Secondary | ICD-10-CM | POA: Diagnosis not present

## 2017-11-18 DIAGNOSIS — M9903 Segmental and somatic dysfunction of lumbar region: Secondary | ICD-10-CM | POA: Diagnosis not present

## 2017-11-18 DIAGNOSIS — M5134 Other intervertebral disc degeneration, thoracic region: Secondary | ICD-10-CM | POA: Diagnosis not present

## 2017-11-18 DIAGNOSIS — M9902 Segmental and somatic dysfunction of thoracic region: Secondary | ICD-10-CM | POA: Diagnosis not present

## 2017-11-24 DIAGNOSIS — K219 Gastro-esophageal reflux disease without esophagitis: Secondary | ICD-10-CM | POA: Diagnosis not present

## 2017-11-24 DIAGNOSIS — I119 Hypertensive heart disease without heart failure: Secondary | ICD-10-CM | POA: Diagnosis not present

## 2017-11-24 DIAGNOSIS — I2510 Atherosclerotic heart disease of native coronary artery without angina pectoris: Secondary | ICD-10-CM | POA: Diagnosis not present

## 2017-11-24 DIAGNOSIS — Z955 Presence of coronary angioplasty implant and graft: Secondary | ICD-10-CM | POA: Diagnosis not present

## 2017-11-24 DIAGNOSIS — E7849 Other hyperlipidemia: Secondary | ICD-10-CM | POA: Diagnosis not present

## 2017-11-24 DIAGNOSIS — I10 Essential (primary) hypertension: Secondary | ICD-10-CM | POA: Diagnosis not present

## 2017-12-29 DIAGNOSIS — M5431 Sciatica, right side: Secondary | ICD-10-CM | POA: Diagnosis not present

## 2017-12-29 DIAGNOSIS — M9902 Segmental and somatic dysfunction of thoracic region: Secondary | ICD-10-CM | POA: Diagnosis not present

## 2017-12-29 DIAGNOSIS — M5137 Other intervertebral disc degeneration, lumbosacral region: Secondary | ICD-10-CM | POA: Diagnosis not present

## 2017-12-29 DIAGNOSIS — M5134 Other intervertebral disc degeneration, thoracic region: Secondary | ICD-10-CM | POA: Diagnosis not present

## 2017-12-29 DIAGNOSIS — M9905 Segmental and somatic dysfunction of pelvic region: Secondary | ICD-10-CM | POA: Diagnosis not present

## 2017-12-29 DIAGNOSIS — M9903 Segmental and somatic dysfunction of lumbar region: Secondary | ICD-10-CM | POA: Diagnosis not present

## 2018-01-02 DIAGNOSIS — G4733 Obstructive sleep apnea (adult) (pediatric): Secondary | ICD-10-CM | POA: Diagnosis not present

## 2018-01-27 DIAGNOSIS — M9903 Segmental and somatic dysfunction of lumbar region: Secondary | ICD-10-CM | POA: Diagnosis not present

## 2018-01-27 DIAGNOSIS — M9902 Segmental and somatic dysfunction of thoracic region: Secondary | ICD-10-CM | POA: Diagnosis not present

## 2018-01-27 DIAGNOSIS — M5431 Sciatica, right side: Secondary | ICD-10-CM | POA: Diagnosis not present

## 2018-01-27 DIAGNOSIS — M5134 Other intervertebral disc degeneration, thoracic region: Secondary | ICD-10-CM | POA: Diagnosis not present

## 2018-01-27 DIAGNOSIS — M5137 Other intervertebral disc degeneration, lumbosacral region: Secondary | ICD-10-CM | POA: Diagnosis not present

## 2018-01-27 DIAGNOSIS — M9905 Segmental and somatic dysfunction of pelvic region: Secondary | ICD-10-CM | POA: Diagnosis not present

## 2018-02-03 DIAGNOSIS — M25572 Pain in left ankle and joints of left foot: Secondary | ICD-10-CM | POA: Diagnosis not present

## 2018-02-03 DIAGNOSIS — M79672 Pain in left foot: Secondary | ICD-10-CM | POA: Diagnosis not present

## 2018-02-03 DIAGNOSIS — M25561 Pain in right knee: Secondary | ICD-10-CM | POA: Diagnosis not present

## 2018-02-06 IMAGING — CT CT ANGIO CHEST
2 of 8 series · 18 of 46 positions shown · IV contrast (OMNI)
Comparison: Chest x-ray obtained earlier today

CLINICAL DATA: 65-year-old male with chest pressure radiating down
the left arm

EXAM:
CT ANGIOGRAPHY CHEST WITH CONTRAST
TECHNIQUE: Multidetector CT imaging of the chest was performed using the
standard protocol during bolus administration of intravenous
contrast. Multiplanar CT image reconstructions and MIPs were
obtained to evaluate the vascular anatomy.
CONTRAST:  80 mL Isovue 370

[Series 5: thins · axial · 0.69mm/px · z∈[+1276,+1555]mm · 15 of 307 slices shown]
[im 14/307  lung]
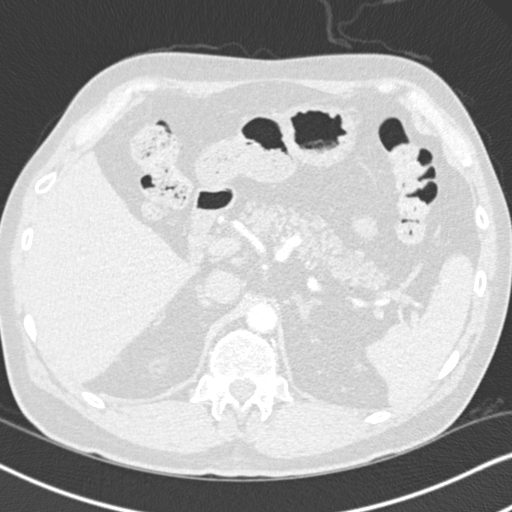
[im 42/307  soft-tissue]
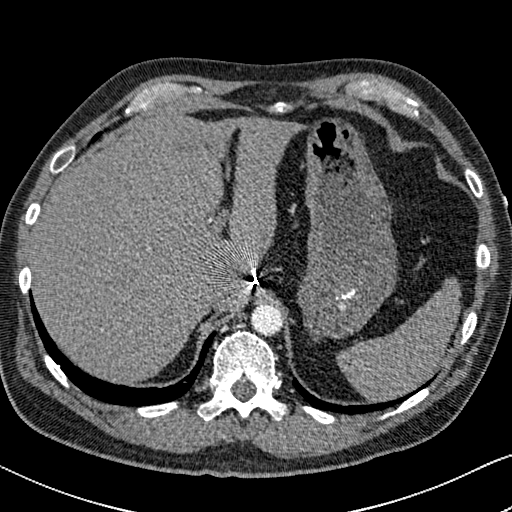
[im 56/307  lung]
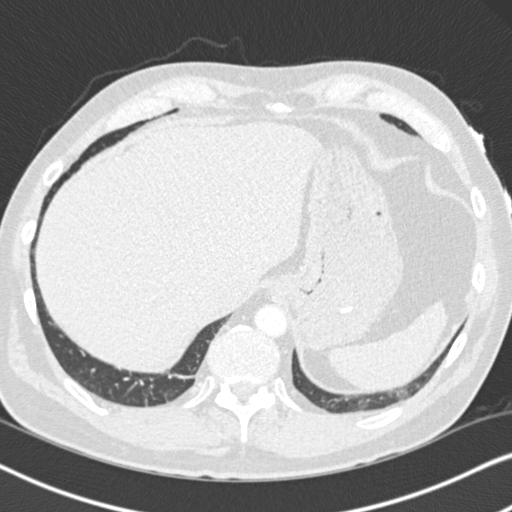
[im 70/307  soft-tissue]
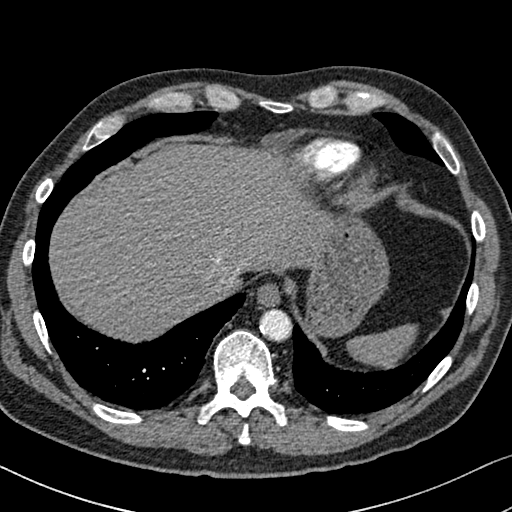
[im 98/307  lung]
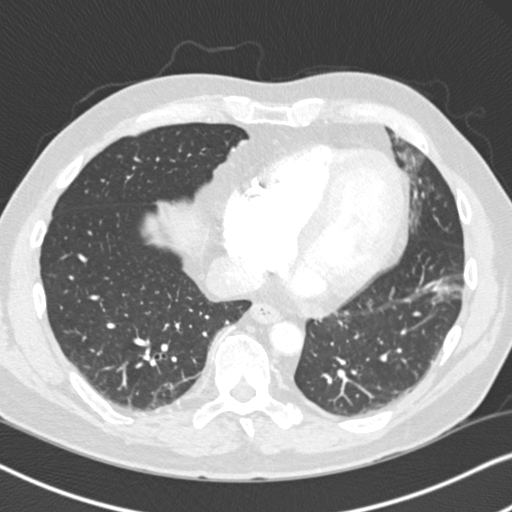
[im 112/307  soft-tissue]
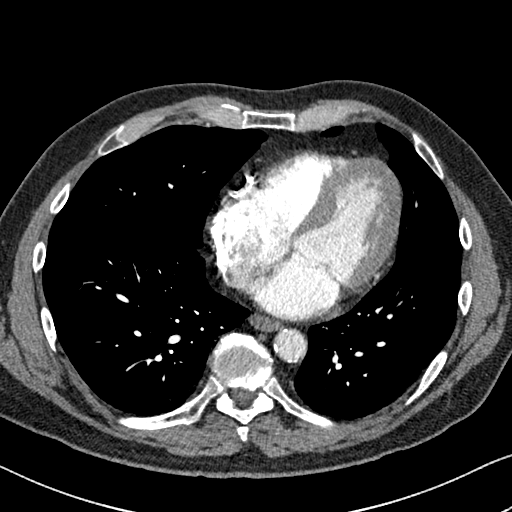
[im 140/307  lung]
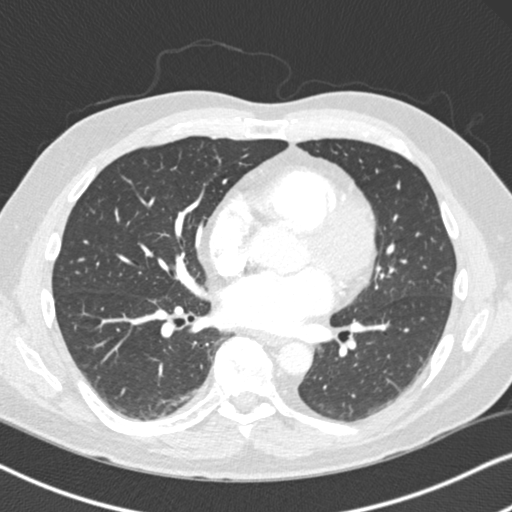
[im 154/307  soft-tissue]
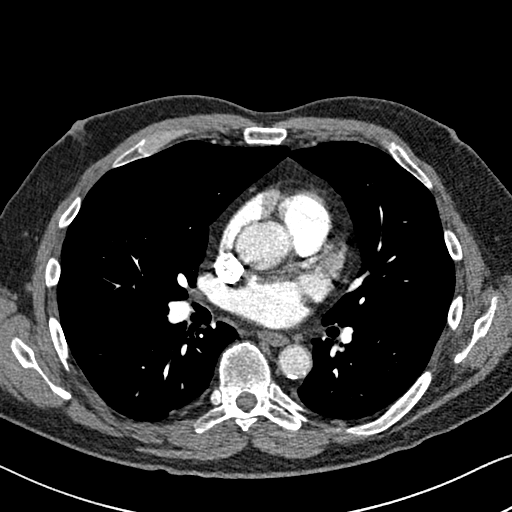
[im 167/307  lung]
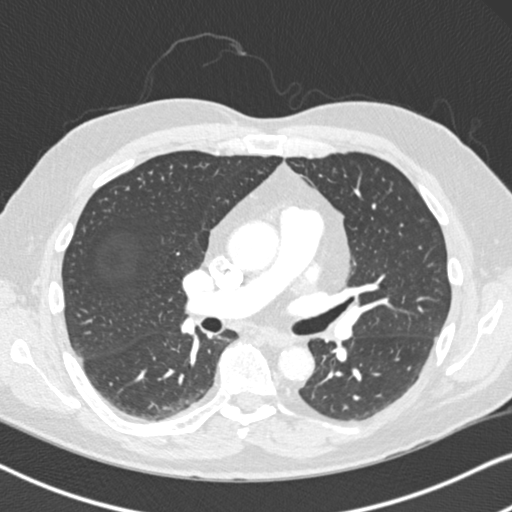
[im 195/307  soft-tissue]
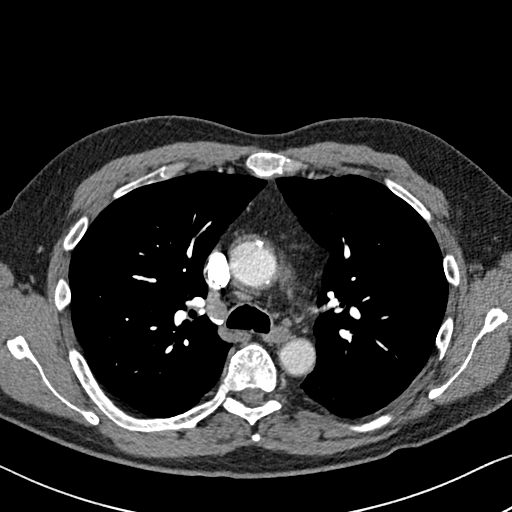
[im 209/307  lung]
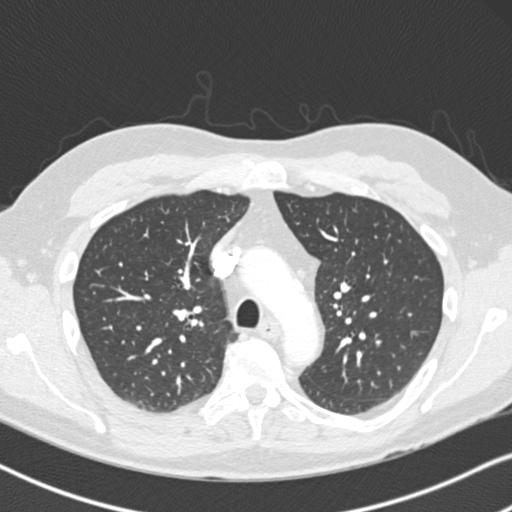
[im 237/307  soft-tissue]
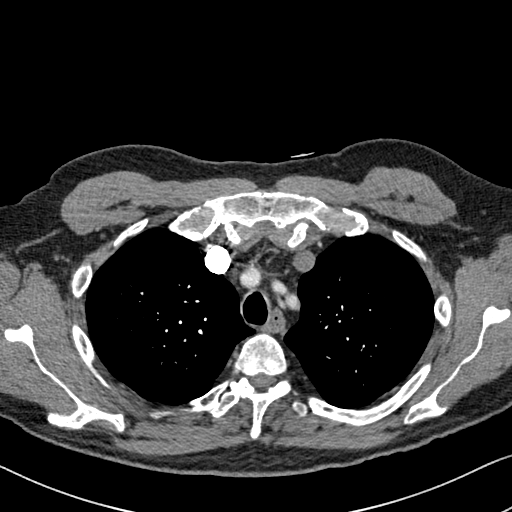
[im 251/307  lung]
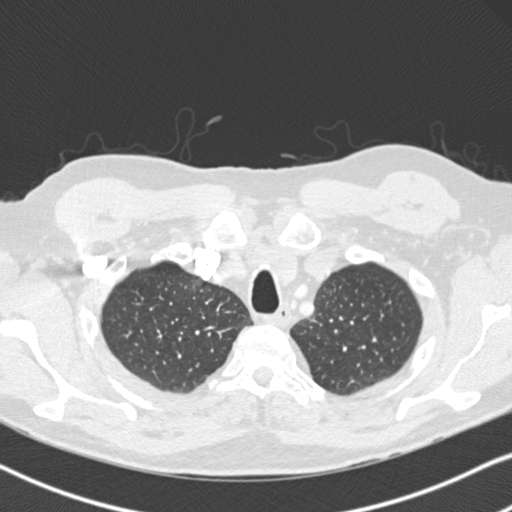
[im 265/307  soft-tissue]
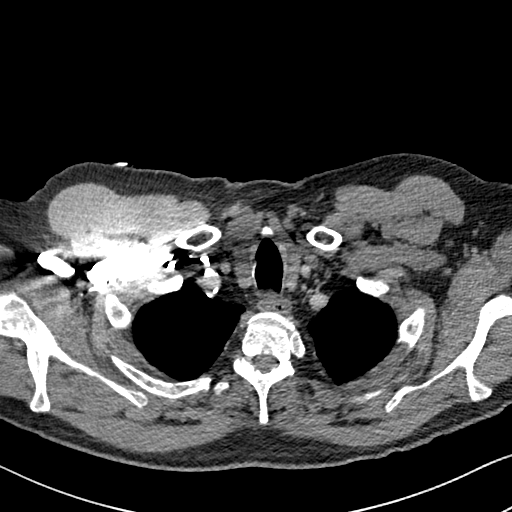
[im 293/307  lung]
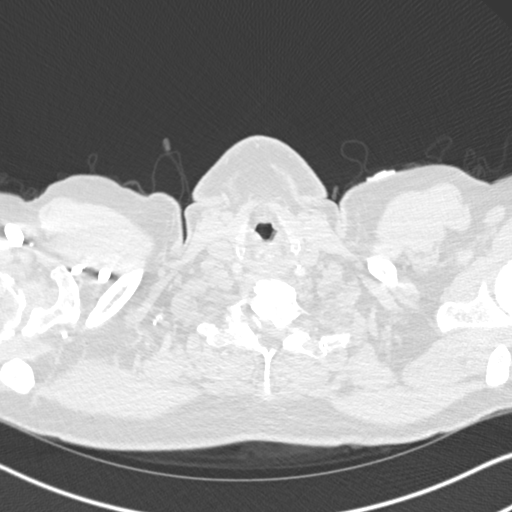

[Series 7: coronal mpr · coronal · 0.62mm/px · 3 of 130 slices shown]
[im 33/130  soft-tissue]
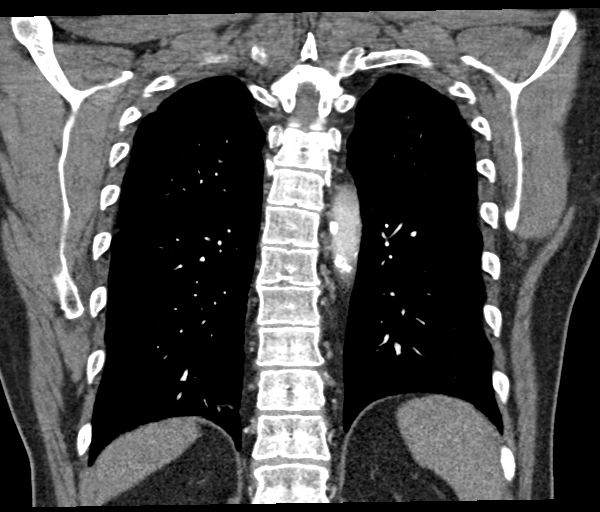
[im 65/130  soft-tissue]
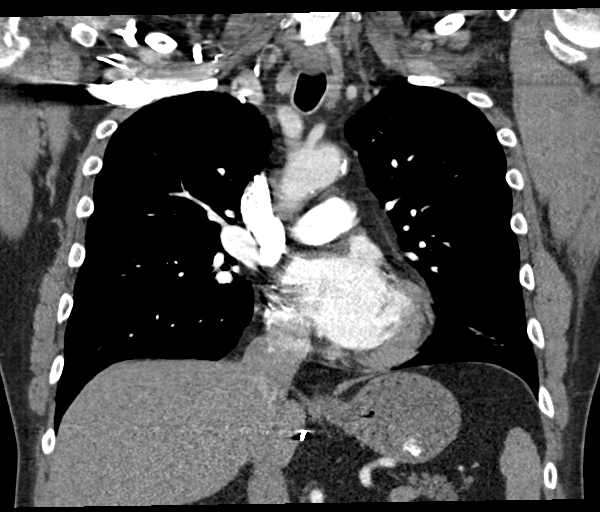
[im 97/130  soft-tissue]
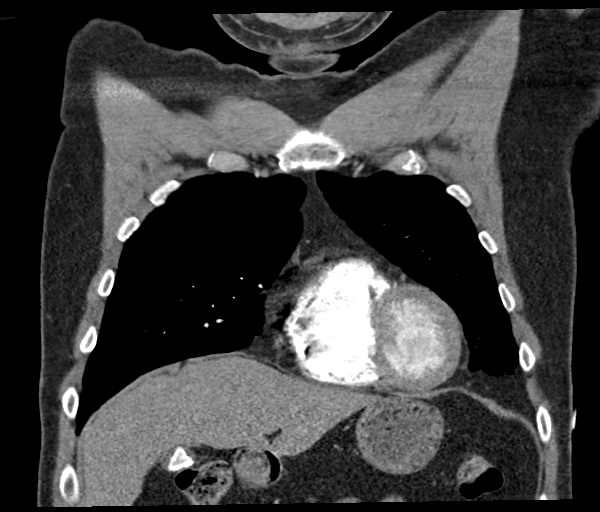

[18 of 46 positions shown; findings below may reference images not displayed]

FINDINGS: Cardiovascular: Adequate opacification of pulmonary arteries to the
proximal subsegmental level. No evidence of acute pulmonary embolus.
Four vessel arch anatomy. The left vertebral artery arises directly
from the aorta. No evidence of aneurysm or dissection.
Calcifications are present throughout the coronary arteries. The
heart is normal in size. No pericardial effusion.

Mediastinum/Nodes: Unremarkable CT appearance of the thyroid gland.
No suspicious mediastinal or hilar adenopathy. No soft tissue
mediastinal mass. The thoracic esophagus is unremarkable.

Lungs/Pleura: Trace dependent atelectasis in the inferior aspect of
the lingula and left lower lobe. Otherwise, the lungs are clear.

Upper Abdomen: Surgical clips present on either side of the caudate
lobe of the liver. Gallstones present within the gallbladder. No
evidence gallbladder distention or wall thickening. The remainder of
the upper abdomen is unremarkable in appearance.

Musculoskeletal: No acute fracture or aggressive appearing lytic or
blastic osseous lesion.

Review of the MIP images confirms the above findings.
IMPRESSION: 1. Negative for acute pulmonary embolus, pneumonia or other acute
cardiopulmonary process.
2. Cholelithiasis.
3. Extensive coronary artery calcifications.

## 2018-02-13 DIAGNOSIS — M9903 Segmental and somatic dysfunction of lumbar region: Secondary | ICD-10-CM | POA: Diagnosis not present

## 2018-02-13 DIAGNOSIS — M5134 Other intervertebral disc degeneration, thoracic region: Secondary | ICD-10-CM | POA: Diagnosis not present

## 2018-02-13 DIAGNOSIS — M9902 Segmental and somatic dysfunction of thoracic region: Secondary | ICD-10-CM | POA: Diagnosis not present

## 2018-02-13 DIAGNOSIS — M5431 Sciatica, right side: Secondary | ICD-10-CM | POA: Diagnosis not present

## 2018-02-13 DIAGNOSIS — M9905 Segmental and somatic dysfunction of pelvic region: Secondary | ICD-10-CM | POA: Diagnosis not present

## 2018-02-13 DIAGNOSIS — M5137 Other intervertebral disc degeneration, lumbosacral region: Secondary | ICD-10-CM | POA: Diagnosis not present

## 2018-03-04 DIAGNOSIS — M5134 Other intervertebral disc degeneration, thoracic region: Secondary | ICD-10-CM | POA: Diagnosis not present

## 2018-03-04 DIAGNOSIS — M5137 Other intervertebral disc degeneration, lumbosacral region: Secondary | ICD-10-CM | POA: Diagnosis not present

## 2018-03-04 DIAGNOSIS — M5431 Sciatica, right side: Secondary | ICD-10-CM | POA: Diagnosis not present

## 2018-03-04 DIAGNOSIS — M9902 Segmental and somatic dysfunction of thoracic region: Secondary | ICD-10-CM | POA: Diagnosis not present

## 2018-03-04 DIAGNOSIS — M9905 Segmental and somatic dysfunction of pelvic region: Secondary | ICD-10-CM | POA: Diagnosis not present

## 2018-03-04 DIAGNOSIS — M9903 Segmental and somatic dysfunction of lumbar region: Secondary | ICD-10-CM | POA: Diagnosis not present

## 2018-03-17 DIAGNOSIS — M25561 Pain in right knee: Secondary | ICD-10-CM | POA: Diagnosis not present

## 2018-03-17 DIAGNOSIS — M79672 Pain in left foot: Secondary | ICD-10-CM | POA: Diagnosis not present

## 2018-04-01 DIAGNOSIS — M5137 Other intervertebral disc degeneration, lumbosacral region: Secondary | ICD-10-CM | POA: Diagnosis not present

## 2018-04-01 DIAGNOSIS — M9902 Segmental and somatic dysfunction of thoracic region: Secondary | ICD-10-CM | POA: Diagnosis not present

## 2018-04-01 DIAGNOSIS — M5134 Other intervertebral disc degeneration, thoracic region: Secondary | ICD-10-CM | POA: Diagnosis not present

## 2018-04-01 DIAGNOSIS — M5431 Sciatica, right side: Secondary | ICD-10-CM | POA: Diagnosis not present

## 2018-04-01 DIAGNOSIS — M9905 Segmental and somatic dysfunction of pelvic region: Secondary | ICD-10-CM | POA: Diagnosis not present

## 2018-04-01 DIAGNOSIS — M9903 Segmental and somatic dysfunction of lumbar region: Secondary | ICD-10-CM | POA: Diagnosis not present

## 2018-04-02 DIAGNOSIS — G4733 Obstructive sleep apnea (adult) (pediatric): Secondary | ICD-10-CM | POA: Diagnosis not present

## 2018-04-20 DIAGNOSIS — M79672 Pain in left foot: Secondary | ICD-10-CM | POA: Diagnosis not present

## 2018-05-11 DIAGNOSIS — M9905 Segmental and somatic dysfunction of pelvic region: Secondary | ICD-10-CM | POA: Diagnosis not present

## 2018-05-11 DIAGNOSIS — M5431 Sciatica, right side: Secondary | ICD-10-CM | POA: Diagnosis not present

## 2018-05-11 DIAGNOSIS — M9903 Segmental and somatic dysfunction of lumbar region: Secondary | ICD-10-CM | POA: Diagnosis not present

## 2018-05-11 DIAGNOSIS — M9902 Segmental and somatic dysfunction of thoracic region: Secondary | ICD-10-CM | POA: Diagnosis not present

## 2018-05-11 DIAGNOSIS — M5134 Other intervertebral disc degeneration, thoracic region: Secondary | ICD-10-CM | POA: Diagnosis not present

## 2018-05-11 DIAGNOSIS — M5137 Other intervertebral disc degeneration, lumbosacral region: Secondary | ICD-10-CM | POA: Diagnosis not present

## 2018-05-26 DIAGNOSIS — J069 Acute upper respiratory infection, unspecified: Secondary | ICD-10-CM | POA: Diagnosis not present

## 2018-06-01 DIAGNOSIS — L814 Other melanin hyperpigmentation: Secondary | ICD-10-CM | POA: Diagnosis not present

## 2018-06-01 DIAGNOSIS — L820 Inflamed seborrheic keratosis: Secondary | ICD-10-CM | POA: Diagnosis not present

## 2018-06-01 DIAGNOSIS — L570 Actinic keratosis: Secondary | ICD-10-CM | POA: Diagnosis not present

## 2018-06-01 DIAGNOSIS — Z85828 Personal history of other malignant neoplasm of skin: Secondary | ICD-10-CM | POA: Diagnosis not present

## 2018-06-01 DIAGNOSIS — D225 Melanocytic nevi of trunk: Secondary | ICD-10-CM | POA: Diagnosis not present

## 2018-06-01 DIAGNOSIS — D692 Other nonthrombocytopenic purpura: Secondary | ICD-10-CM | POA: Diagnosis not present

## 2018-06-01 DIAGNOSIS — D1801 Hemangioma of skin and subcutaneous tissue: Secondary | ICD-10-CM | POA: Diagnosis not present

## 2018-06-15 DIAGNOSIS — M9903 Segmental and somatic dysfunction of lumbar region: Secondary | ICD-10-CM | POA: Diagnosis not present

## 2018-06-15 DIAGNOSIS — M5431 Sciatica, right side: Secondary | ICD-10-CM | POA: Diagnosis not present

## 2018-06-15 DIAGNOSIS — M5134 Other intervertebral disc degeneration, thoracic region: Secondary | ICD-10-CM | POA: Diagnosis not present

## 2018-06-15 DIAGNOSIS — M9905 Segmental and somatic dysfunction of pelvic region: Secondary | ICD-10-CM | POA: Diagnosis not present

## 2018-06-15 DIAGNOSIS — M9902 Segmental and somatic dysfunction of thoracic region: Secondary | ICD-10-CM | POA: Diagnosis not present

## 2018-06-15 DIAGNOSIS — M5137 Other intervertebral disc degeneration, lumbosacral region: Secondary | ICD-10-CM | POA: Diagnosis not present

## 2018-07-03 DIAGNOSIS — G4733 Obstructive sleep apnea (adult) (pediatric): Secondary | ICD-10-CM | POA: Diagnosis not present

## 2018-07-13 DIAGNOSIS — M5431 Sciatica, right side: Secondary | ICD-10-CM | POA: Diagnosis not present

## 2018-07-13 DIAGNOSIS — M9903 Segmental and somatic dysfunction of lumbar region: Secondary | ICD-10-CM | POA: Diagnosis not present

## 2018-07-13 DIAGNOSIS — M9902 Segmental and somatic dysfunction of thoracic region: Secondary | ICD-10-CM | POA: Diagnosis not present

## 2018-07-13 DIAGNOSIS — M9905 Segmental and somatic dysfunction of pelvic region: Secondary | ICD-10-CM | POA: Diagnosis not present

## 2018-07-13 DIAGNOSIS — M5134 Other intervertebral disc degeneration, thoracic region: Secondary | ICD-10-CM | POA: Diagnosis not present

## 2018-07-13 DIAGNOSIS — M5137 Other intervertebral disc degeneration, lumbosacral region: Secondary | ICD-10-CM | POA: Diagnosis not present

## 2018-07-22 DIAGNOSIS — Z96651 Presence of right artificial knee joint: Secondary | ICD-10-CM | POA: Diagnosis not present

## 2018-07-22 DIAGNOSIS — M25861 Other specified joint disorders, right knee: Secondary | ICD-10-CM | POA: Diagnosis not present

## 2018-07-22 DIAGNOSIS — M238X1 Other internal derangements of right knee: Secondary | ICD-10-CM | POA: Diagnosis not present

## 2018-07-31 DIAGNOSIS — Z0000 Encounter for general adult medical examination without abnormal findings: Secondary | ICD-10-CM | POA: Diagnosis not present

## 2018-07-31 DIAGNOSIS — N400 Enlarged prostate without lower urinary tract symptoms: Secondary | ICD-10-CM | POA: Diagnosis not present

## 2018-07-31 DIAGNOSIS — Z1389 Encounter for screening for other disorder: Secondary | ICD-10-CM | POA: Diagnosis not present

## 2018-07-31 DIAGNOSIS — Z8719 Personal history of other diseases of the digestive system: Secondary | ICD-10-CM | POA: Diagnosis not present

## 2018-07-31 DIAGNOSIS — I10 Essential (primary) hypertension: Secondary | ICD-10-CM | POA: Diagnosis not present

## 2018-07-31 DIAGNOSIS — Z125 Encounter for screening for malignant neoplasm of prostate: Secondary | ICD-10-CM | POA: Diagnosis not present

## 2018-07-31 DIAGNOSIS — Z86010 Personal history of colonic polyps: Secondary | ICD-10-CM | POA: Diagnosis not present

## 2018-07-31 DIAGNOSIS — Z1159 Encounter for screening for other viral diseases: Secondary | ICD-10-CM | POA: Diagnosis not present

## 2018-07-31 DIAGNOSIS — I252 Old myocardial infarction: Secondary | ICD-10-CM | POA: Diagnosis not present

## 2018-07-31 DIAGNOSIS — Z8042 Family history of malignant neoplasm of prostate: Secondary | ICD-10-CM | POA: Diagnosis not present

## 2018-07-31 DIAGNOSIS — K219 Gastro-esophageal reflux disease without esophagitis: Secondary | ICD-10-CM | POA: Diagnosis not present

## 2018-08-03 DIAGNOSIS — Z9889 Other specified postprocedural states: Secondary | ICD-10-CM | POA: Diagnosis not present

## 2018-08-03 DIAGNOSIS — M25561 Pain in right knee: Secondary | ICD-10-CM | POA: Diagnosis not present

## 2018-08-05 DIAGNOSIS — H524 Presbyopia: Secondary | ICD-10-CM | POA: Diagnosis not present

## 2018-08-26 DIAGNOSIS — M19079 Primary osteoarthritis, unspecified ankle and foot: Secondary | ICD-10-CM | POA: Diagnosis not present

## 2018-09-03 DIAGNOSIS — M24811 Other specific joint derangements of right shoulder, not elsewhere classified: Secondary | ICD-10-CM | POA: Diagnosis not present

## 2018-09-03 DIAGNOSIS — M19011 Primary osteoarthritis, right shoulder: Secondary | ICD-10-CM | POA: Diagnosis not present

## 2018-10-05 DIAGNOSIS — G4733 Obstructive sleep apnea (adult) (pediatric): Secondary | ICD-10-CM | POA: Diagnosis not present

## 2018-10-08 DIAGNOSIS — M67911 Unspecified disorder of synovium and tendon, right shoulder: Secondary | ICD-10-CM | POA: Diagnosis not present

## 2018-10-08 DIAGNOSIS — M19011 Primary osteoarthritis, right shoulder: Secondary | ICD-10-CM | POA: Diagnosis not present

## 2018-11-17 DIAGNOSIS — M19011 Primary osteoarthritis, right shoulder: Secondary | ICD-10-CM | POA: Diagnosis not present

## 2018-11-17 DIAGNOSIS — M67911 Unspecified disorder of synovium and tendon, right shoulder: Secondary | ICD-10-CM | POA: Diagnosis not present

## 2018-11-25 ENCOUNTER — Encounter: Payer: Self-pay | Admitting: Cardiology

## 2018-11-25 ENCOUNTER — Ambulatory Visit (INDEPENDENT_AMBULATORY_CARE_PROVIDER_SITE_OTHER): Payer: Medicare HMO | Admitting: Cardiology

## 2018-11-25 VITALS — BP 122/68 | HR 68 | Ht 75.0 in | Wt 221.0 lb

## 2018-11-25 DIAGNOSIS — I10 Essential (primary) hypertension: Secondary | ICD-10-CM | POA: Diagnosis not present

## 2018-11-25 DIAGNOSIS — E782 Mixed hyperlipidemia: Secondary | ICD-10-CM | POA: Diagnosis not present

## 2018-11-25 DIAGNOSIS — I2510 Atherosclerotic heart disease of native coronary artery without angina pectoris: Secondary | ICD-10-CM

## 2018-11-25 DIAGNOSIS — G4733 Obstructive sleep apnea (adult) (pediatric): Secondary | ICD-10-CM | POA: Diagnosis not present

## 2018-11-25 MED ORDER — NITROGLYCERIN 0.4 MG SL SUBL: 0.4000 mg | SUBLINGUAL_TABLET | SUBLINGUAL | 11 refills | Status: DC | PRN

## 2018-11-25 NOTE — Progress Notes (Signed)
Cardiology Office Note:    Date:  11/25/2018   ID:  Jacob Parker, DOB 1951/02/10, MRN [id number]  PCP:  Leighton Ruff, MD  Cardiologist:  Jenean Lindau, MD   Referring MD: Leighton Ruff, MD    ASSESSMENT:    1. Coronary artery disease involving native coronary artery of native heart without angina pectoris   2. Essential hypertension   3. Obstructive sleep apnea   4. Mixed hyperlipidemia    PLAN:    In order of problems listed above:  1. Secondary prevention stressed with the patient.  Importance of compliance with diet and medication stressed and he vocalized understanding. 2. His blood pressure is stable.  Lipids are fine and they were recently checked by his primary care physician. 3. Sublingual nitroglycerin prescription was sent, its protocol and 911 protocol explained and the patient vocalized understanding questions were answered to the patient's satisfaction 4. In view of his excellent effort tolerance and exercise capacity it is fair to say that he is not at high risk for coronary events during the aforementioned surgery.  Meticulous hemodynamic monitoring will further reduce the risk of coronary events.   Medication Adjustments/Labs and Tests Ordered: Current medicines are reviewed at length with the patient today.  Concerns regarding medicines are outlined above.  Orders Placed This Encounter  Procedures  . EKG 12-Lead   Meds ordered this encounter  Medications  . nitroGLYCERIN (NITROSTAT) 0.4 MG SL tablet    Sig: Place 1 tablet (0.4 mg total) under the tongue every 5 (five) minutes as needed.    Dispense:  25 tablet    Refill:  11     History of Present Illness:    Jacob Parker is a 67 y.o. male who is being seen today for the evaluation of preoperative cardiovascular assessment in a patient with known coronary artery disease at the request of Leighton Ruff, MD.  Patient is a pleasant 67 year old male.  He has past medical history of  coronary artery disease post coronary stenting.  He has essential hypertension.  He is a patient of Dr. Wynonia Lawman and I am now resuming his care.  Patient mentions to me that he is planning to undergo shoulder surgery and does not know much about those details.  No chest pain orthopnea or PND.  He is an active gentleman and walks about half an hour or more on a regular basis without any problems.  Past Medical History:  Diagnosis Date  . Coronary artery disease 2011   has 2 stents in [place]; as if 10/2014 followed by Dr. Wynonia Lawman in [place]   . DJD (degenerative joint disease)   . GERD (gastroesophageal reflux disease)   . History of skin cancer 1981 / 2015  . Hyperlipidemia   . Hypertension   . Sleep apnea    uses c pap  . Stricture esophagus     Past Surgical History:  Procedure Laterality Date  . APPENDECTOMY    . CARDIAC CATHETERIZATION N/A 12/30/2016   Procedure: Left Heart Cath and Coronary Angiography;  Surgeon: Lorretta Harp, MD;  Location: [facility];  Service: Cardiovascular;  Laterality: N/A;  . CARDIAC CATHETERIZATION N/A 12/30/2016   Procedure: Coronary Stent Intervention;  Surgeon: Lorretta Harp, MD;  Location: [place] [facility];  Service: Cardiovascular;  Laterality: N/A;  . COLONOSCOPY    . CORONARY ANGIOPLASTY WITH STENT PLACEMENT  2011    x 2 stents (Xience RCA and DIAG stent 2011, [place])  . FRACTURE  SURGERY  1985   l foot fx / l leg  . HERNIA REPAIR  1973  . TOTAL KNEE ARTHROPLASTY Left 11/16/2014   Procedure: LEFT TOTAL KNEE ARTHROPLASTY;  Surgeon: Alta Corning, MD;  Location[facility];  Service: Orthopedics;  Laterality: Left;  . TOTAL KNEE ARTHROPLASTY Right 01/23/2015   Procedure: RIGHT TOTAL KNEE ARTHROPLASTY;  Surgeon: Alta Corning, MD;  Location: [facility];  Service: Orthopedics;  Laterality: Right;  . UPPER GI ENDOSCOPY    . VASECTOMY      Current Medications: Current Meds  Medication Sig  . acetaminophen (TYLENOL) 325 MG tablet Take 2 tablets (650 mg  total) by mouth every 4 (four) hours as needed for headache or mild pain.  Marland Kitchen aspirin EC 81 MG tablet Take 81 mg by mouth daily.  Marland Kitchen esomeprazole (NEXIUM) 20 MG capsule Take 20 mg by mouth daily before breakfast.  . lisinopril (PRINIVIL,ZESTRIL) 20 MG tablet Take 20 mg by mouth at bedtime.  . Multiple Vitamin (MULTIVITAMIN WITH MINERALS) TABS tablet Take 1 tablet by mouth daily.  . nebivolol (BYSTOLIC) 5 MG tablet Take 5 mg by mouth daily.   . Omega-3 Fatty Acids (FISH OIL) 1000 MG CAPS Take 1,000 mg by mouth daily.  . rosuvastatin (CRESTOR) 40 MG tablet Take 40 mg by mouth at bedtime.      Allergies:   Patient has no known allergies.   Social History   Socioeconomic History  . Marital status: Married    Spouse name: Not on file  . Number of children: Not on file  . Years of education: Not on file  . Highest education level: Not on file  Occupational History  . Not on file  Social Needs  . Financial resource strain: Not on file  . Food insecurity:    Worry: Not on file    Inability: Not on file  . Transportation needs:    Medical: Not on file    Non-medical: Not on file  Tobacco Use  . Smoking status: Never Smoker  . Smokeless tobacco: Never Used  Substance and Sexual Activity  . Alcohol use: Yes    Alcohol/week: 3.0 standard drinks    Types: 3 Standard drinks or equivalent per week    Comment: occasional  . Drug use: No  . Sexual activity: Yes  Lifestyle  . Physical activity:    Days per week: Not on file    Minutes per session: Not on file  . Stress: Not on file  Relationships  . Social connections:    Talks on phone: Not on file    Gets together: Not on file    Attends religious service: Not on file    Active member of club or organization: Not on file    Attends meetings of clubs or organizations: Not on file    Relationship status: Not on file  Other Topics Concern  . Not on file  Social History Narrative  . Not on file     Family History: The patient's  family history includes Alzheimer's disease in his mother; CAD in his mother.  ROS:   Please see the history of present illness.    All other systems reviewed and are negative.  EKGs/Labs/Other Studies Reviewed:    The following studies were reviewed today: EKG reveals sinus rhythm and nonspecific ST-T changes.   Recent Labs: No results found for requested labs within last 8760 hours.  Recent Lipid Panel    Component Value Date/Time   CHOL  156 12/28/2016 1630   TRIG 150 (H) 12/28/2016 1630   HDL 47 12/28/2016 1630   CHOLHDL 3.3 12/28/2016 1630   VLDL 30 12/28/2016 1630   LDLCALC 79 12/28/2016 1630    Physical Exam:    VS:  BP 122/68 (BP Location: Right Arm, Patient Position: Sitting, Cuff Size: Normal)   Pulse 68   Ht 6\' 3"  (1.905 m)   Wt 221 lb (100.2 kg)   SpO2 94%   BMI 27.62 kg/m     Wt Readings from Last 3 Encounters:  11/25/18 221 lb (100.2 kg)  12/28/16 211 lb 3.2 oz (95.8 kg)  01/23/15 194 lb 14 oz (88.4 kg)     GEN: Patient is in no acute distress HEENT: Normal NECK: No JVD; No carotid bruits LYMPHATICS: No lymphadenopathy CARDIAC: S1 S2 regular, 2/6 systolic murmur at the apex. RESPIRATORY:  Clear to auscultation without rales, wheezing or rhonchi  ABDOMEN: Soft, non-tender, non-distended MUSCULOSKELETAL:  No edema; No deformity  SKIN: Warm and dry NEUROLOGIC:  Alert and oriented x 3 PSYCHIATRIC:  Normal affect    Signed, Jenean Lindau, MD  11/25/2018 12:43 PM    [facility]

## 2018-11-25 NOTE — Patient Instructions (Signed)
Medication Instructions:  Your physician has recommended you make the following change in your medication:   START Nitroglycerin 0.4 mg sublingual (under your tongue) as needed for chest pain. If experiencing chest pain, stop what you are doing and sit down. Take 1 nitroglycerin and wait 5 minutes. If chest pain continues, take another nitroglycerin and wait 5 minutes. If chest pain does not subside, take 1 more nitroglycerin and dial 911. You make take a total of 3 nitroglycerin in a 15 minute time frame.  If you need a refill on your cardiac medications before your next appointment, please call your pharmacy.   Lab work: None  If you have labs (blood work) drawn today and your tests are completely normal, you will receive your results only by: Marland Kitchen MyChart Message (if you have MyChart) OR . A paper copy in the mail If you have any lab test that is abnormal or we need to change your treatment, we will call you to review the results.  Testing/Procedures: None  Follow-Up: At [facility], you and your health needs are our priority.  As part of our continuing mission to provide you with exceptional heart care, we have created designated Provider Care Teams.  These Care Teams include your primary Cardiologist (physician) and Advanced Practice Providers (APPs -  Physician Assistants and Nurse Practitioners) who all work together to provide you with the care you need, when you need it.  You will need a follow up appointment in 6 months.  Please call our office 2 months in advance to schedule this appointment.  You may see another member of our [employer] Provider Team in [place]: Jenne Campus, MD . Shirlee More, MD  Any Other Special Instructions Will Be Listed Below (If Applicable).

## 2018-12-29 ENCOUNTER — Telehealth: Payer: Self-pay | Admitting: Cardiology

## 2018-12-29 NOTE — Telephone Encounter (Signed)
Jacob Parker needs a phone call regarding the clearance for his surgery 12/30/2018. He was seen by RRR 11/25/18

## 2018-12-29 NOTE — Telephone Encounter (Signed)
Has been given to Dr. Revankar for him to review  

## 2018-12-30 DIAGNOSIS — M75111 Incomplete rotator cuff tear or rupture of right shoulder, not specified as traumatic: Secondary | ICD-10-CM | POA: Diagnosis not present

## 2018-12-30 DIAGNOSIS — M7521 Bicipital tendinitis, right shoulder: Secondary | ICD-10-CM | POA: Diagnosis not present

## 2018-12-30 DIAGNOSIS — M7541 Impingement syndrome of right shoulder: Secondary | ICD-10-CM | POA: Diagnosis not present

## 2018-12-30 DIAGNOSIS — M94211 Chondromalacia, right shoulder: Secondary | ICD-10-CM | POA: Diagnosis not present

## 2018-12-30 DIAGNOSIS — G8918 Other acute postprocedural pain: Secondary | ICD-10-CM | POA: Diagnosis not present

## 2018-12-30 DIAGNOSIS — M19011 Primary osteoarthritis, right shoulder: Secondary | ICD-10-CM | POA: Diagnosis not present

## 2018-12-30 NOTE — Telephone Encounter (Signed)
Patient has been cleared and this has been faxed to [facility]

## 2019-01-01 DIAGNOSIS — M25611 Stiffness of right shoulder, not elsewhere classified: Secondary | ICD-10-CM | POA: Diagnosis not present

## 2019-01-05 DIAGNOSIS — G4733 Obstructive sleep apnea (adult) (pediatric): Secondary | ICD-10-CM | POA: Diagnosis not present

## 2019-01-05 DIAGNOSIS — M25611 Stiffness of right shoulder, not elsewhere classified: Secondary | ICD-10-CM | POA: Diagnosis not present

## 2019-01-05 DIAGNOSIS — Z9889 Other specified postprocedural states: Secondary | ICD-10-CM | POA: Diagnosis not present

## 2019-01-08 DIAGNOSIS — M25611 Stiffness of right shoulder, not elsewhere classified: Secondary | ICD-10-CM | POA: Diagnosis not present

## 2019-01-12 DIAGNOSIS — M25611 Stiffness of right shoulder, not elsewhere classified: Secondary | ICD-10-CM | POA: Diagnosis not present

## 2019-01-14 DIAGNOSIS — M25611 Stiffness of right shoulder, not elsewhere classified: Secondary | ICD-10-CM | POA: Diagnosis not present

## 2019-01-15 DIAGNOSIS — M25611 Stiffness of right shoulder, not elsewhere classified: Secondary | ICD-10-CM | POA: Diagnosis not present

## 2019-01-18 DIAGNOSIS — M25611 Stiffness of right shoulder, not elsewhere classified: Secondary | ICD-10-CM | POA: Diagnosis not present

## 2019-01-20 DIAGNOSIS — M25611 Stiffness of right shoulder, not elsewhere classified: Secondary | ICD-10-CM | POA: Diagnosis not present

## 2019-01-22 DIAGNOSIS — M25611 Stiffness of right shoulder, not elsewhere classified: Secondary | ICD-10-CM | POA: Diagnosis not present

## 2019-01-26 DIAGNOSIS — M7521 Bicipital tendinitis, right shoulder: Secondary | ICD-10-CM | POA: Diagnosis not present

## 2019-01-26 DIAGNOSIS — M19011 Primary osteoarthritis, right shoulder: Secondary | ICD-10-CM | POA: Diagnosis not present

## 2019-01-26 DIAGNOSIS — Z9889 Other specified postprocedural states: Secondary | ICD-10-CM | POA: Diagnosis not present

## 2019-01-27 DIAGNOSIS — S86812D Strain of other muscle(s) and tendon(s) at lower leg level, left leg, subsequent encounter: Secondary | ICD-10-CM | POA: Diagnosis not present

## 2019-01-29 DIAGNOSIS — S86812D Strain of other muscle(s) and tendon(s) at lower leg level, left leg, subsequent encounter: Secondary | ICD-10-CM | POA: Diagnosis not present

## 2019-02-01 DIAGNOSIS — N400 Enlarged prostate without lower urinary tract symptoms: Secondary | ICD-10-CM | POA: Diagnosis not present

## 2019-02-01 DIAGNOSIS — K219 Gastro-esophageal reflux disease without esophagitis: Secondary | ICD-10-CM | POA: Diagnosis not present

## 2019-02-01 DIAGNOSIS — Z23 Encounter for immunization: Secondary | ICD-10-CM | POA: Diagnosis not present

## 2019-02-01 DIAGNOSIS — I10 Essential (primary) hypertension: Secondary | ICD-10-CM | POA: Diagnosis not present

## 2019-02-01 DIAGNOSIS — I252 Old myocardial infarction: Secondary | ICD-10-CM | POA: Diagnosis not present

## 2019-02-01 DIAGNOSIS — I2510 Atherosclerotic heart disease of native coronary artery without angina pectoris: Secondary | ICD-10-CM | POA: Diagnosis not present

## 2019-02-01 DIAGNOSIS — E781 Pure hyperglyceridemia: Secondary | ICD-10-CM | POA: Diagnosis not present

## 2019-02-02 DIAGNOSIS — S86812D Strain of other muscle(s) and tendon(s) at lower leg level, left leg, subsequent encounter: Secondary | ICD-10-CM | POA: Diagnosis not present

## 2019-02-03 DIAGNOSIS — S86812D Strain of other muscle(s) and tendon(s) at lower leg level, left leg, subsequent encounter: Secondary | ICD-10-CM | POA: Diagnosis not present

## 2019-02-04 DIAGNOSIS — S86812D Strain of other muscle(s) and tendon(s) at lower leg level, left leg, subsequent encounter: Secondary | ICD-10-CM | POA: Diagnosis not present

## 2019-02-08 DIAGNOSIS — S86812D Strain of other muscle(s) and tendon(s) at lower leg level, left leg, subsequent encounter: Secondary | ICD-10-CM | POA: Diagnosis not present

## 2019-02-10 DIAGNOSIS — S86812D Strain of other muscle(s) and tendon(s) at lower leg level, left leg, subsequent encounter: Secondary | ICD-10-CM | POA: Diagnosis not present

## 2019-02-11 DIAGNOSIS — S86812D Strain of other muscle(s) and tendon(s) at lower leg level, left leg, subsequent encounter: Secondary | ICD-10-CM | POA: Diagnosis not present

## 2019-03-15 ENCOUNTER — Telehealth: Payer: Self-pay | Admitting: Cardiology

## 2019-03-15 NOTE — Telephone Encounter (Signed)
Patient needs refills for the following meds sent to [facility]. Fax numer for them is [phone] Meds are Rosuvastatin, Lisinpril, and Bystolic

## 2019-03-16 ENCOUNTER — Other Ambulatory Visit: Payer: Self-pay

## 2019-03-16 MED ORDER — NEBIVOLOL HCL 5 MG PO TABS: 5.0000 mg | ORAL_TABLET | Freq: Every day | ORAL | 2 refills | Status: DC

## 2019-03-16 MED ORDER — ROSUVASTATIN CALCIUM 40 MG PO TABS: 40.0000 mg | ORAL_TABLET | Freq: Every day | ORAL | 1 refills | Status: DC

## 2019-03-16 MED ORDER — LISINOPRIL 20 MG PO TABS: 20.0000 mg | ORAL_TABLET | Freq: Every day | ORAL | 1 refills | Status: DC

## 2019-03-16 NOTE — Telephone Encounter (Signed)
Refill sent task complete. 

## 2019-04-01 ENCOUNTER — Telehealth: Payer: Self-pay | Admitting: Cardiology

## 2019-04-01 NOTE — Telephone Encounter (Signed)
Patient called with cough, scratchy throat, and sneezing but no fever. He states he washed his vehicle and it was covered with pollen so thinking allergy. Can he take any allergy meds?

## 2019-04-02 NOTE — Telephone Encounter (Signed)
Patient called back with concerns about taking allergy medications and hx of stents. RN sent 2nd message to Dr.RRR for advisement.

## 2019-04-02 NOTE — Telephone Encounter (Signed)
Per Dr.RRR advice patient will start OTC allergy medication zyrtec, allegra or claritin. Pt instructed to try and stay well hydrated also. No further questions at this time.

## 2019-04-05 DIAGNOSIS — J302 Other seasonal allergic rhinitis: Secondary | ICD-10-CM | POA: Diagnosis not present

## 2019-04-06 DIAGNOSIS — G4733 Obstructive sleep apnea (adult) (pediatric): Secondary | ICD-10-CM | POA: Diagnosis not present

## 2019-07-03 ENCOUNTER — Other Ambulatory Visit: Payer: Self-pay | Admitting: Cardiology

## 2019-07-06 DIAGNOSIS — G4733 Obstructive sleep apnea (adult) (pediatric): Secondary | ICD-10-CM | POA: Diagnosis not present

## 2019-08-05 DIAGNOSIS — Z0000 Encounter for general adult medical examination without abnormal findings: Secondary | ICD-10-CM | POA: Diagnosis not present

## 2019-08-05 DIAGNOSIS — Z6830 Body mass index (BMI) 30.0-30.9, adult: Secondary | ICD-10-CM | POA: Diagnosis not present

## 2019-09-02 ENCOUNTER — Other Ambulatory Visit: Payer: Self-pay

## 2019-09-02 ENCOUNTER — Telehealth: Payer: Self-pay | Admitting: Cardiology

## 2019-09-02 MED ORDER — NEBIVOLOL HCL 5 MG PO TABS: 5.0000 mg | ORAL_TABLET | Freq: Every day | ORAL | 0 refills | Status: DC

## 2019-09-02 NOTE — Telephone Encounter (Signed)
°*  STAT* If patient is at the pharmacy, call can be transferred to refill team.   1. Which medications need to be refilled? (please list name of each medication and dose if known) Bystolic  2. Which pharmacy/location (including street and city if local pharmacy) is medication to be sent to?[facility] at [address]  3. Do they need a 30 day or 90 day supply? [place]

## 2019-09-20 DIAGNOSIS — Z1159 Encounter for screening for other viral diseases: Secondary | ICD-10-CM | POA: Diagnosis not present

## 2019-09-23 DIAGNOSIS — Z86010 Personal history of colonic polyps: Secondary | ICD-10-CM | POA: Diagnosis not present

## 2019-09-23 DIAGNOSIS — K640 First degree hemorrhoids: Secondary | ICD-10-CM | POA: Diagnosis not present

## 2019-10-02 DIAGNOSIS — F17210 Nicotine dependence, cigarettes, uncomplicated: Secondary | ICD-10-CM | POA: Diagnosis not present

## 2019-10-02 DIAGNOSIS — Z6829 Body mass index (BMI) 29.0-29.9, adult: Secondary | ICD-10-CM | POA: Diagnosis not present

## 2019-10-02 DIAGNOSIS — M62838 Other muscle spasm: Secondary | ICD-10-CM | POA: Diagnosis not present

## 2019-10-02 DIAGNOSIS — Z7189 Other specified counseling: Secondary | ICD-10-CM | POA: Diagnosis not present

## 2019-10-04 DIAGNOSIS — M5022 Other cervical disc displacement, mid-cervical region, unspecified level: Secondary | ICD-10-CM | POA: Diagnosis not present

## 2019-10-04 DIAGNOSIS — M5126 Other intervertebral disc displacement, lumbar region: Secondary | ICD-10-CM | POA: Diagnosis not present

## 2019-10-04 DIAGNOSIS — M9902 Segmental and somatic dysfunction of thoracic region: Secondary | ICD-10-CM | POA: Diagnosis not present

## 2019-10-04 DIAGNOSIS — M9903 Segmental and somatic dysfunction of lumbar region: Secondary | ICD-10-CM | POA: Diagnosis not present

## 2019-10-04 DIAGNOSIS — M5124 Other intervertebral disc displacement, thoracic region: Secondary | ICD-10-CM | POA: Diagnosis not present

## 2019-10-04 DIAGNOSIS — M9901 Segmental and somatic dysfunction of cervical region: Secondary | ICD-10-CM | POA: Diagnosis not present

## 2019-10-06 ENCOUNTER — Other Ambulatory Visit: Payer: Self-pay | Admitting: Cardiology

## 2019-10-06 DIAGNOSIS — G4733 Obstructive sleep apnea (adult) (pediatric): Secondary | ICD-10-CM | POA: Diagnosis not present

## 2019-10-15 ENCOUNTER — Telehealth: Payer: Self-pay | Admitting: Cardiology

## 2019-10-15 MED ORDER — NEBIVOLOL HCL 5 MG PO TABS: ORAL_TABLET | ORAL | 0 refills | Status: DC

## 2019-10-15 NOTE — Telephone Encounter (Signed)
°  Pt has appt 10-27   1. Which medications need to be refilled? (please list name of each medication and dose if known) Bystolic 5mg  tab  2. Which pharmacy/location (including street and city if local pharmacy) is medication to be sent to?[facility]  3. Do they need a 30 day or 90 day supply? [place]

## 2019-10-19 ENCOUNTER — Other Ambulatory Visit: Payer: Self-pay

## 2019-10-19 ENCOUNTER — Ambulatory Visit (INDEPENDENT_AMBULATORY_CARE_PROVIDER_SITE_OTHER): Payer: Medicare HMO | Admitting: Cardiology

## 2019-10-19 ENCOUNTER — Encounter: Payer: Self-pay | Admitting: Cardiology

## 2019-10-19 VITALS — BP 104/66 | HR 77 | Ht 75.0 in | Wt 224.1 lb

## 2019-10-19 DIAGNOSIS — Z1329 Encounter for screening for other suspected endocrine disorder: Secondary | ICD-10-CM | POA: Diagnosis not present

## 2019-10-19 DIAGNOSIS — I10 Essential (primary) hypertension: Secondary | ICD-10-CM | POA: Diagnosis not present

## 2019-10-19 DIAGNOSIS — I2510 Atherosclerotic heart disease of native coronary artery without angina pectoris: Secondary | ICD-10-CM

## 2019-10-19 DIAGNOSIS — E782 Mixed hyperlipidemia: Secondary | ICD-10-CM

## 2019-10-19 NOTE — Patient Instructions (Signed)
Medication Instructions:  Your physician recommends that you continue on your current medications as directed. Please refer to the Current Medication list given to you today.  *If you need a refill on your cardiac medications before your next appointment, please call your pharmacy*  Lab Work: Your physician recommends that you return FASTING for a CBC, BMP, TSH, Hepatic and lipid to be drawn.  If you have labs (blood work) drawn today and your tests are completely normal, you will receive your results only by: Marland Kitchen MyChart Message (if you have MyChart) OR . A paper copy in the mail If you have any lab test that is abnormal or we need to change your treatment, we will call you to review the results.  Testing/Procedures: NONE  Follow-Up: At [facility], you and your health needs are our priority.  As part of our continuing mission to provide you with exceptional heart care, we have created designated Provider Care Teams.  These Care Teams include your primary Cardiologist (physician) and Advanced Practice Providers (APPs -  Physician Assistants and Nurse Practitioners) who all work together to provide you with the care you need, when you need it.  Your next appointment:   6 months  The format for your next appointment:   In Person  Provider:   Jyl Heinz, MD

## 2019-10-19 NOTE — Progress Notes (Signed)
Cardiology Office Note:    Date:  10/19/2019   ID:  Jacob Parker, DOB 1951-09-22, MRN [id number]  PCP:  Leighton Ruff, MD  Cardiologist:  Jenean Lindau, MD   Referring MD: Leighton Ruff, MD    ASSESSMENT:    1. Coronary artery disease involving native coronary artery of native heart without angina pectoris   2. Essential hypertension   3. Mixed hyperlipidemia    PLAN:    In order of problems listed above:  1. Coronary artery disease: Secondary prevention stressed with the patient.  Importance of compliance with diet and medication stressed and he vocalized understanding. 2. Essential hypertension: Blood pressure is stable and diet was discussed including salt intake issues. 3. Mixed dyslipidemia: Diet was discussed and he will be back in the next few days for blood work including fasting lipids. 4. Patient will be seen in follow-up appointment in 6 months or earlier if the patient has any concerns    Medication Adjustments/Labs and Tests Ordered: Current medicines are reviewed at length with the patient today.  Concerns regarding medicines are outlined above.  No orders of the defined types were placed in this encounter.  No orders of the defined types were placed in this encounter.    No chief complaint on file.    History of Present Illness:    Jacob Parker is a 68 y.o. male.  Patient has known coronary artery disease.  He denies any problems at this time and takes care of activities of daily living.  No chest pain orthopnea or PND.  At the time of my evaluation, the patient is alert awake oriented and in no distress.  Past Medical History:  Diagnosis Date  . Coronary artery disease 2011   has 2 stents in [place]; as if 10/2014 followed by Dr. Wynonia Lawman in [place]   . DJD (degenerative joint disease)   . GERD (gastroesophageal reflux disease)   . History of skin cancer 1981 / 2015  . Hyperlipidemia   . Hypertension   . Sleep apnea    uses c pap   . Stricture esophagus     Past Surgical History:  Procedure Laterality Date  . APPENDECTOMY    . CARDIAC CATHETERIZATION N/A 12/30/2016   Procedure: Left Heart Cath and Coronary Angiography;  Surgeon: Lorretta Harp, MD;  Location: [place] [facility];  Service: Cardiovascular;  Laterality: N/A;  . CARDIAC CATHETERIZATION N/A 12/30/2016   Procedure: Coronary Stent Intervention;  Surgeon: Lorretta Harp, MD;  Location: [place] [facility];  Service: Cardiovascular;  Laterality: N/A;  . COLONOSCOPY    . CORONARY ANGIOPLASTY WITH STENT PLACEMENT  2011    x 2 stents (Xience RCA and DIAG stent 2011, [place])  . FRACTURE SURGERY  1985   l foot fx / l leg  . HERNIA REPAIR  1973  . TOTAL KNEE ARTHROPLASTY Left 11/16/2014   Procedure: LEFT TOTAL KNEE ARTHROPLASTY;  Surgeon: Alta Corning, MD;  Location: [facility];  Service: Orthopedics;  Laterality: Left;  . TOTAL KNEE ARTHROPLASTY Right 01/23/2015   Procedure: RIGHT TOTAL KNEE ARTHROPLASTY;  Surgeon: Alta Corning, MD;  Location: [place];  Service: Orthopedics;  Laterality: Right;  . UPPER GI ENDOSCOPY    . VASECTOMY      Current Medications: Current Meds  Medication Sig  . acetaminophen (TYLENOL) 325 MG tablet Take 2 tablets (650 mg total) by mouth every 4 (four) hours as needed for headache or mild pain.  Marland Kitchen aspirin EC  81 MG tablet Take 81 mg by mouth daily.  Marland Kitchen esomeprazole (NEXIUM) 20 MG capsule Take 20 mg by mouth daily before breakfast.  . lisinopril (ZESTRIL) 20 MG tablet TAKE 1 TABLET AT BEDTIME  . Multiple Vitamin (MULTIVITAMIN WITH MINERALS) TABS tablet Take 1 tablet by mouth daily.  . nebivolol (BYSTOLIC) 5 MG tablet TAKE 1 TABLET EVERY DAY  . Omega-3 Fatty Acids (FISH OIL) 1000 MG CAPS Take 1,000 mg by mouth daily.  . rosuvastatin (CRESTOR) 40 MG tablet TAKE 1 TABLET AT BEDTIME     Allergies:   Patient has no known allergies.   Social History   Socioeconomic History  . Marital status: Married    Spouse name: Not on file  .  Number of children: Not on file  . Years of education: Not on file  . Highest education level: Not on file  Occupational History  . Not on file  Social Needs  . Financial resource strain: Not on file  . Food insecurity    Worry: Not on file    Inability: Not on file  . Transportation needs    Medical: Not on file    Non-medical: Not on file  Tobacco Use  . Smoking status: Never Smoker  . Smokeless tobacco: Never Used  Substance and Sexual Activity  . Alcohol use: Yes    Alcohol/week: 3.0 standard drinks    Types: 3 Standard drinks or equivalent per week    Comment: occasional  . Drug use: No  . Sexual activity: Yes  Lifestyle  . Physical activity    Days per week: Not on file    Minutes per session: Not on file  . Stress: Not on file  Relationships  . Social Herbalist on phone: Not on file    Gets together: Not on file    Attends religious service: Not on file    Active member of club or organization: Not on file    Attends meetings of clubs or organizations: Not on file    Relationship status: Not on file  Other Topics Concern  . Not on file  Social History Narrative  . Not on file     Family History: The patient's family history includes Alzheimer's disease in his mother; CAD in his mother.  ROS:   Please see the history of present illness.    All other systems reviewed and are negative.  EKGs/Labs/Other Studies Reviewed:    The following studies were reviewed today: I discussed my findings with the patient at length.   Recent Labs: No results found for requested labs within last 8760 hours.  Recent Lipid Panel    Component Value Date/Time   CHOL 156 12/28/2016 1630   TRIG 150 (H) 12/28/2016 1630   HDL 47 12/28/2016 1630   CHOLHDL 3.3 12/28/2016 1630   VLDL 30 12/28/2016 1630   LDLCALC 79 12/28/2016 1630    Physical Exam:    VS:  BP 104/66 (BP Location: Left Arm, Patient Position: Sitting, Cuff Size: Normal)   Pulse 77   Ht 6\' 3"   (1.905 m)   Wt 224 lb 1.3 oz (101.6 kg)   SpO2 96%   BMI 28.01 kg/m     Wt Readings from Last 3 Encounters:  10/19/19 224 lb 1.3 oz (101.6 kg)  11/25/18 221 lb (100.2 kg)  12/28/16 211 lb 3.2 oz (95.8 kg)     GEN: Patient is in no acute distress HEENT: Normal NECK: No JVD; No carotid  bruits LYMPHATICS: No lymphadenopathy CARDIAC: Hear sounds regular, 2/6 systolic murmur at the apex. RESPIRATORY:  Clear to auscultation without rales, wheezing or rhonchi  ABDOMEN: Soft, non-tender, non-distended MUSCULOSKELETAL:  No edema; No deformity  SKIN: Warm and dry NEUROLOGIC:  Alert and oriented x 3 PSYCHIATRIC:  Normal affect   Signed, Jenean Lindau, MD  10/19/2019 11:05 AM    [place]

## 2019-10-19 NOTE — Addendum Note (Signed)
Addended by: Beckey Rutter on: 10/19/2019 11:21 AM   Modules accepted: Orders

## 2019-11-01 DIAGNOSIS — M5022 Other cervical disc displacement, mid-cervical region, unspecified level: Secondary | ICD-10-CM | POA: Diagnosis not present

## 2019-11-01 DIAGNOSIS — M9901 Segmental and somatic dysfunction of cervical region: Secondary | ICD-10-CM | POA: Diagnosis not present

## 2019-11-01 DIAGNOSIS — M9903 Segmental and somatic dysfunction of lumbar region: Secondary | ICD-10-CM | POA: Diagnosis not present

## 2019-11-01 DIAGNOSIS — M5124 Other intervertebral disc displacement, thoracic region: Secondary | ICD-10-CM | POA: Diagnosis not present

## 2019-11-01 DIAGNOSIS — M9902 Segmental and somatic dysfunction of thoracic region: Secondary | ICD-10-CM | POA: Diagnosis not present

## 2019-11-01 DIAGNOSIS — M5126 Other intervertebral disc displacement, lumbar region: Secondary | ICD-10-CM | POA: Diagnosis not present

## 2019-11-02 DIAGNOSIS — Z23 Encounter for immunization: Secondary | ICD-10-CM | POA: Diagnosis not present

## 2019-11-10 DIAGNOSIS — L578 Other skin changes due to chronic exposure to nonionizing radiation: Secondary | ICD-10-CM | POA: Diagnosis not present

## 2019-11-10 DIAGNOSIS — C4442 Squamous cell carcinoma of skin of scalp and neck: Secondary | ICD-10-CM | POA: Diagnosis not present

## 2019-11-10 DIAGNOSIS — L821 Other seborrheic keratosis: Secondary | ICD-10-CM | POA: Diagnosis not present

## 2019-11-10 DIAGNOSIS — Z85828 Personal history of other malignant neoplasm of skin: Secondary | ICD-10-CM | POA: Diagnosis not present

## 2019-11-10 DIAGNOSIS — D225 Melanocytic nevi of trunk: Secondary | ICD-10-CM | POA: Diagnosis not present

## 2019-11-10 DIAGNOSIS — D485 Neoplasm of uncertain behavior of skin: Secondary | ICD-10-CM | POA: Diagnosis not present

## 2019-11-10 DIAGNOSIS — L814 Other melanin hyperpigmentation: Secondary | ICD-10-CM | POA: Diagnosis not present

## 2019-11-10 DIAGNOSIS — D1801 Hemangioma of skin and subcutaneous tissue: Secondary | ICD-10-CM | POA: Diagnosis not present

## 2019-11-15 DIAGNOSIS — D3131 Benign neoplasm of right choroid: Secondary | ICD-10-CM | POA: Diagnosis not present

## 2019-11-15 DIAGNOSIS — H35033 Hypertensive retinopathy, bilateral: Secondary | ICD-10-CM | POA: Diagnosis not present

## 2019-11-15 DIAGNOSIS — E7800 Pure hypercholesterolemia, unspecified: Secondary | ICD-10-CM | POA: Diagnosis not present

## 2019-11-15 DIAGNOSIS — H5210 Myopia, unspecified eye: Secondary | ICD-10-CM | POA: Diagnosis not present

## 2019-11-15 DIAGNOSIS — Z135 Encounter for screening for eye and ear disorders: Secondary | ICD-10-CM | POA: Diagnosis not present

## 2019-11-29 DIAGNOSIS — M5124 Other intervertebral disc displacement, thoracic region: Secondary | ICD-10-CM | POA: Diagnosis not present

## 2019-11-29 DIAGNOSIS — M9902 Segmental and somatic dysfunction of thoracic region: Secondary | ICD-10-CM | POA: Diagnosis not present

## 2019-11-29 DIAGNOSIS — M9901 Segmental and somatic dysfunction of cervical region: Secondary | ICD-10-CM | POA: Diagnosis not present

## 2019-11-29 DIAGNOSIS — M5022 Other cervical disc displacement, mid-cervical region, unspecified level: Secondary | ICD-10-CM | POA: Diagnosis not present

## 2019-11-29 DIAGNOSIS — M5126 Other intervertebral disc displacement, lumbar region: Secondary | ICD-10-CM | POA: Diagnosis not present

## 2019-11-29 DIAGNOSIS — M9903 Segmental and somatic dysfunction of lumbar region: Secondary | ICD-10-CM | POA: Diagnosis not present

## 2019-12-01 ENCOUNTER — Other Ambulatory Visit: Payer: Self-pay | Admitting: Cardiology

## 2019-12-01 DIAGNOSIS — D044 Carcinoma in situ of skin of scalp and neck: Secondary | ICD-10-CM | POA: Diagnosis not present

## 2019-12-22 DIAGNOSIS — I10 Essential (primary) hypertension: Secondary | ICD-10-CM | POA: Diagnosis not present

## 2019-12-22 DIAGNOSIS — E782 Mixed hyperlipidemia: Secondary | ICD-10-CM | POA: Diagnosis not present

## 2019-12-22 DIAGNOSIS — N400 Enlarged prostate without lower urinary tract symptoms: Secondary | ICD-10-CM | POA: Diagnosis not present

## 2019-12-22 DIAGNOSIS — I252 Old myocardial infarction: Secondary | ICD-10-CM | POA: Diagnosis not present

## 2019-12-22 DIAGNOSIS — I2510 Atherosclerotic heart disease of native coronary artery without angina pectoris: Secondary | ICD-10-CM | POA: Diagnosis not present

## 2019-12-22 DIAGNOSIS — M1990 Unspecified osteoarthritis, unspecified site: Secondary | ICD-10-CM | POA: Diagnosis not present

## 2019-12-22 DIAGNOSIS — E781 Pure hyperglyceridemia: Secondary | ICD-10-CM | POA: Diagnosis not present

## 2019-12-27 DIAGNOSIS — M5022 Other cervical disc displacement, mid-cervical region, unspecified level: Secondary | ICD-10-CM | POA: Diagnosis not present

## 2019-12-27 DIAGNOSIS — M5126 Other intervertebral disc displacement, lumbar region: Secondary | ICD-10-CM | POA: Diagnosis not present

## 2019-12-27 DIAGNOSIS — M9902 Segmental and somatic dysfunction of thoracic region: Secondary | ICD-10-CM | POA: Diagnosis not present

## 2019-12-27 DIAGNOSIS — M9903 Segmental and somatic dysfunction of lumbar region: Secondary | ICD-10-CM | POA: Diagnosis not present

## 2019-12-27 DIAGNOSIS — M5124 Other intervertebral disc displacement, thoracic region: Secondary | ICD-10-CM | POA: Diagnosis not present

## 2019-12-27 DIAGNOSIS — M9901 Segmental and somatic dysfunction of cervical region: Secondary | ICD-10-CM | POA: Diagnosis not present

## 2020-01-06 DIAGNOSIS — G4733 Obstructive sleep apnea (adult) (pediatric): Secondary | ICD-10-CM | POA: Diagnosis not present

## 2020-01-17 DIAGNOSIS — M9901 Segmental and somatic dysfunction of cervical region: Secondary | ICD-10-CM | POA: Diagnosis not present

## 2020-01-17 DIAGNOSIS — M5126 Other intervertebral disc displacement, lumbar region: Secondary | ICD-10-CM | POA: Diagnosis not present

## 2020-01-17 DIAGNOSIS — M9902 Segmental and somatic dysfunction of thoracic region: Secondary | ICD-10-CM | POA: Diagnosis not present

## 2020-01-17 DIAGNOSIS — M5022 Other cervical disc displacement, mid-cervical region, unspecified level: Secondary | ICD-10-CM | POA: Diagnosis not present

## 2020-01-17 DIAGNOSIS — M5124 Other intervertebral disc displacement, thoracic region: Secondary | ICD-10-CM | POA: Diagnosis not present

## 2020-01-17 DIAGNOSIS — M9903 Segmental and somatic dysfunction of lumbar region: Secondary | ICD-10-CM | POA: Diagnosis not present

## 2020-01-24 DIAGNOSIS — G479 Sleep disorder, unspecified: Secondary | ICD-10-CM | POA: Diagnosis not present

## 2020-01-24 DIAGNOSIS — I252 Old myocardial infarction: Secondary | ICD-10-CM | POA: Diagnosis not present

## 2020-01-24 DIAGNOSIS — I10 Essential (primary) hypertension: Secondary | ICD-10-CM | POA: Diagnosis not present

## 2020-01-24 DIAGNOSIS — Z125 Encounter for screening for malignant neoplasm of prostate: Secondary | ICD-10-CM | POA: Diagnosis not present

## 2020-01-24 DIAGNOSIS — Z955 Presence of coronary angioplasty implant and graft: Secondary | ICD-10-CM | POA: Diagnosis not present

## 2020-01-24 DIAGNOSIS — E782 Mixed hyperlipidemia: Secondary | ICD-10-CM | POA: Diagnosis not present

## 2020-01-29 DIAGNOSIS — H9202 Otalgia, left ear: Secondary | ICD-10-CM | POA: Diagnosis not present

## 2020-01-29 DIAGNOSIS — H6123 Impacted cerumen, bilateral: Secondary | ICD-10-CM | POA: Diagnosis not present

## 2020-02-06 DIAGNOSIS — E782 Mixed hyperlipidemia: Secondary | ICD-10-CM | POA: Diagnosis not present

## 2020-02-06 DIAGNOSIS — N400 Enlarged prostate without lower urinary tract symptoms: Secondary | ICD-10-CM | POA: Diagnosis not present

## 2020-02-06 DIAGNOSIS — I2510 Atherosclerotic heart disease of native coronary artery without angina pectoris: Secondary | ICD-10-CM | POA: Diagnosis not present

## 2020-02-06 DIAGNOSIS — I252 Old myocardial infarction: Secondary | ICD-10-CM | POA: Diagnosis not present

## 2020-02-06 DIAGNOSIS — M1990 Unspecified osteoarthritis, unspecified site: Secondary | ICD-10-CM | POA: Diagnosis not present

## 2020-02-06 DIAGNOSIS — I10 Essential (primary) hypertension: Secondary | ICD-10-CM | POA: Diagnosis not present

## 2020-02-08 DIAGNOSIS — I10 Essential (primary) hypertension: Secondary | ICD-10-CM | POA: Diagnosis not present

## 2020-02-08 DIAGNOSIS — E782 Mixed hyperlipidemia: Secondary | ICD-10-CM | POA: Diagnosis not present

## 2020-02-08 DIAGNOSIS — G479 Sleep disorder, unspecified: Secondary | ICD-10-CM | POA: Diagnosis not present

## 2020-02-08 DIAGNOSIS — Z955 Presence of coronary angioplasty implant and graft: Secondary | ICD-10-CM | POA: Diagnosis not present

## 2020-02-08 DIAGNOSIS — Z125 Encounter for screening for malignant neoplasm of prostate: Secondary | ICD-10-CM | POA: Diagnosis not present

## 2020-02-08 DIAGNOSIS — I252 Old myocardial infarction: Secondary | ICD-10-CM | POA: Diagnosis not present

## 2020-02-14 DIAGNOSIS — M5124 Other intervertebral disc displacement, thoracic region: Secondary | ICD-10-CM | POA: Diagnosis not present

## 2020-02-14 DIAGNOSIS — M5022 Other cervical disc displacement, mid-cervical region, unspecified level: Secondary | ICD-10-CM | POA: Diagnosis not present

## 2020-02-14 DIAGNOSIS — M9903 Segmental and somatic dysfunction of lumbar region: Secondary | ICD-10-CM | POA: Diagnosis not present

## 2020-02-14 DIAGNOSIS — M5126 Other intervertebral disc displacement, lumbar region: Secondary | ICD-10-CM | POA: Diagnosis not present

## 2020-02-14 DIAGNOSIS — M9902 Segmental and somatic dysfunction of thoracic region: Secondary | ICD-10-CM | POA: Diagnosis not present

## 2020-02-14 DIAGNOSIS — M9901 Segmental and somatic dysfunction of cervical region: Secondary | ICD-10-CM | POA: Diagnosis not present

## 2020-02-21 ENCOUNTER — Ambulatory Visit: Payer: Medicare HMO | Attending: Internal Medicine

## 2020-02-21 DIAGNOSIS — Z23 Encounter for immunization: Secondary | ICD-10-CM | POA: Insufficient documentation

## 2020-02-21 NOTE — Progress Notes (Signed)
   Covid-19 Vaccination Clinic  Name:  Jacob Parker    MRN: [id number] DOB: 08-14-1951  02/21/2020  [name] was observed post Covid-19 immunization for 15 minutes without incidence. He was provided with Vaccine Information Sheet and instruction to access the V-Safe system.   [name] was instructed to call 911 with any severe reactions post vaccine: Marland Kitchen Difficulty breathing  . Swelling of your face and throat  . A fast heartbeat  . A bad rash all over your body  . Dizziness and weakness    Immunizations Administered    Name Date Dose VIS Date Route   Pfizer COVID-19 Vaccine 02/21/2020  9:59 AM 0.3 mL 12/03/2019 Intramuscular   Manufacturer: [place]   Lot: [id number]   [place]: [id number]

## 2020-02-25 DIAGNOSIS — M9903 Segmental and somatic dysfunction of lumbar region: Secondary | ICD-10-CM | POA: Diagnosis not present

## 2020-02-25 DIAGNOSIS — M9902 Segmental and somatic dysfunction of thoracic region: Secondary | ICD-10-CM | POA: Diagnosis not present

## 2020-02-25 DIAGNOSIS — M9901 Segmental and somatic dysfunction of cervical region: Secondary | ICD-10-CM | POA: Diagnosis not present

## 2020-02-25 DIAGNOSIS — M5136 Other intervertebral disc degeneration, lumbar region: Secondary | ICD-10-CM | POA: Diagnosis not present

## 2020-02-25 DIAGNOSIS — M5032 Other cervical disc degeneration, mid-cervical region, unspecified level: Secondary | ICD-10-CM | POA: Diagnosis not present

## 2020-03-13 DIAGNOSIS — M9901 Segmental and somatic dysfunction of cervical region: Secondary | ICD-10-CM | POA: Diagnosis not present

## 2020-03-13 DIAGNOSIS — M9902 Segmental and somatic dysfunction of thoracic region: Secondary | ICD-10-CM | POA: Diagnosis not present

## 2020-03-13 DIAGNOSIS — M5136 Other intervertebral disc degeneration, lumbar region: Secondary | ICD-10-CM | POA: Diagnosis not present

## 2020-03-13 DIAGNOSIS — M9903 Segmental and somatic dysfunction of lumbar region: Secondary | ICD-10-CM | POA: Diagnosis not present

## 2020-03-13 DIAGNOSIS — M5032 Other cervical disc degeneration, mid-cervical region, unspecified level: Secondary | ICD-10-CM | POA: Diagnosis not present

## 2020-03-14 DIAGNOSIS — G479 Sleep disorder, unspecified: Secondary | ICD-10-CM | POA: Diagnosis not present

## 2020-03-14 DIAGNOSIS — G4730 Sleep apnea, unspecified: Secondary | ICD-10-CM | POA: Diagnosis not present

## 2020-03-21 ENCOUNTER — Ambulatory Visit: Payer: Medicare HMO | Attending: Internal Medicine

## 2020-03-21 DIAGNOSIS — Z23 Encounter for immunization: Secondary | ICD-10-CM

## 2020-03-21 NOTE — Progress Notes (Signed)
   Covid-19 Vaccination Clinic  Name:  Jacob Parker    MRN: [id number] DOB: 1951/06/21  03/21/2020  Jacob Parker was observed post Covid-19 immunization for 15 minutes without incident. He was provided with Vaccine Information Sheet and instruction to access the V-Safe system.   Jacob Parker was instructed to call 911 with any severe reactions post vaccine: Marland Kitchen Difficulty breathing  . Swelling of face and throat  . A fast heartbeat  . A bad rash all over body  . Dizziness and weakness   Immunizations Administered    Name Date Dose VIS Date Route   Pfizer COVID-19 Vaccine 03/21/2020  9:42 AM 0.3 mL 12/03/2019 Intramuscular   Manufacturer: [place]   Lot: [id number]   [place]: [id number]

## 2020-03-30 ENCOUNTER — Encounter: Payer: Self-pay | Admitting: Cardiology

## 2020-03-30 ENCOUNTER — Ambulatory Visit (INDEPENDENT_AMBULATORY_CARE_PROVIDER_SITE_OTHER): Payer: Medicare HMO | Admitting: Cardiology

## 2020-03-30 ENCOUNTER — Other Ambulatory Visit: Payer: Self-pay

## 2020-03-30 DIAGNOSIS — E782 Mixed hyperlipidemia: Secondary | ICD-10-CM

## 2020-03-30 DIAGNOSIS — I2510 Atherosclerotic heart disease of native coronary artery without angina pectoris: Secondary | ICD-10-CM

## 2020-03-30 DIAGNOSIS — G4733 Obstructive sleep apnea (adult) (pediatric): Secondary | ICD-10-CM

## 2020-03-30 DIAGNOSIS — I10 Essential (primary) hypertension: Secondary | ICD-10-CM

## 2020-03-30 NOTE — Patient Instructions (Signed)
Medication Instructions:  Your physician has recommended you make the following change in your medication:   Take Nitroglycerin as needed for chest pain.  *If you need a refill on your cardiac medications before your next appointment, please call your pharmacy*   Lab Work: None ordered If you have labs (blood work) drawn today and your tests are completely normal, you will receive your results only by: . MyChart Message (if you have MyChart) OR . A paper copy in the mail If you have any lab test that is abnormal or we need to change your treatment, we will call you to review the results.   Testing/Procedures: None ordered   Follow-Up: At [facility], you and your health needs are our priority.  As part of our continuing mission to provide you with exceptional heart care, we have created designated Provider Care Teams.  These Care Teams include your primary Cardiologist (physician) and Advanced Practice Providers (APPs -  Physician Assistants and Nurse Practitioners) who all work together to provide you with the care you need, when you need it.  We recommend signing up for the patient portal called "MyChart".  Sign up information is provided on this After Visit Summary.  MyChart is used to connect with patients for Virtual Visits (Telemedicine).  Patients are able to view lab/test results, encounter notes, upcoming appointments, etc.  Non-urgent messages can be sent to your provider as well.   To learn more about what you can do with MyChart, go to [email or web address].    Your next appointment:   6 month(s)  The format for your next appointment:   In Person  Provider:   Rajan Revankar, MD   Other Instructions Nitroglycerin sublingual tablets What is this medicine? NITROGLYCERIN (nye troe GLI ser in) is a type of vasodilator. It relaxes blood vessels, increasing the blood and oxygen supply to your heart. This medicine is used to relieve chest pain caused by angina. It is  also used to prevent chest pain before activities like climbing stairs, going outdoors in cold weather, or sexual activity. This medicine may be used for other purposes; ask your health care provider or pharmacist if you have questions. COMMON BRAND NAME(S): Nitroquick, Nitrostat, Nitrotab What should I tell my health care provider before I take this medicine? They need to know if you have any of these conditions:  anemia  head injury, recent stroke, or bleeding in the brain  liver disease  previous heart attack  an unusual or allergic reaction to nitroglycerin, other medicines, foods, dyes, or preservatives  pregnant or trying to get pregnant  breast-feeding How should I use this medicine? Take this medicine by mouth as needed. At the first sign of an angina attack (chest pain or tightness) place one tablet under your tongue. You can also take this medicine 5 to 10 minutes before an event likely to produce chest pain. Follow the directions on the prescription label. Let the tablet dissolve under the tongue. Do not swallow whole. Replace the dose if you accidentally swallow it. It will help if your mouth is not dry. Saliva around the tablet will help it to dissolve more quickly. Do not eat or drink, smoke or chew tobacco while a tablet is dissolving. If you are not better within 5 minutes after taking ONE dose of nitroglycerin, call 9-1-1 immediately to seek emergency medical care. Do not take more than 3 nitroglycerin tablets over 15 minutes. If you take this medicine often to relieve symptoms of angina, your doctor   or health care professional may provide you with different instructions to manage your symptoms. If symptoms do not go away after following these instructions, it is important to call 9-1-1 immediately. Do not take more than 3 nitroglycerin tablets over 15 minutes. Talk to your pediatrician regarding the use of this medicine in children. Special care may be needed. Overdosage: If  you think you have taken too much of this medicine contact a poison control center or emergency room at once. NOTE: This medicine is only for you. Do not share this medicine with others. What if I miss a dose? This does not apply. This medicine is only used as needed. What may interact with this medicine? Do not take this medicine with any of the following medications:  certain migraine medicines like ergotamine and dihydroergotamine (DHE)  medicines used to treat erectile dysfunction like sildenafil, tadalafil, and vardenafil  riociguat This medicine may also interact with the following medications:  alteplase  aspirin  heparin  medicines for high blood pressure  medicines for mental depression  other medicines used to treat angina  phenothiazines like chlorpromazine, mesoridazine, prochlorperazine, thioridazine This list may not describe all possible interactions. Give your health care provider a list of all the medicines, herbs, non-prescription drugs, or dietary supplements you use. Also tell them if you smoke, drink alcohol, or use illegal drugs. Some items may interact with your medicine. What should I watch for while using this medicine? Tell your doctor or health care professional if you feel your medicine is no longer working. Keep this medicine with you at all times. Sit or lie down when you take your medicine to prevent falling if you feel dizzy or faint after using it. Try to remain calm. This will help you to feel better faster. If you feel dizzy, take several deep breaths and lie down with your feet propped up, or bend forward with your head resting between your knees. You may get drowsy or dizzy. Do not drive, use machinery, or do anything that needs mental alertness until you know how this drug affects you. Do not stand or sit up quickly, especially if you are an older patient. This reduces the risk of dizzy or fainting spells. Alcohol can make you more drowsy and  dizzy. Avoid alcoholic drinks. Do not treat yourself for coughs, colds, or pain while you are taking this medicine without asking your doctor or health care professional for advice. Some ingredients may increase your blood pressure. What side effects may I notice from receiving this medicine? Side effects that you should report to your doctor or health care professional as soon as possible:  blurred vision  dry mouth  skin rash  sweating  the feeling of extreme pressure in the head  unusually weak or tired Side effects that usually do not require medical attention (report to your doctor or health care professional if they continue or are bothersome):  flushing of the face or neck  headache  irregular heartbeat, palpitations  nausea, vomiting This list may not describe all possible side effects. Call your doctor for medical advice about side effects. You may report side effects to FDA at 1-800-FDA-1088. Where should I keep my medicine? Keep out of the reach of children. Store at room temperature between 20 and 25 degrees C (68 and 77 degrees F). Store in original container. Protect from light and moisture. Keep tightly closed. Throw away any unused medicine after the expiration date. NOTE: This sheet is a summary. It may not   cover all possible information. If you have questions about this medicine, talk to your doctor, pharmacist, or health care provider.  2020 Elsevier/Gold Standard (2013-10-07 17:57:36)   

## 2020-03-30 NOTE — Progress Notes (Signed)
Cardiology Office Note:    Date:  03/30/2020   ID:  Jacob Parker, DOB 31-Jul-1951, MRN [id number]  PCP:  Leighton Ruff, MD  Cardiologist:  Jenean Lindau, MD   Referring MD: Leighton Ruff, MD    ASSESSMENT:    1. Coronary artery disease involving native coronary artery of native heart without angina pectoris   2. Essential hypertension   3. Mixed hyperlipidemia   4. Obstructive sleep apnea    PLAN:    In order of problems listed above:  1. Coronary artery disease: Secondary prevention stressed with the patient.  Importance of compliance with diet and medication stressed and he vocalized understanding importance of regular exercise stressed weight reduction was stressed risks of obesity explained and he promises to comply he was told to walk at least half an hour a day at least 5 times a week 2. Essential hypertension: Blood pressure is stable 3. Mixed dyslipidemia: Diet was emphasized his low recent lipids from his KPN sheet were unremarkable 4. Sleep apnea: Sleep health issues were discussed 5. Patient will be seen in follow-up appointment in 6 months or earlier if the patient has any concerns    Medication Adjustments/Labs and Tests Ordered: Current medicines are reviewed at length with the patient today.  Concerns regarding medicines are outlined above.  No orders of the defined types were placed in this encounter.  No orders of the defined types were placed in this encounter.    Chief Complaint  Patient presents with  . Follow-up    6 Months     History of Present Illness:    Jacob Parker is a 69 y.o. male.  Patient has past medical history of coronary artery disease with 2 stents in [place] in 2015.  He has history of essential hypertension dyslipidemia and sleep apnea.  He denies any problems at this time and takes care of activities of daily living.  No chest pain orthopnea or PND.  At the time of my evaluation, the patient is alert awake  oriented and in no distress.  He mentions to me that he has been lax with exercise and diet and has gained weight.  Past Medical History:  Diagnosis Date  . Coronary artery disease 2011   has 2 stents in [place]; as if 10/2014 followed by Dr. Wynonia Lawman in [place]   . DJD (degenerative joint disease)   . GERD (gastroesophageal reflux disease)   . History of skin cancer 1981 / 2015  . Hyperlipidemia   . Hypertension   . Sleep apnea    uses c pap  . Stricture esophagus     Past Surgical History:  Procedure Laterality Date  . APPENDECTOMY    . CARDIAC CATHETERIZATION N/A 12/30/2016   Procedure: Left Heart Cath and Coronary Angiography;  Surgeon: Lorretta Harp, MD;  Location: [facility];  Service: Cardiovascular;  Laterality: N/A;  . CARDIAC CATHETERIZATION N/A 12/30/2016   Procedure: Coronary Stent Intervention;  Surgeon: Lorretta Harp, MD;  Location: [facility];  Service: Cardiovascular;  Laterality: N/A;  . COLONOSCOPY    . CORONARY ANGIOPLASTY WITH STENT PLACEMENT  2011    x 2 stents (Xience RCA and DIAG stent 2011, [place])  . FRACTURE SURGERY  1985   l foot fx / l leg  . HERNIA REPAIR  1973  . TOTAL KNEE ARTHROPLASTY Left 11/16/2014   Procedure: LEFT TOTAL KNEE ARTHROPLASTY;  Surgeon: Alta Corning, MD;  Location: [facility];  Service: Orthopedics;  Laterality: Left;  . TOTAL KNEE ARTHROPLASTY Right 01/23/2015   Procedure: RIGHT TOTAL KNEE ARTHROPLASTY;  Surgeon: Alta Corning, MD;  Location: [facility];  Service: Orthopedics;  Laterality: Right;  . UPPER GI ENDOSCOPY    . VASECTOMY      Current Medications: Current Meds  Medication Sig  . acetaminophen (TYLENOL) 325 MG tablet Take 2 tablets (650 mg total) by mouth every 4 (four) hours as needed for headache or mild pain.  Marland Kitchen aspirin EC 81 MG tablet Take 81 mg by mouth daily.  Marland Kitchen esomeprazole (NEXIUM) 20 MG capsule Take 20 mg by mouth daily before breakfast.  . lisinopril (ZESTRIL) 20 MG tablet TAKE 1 TABLET AT BEDTIME  .  Multiple Vitamin (MULTIVITAMIN WITH MINERALS) TABS tablet Take 1 tablet by mouth daily.  . nebivolol (BYSTOLIC) 5 MG tablet TAKE 1 TABLET EVERY DAY  . nitroGLYCERIN (NITROSTAT) 0.4 MG SL tablet Place 1 tablet (0.4 mg total) under the tongue every 5 (five) minutes as needed.  . Omega-3 Fatty Acids (FISH OIL) 1000 MG CAPS Take 1,000 mg by mouth daily.  . rosuvastatin (CRESTOR) 40 MG tablet TAKE 1 TABLET AT BEDTIME     Allergies:   Patient has no known allergies.   Social History   Socioeconomic History  . Marital status: Married    Spouse name: Not on file  . Number of children: Not on file  . Years of education: Not on file  . Highest education level: Not on file  Occupational History  . Not on file  Tobacco Use  . Smoking status: Never Smoker  . Smokeless tobacco: Never Used  Substance and Sexual Activity  . Alcohol use: Yes    Alcohol/week: 3.0 standard drinks    Types: 3 Standard drinks or equivalent per week    Comment: occasional  . Drug use: No  . Sexual activity: Yes  Other Topics Concern  . Not on file  Social History Narrative  . Not on file   Social Determinants of Health   Financial Resource Strain:   . Difficulty of Paying Living Expenses:   Food Insecurity:   . Worried About Charity fundraiser in the Last Year:   . Arboriculturist in the Last Year:   Transportation Needs:   . Film/video editor (Medical):   Marland Kitchen Lack of Transportation (Non-Medical):   Physical Activity:   . Days of Exercise per Week:   . Minutes of Exercise per Session:   Stress:   . Feeling of Stress :   Social Connections:   . Frequency of Communication with Friends and Family:   . Frequency of Social Gatherings with Friends and Family:   . Attends Religious Services:   . Active Member of Clubs or Organizations:   . Attends Archivist Meetings:   Marland Kitchen Marital Status:      Family History: The patient's family history includes Alzheimer's disease in his mother; CAD in  his mother.  ROS:   Please see the history of present illness.    All other systems reviewed and are negative.  EKGs/Labs/Other Studies Reviewed:    The following studies were reviewed today: EKG reveals sinus rhythm early repolarization and nonspecific ST-T changes   Recent Labs: No results found for requested labs within last 8760 hours.  Recent Lipid Panel    Component Value Date/Time   CHOL 156 12/28/2016 1630   TRIG 150 (H) 12/28/2016 1630   HDL 47 12/28/2016 1630   CHOLHDL  3.3 12/28/2016 1630   VLDL 30 12/28/2016 1630   LDLCALC 79 12/28/2016 1630    Physical Exam:    VS:  BP 120/82   Pulse 70   Ht 6\' 3"  (1.905 m)   Wt 234 lb (106.1 kg)   SpO2 98%   BMI 29.25 kg/m     Wt Readings from Last 3 Encounters:  03/30/20 234 lb (106.1 kg)  10/19/19 224 lb 1.3 oz (101.6 kg)  11/25/18 221 lb (100.2 kg)     GEN: Patient is in no acute distress HEENT: Normal NECK: No JVD; No carotid bruits LYMPHATICS: No lymphadenopathy CARDIAC: Hear sounds regular, 2/6 systolic murmur at the apex. RESPIRATORY:  Clear to auscultation without rales, wheezing or rhonchi  ABDOMEN: Soft, non-tender, non-distended MUSCULOSKELETAL:  No edema; No deformity  SKIN: Warm and dry NEUROLOGIC:  Alert and oriented x 3 PSYCHIATRIC:  Normal affect   Signed, Jenean Lindau, MD  03/30/2020 9:31 AM    [place]

## 2020-04-06 DIAGNOSIS — G4733 Obstructive sleep apnea (adult) (pediatric): Secondary | ICD-10-CM | POA: Diagnosis not present

## 2020-04-10 DIAGNOSIS — G4733 Obstructive sleep apnea (adult) (pediatric): Secondary | ICD-10-CM | POA: Diagnosis not present

## 2020-04-11 DIAGNOSIS — I10 Essential (primary) hypertension: Secondary | ICD-10-CM | POA: Diagnosis not present

## 2020-04-11 DIAGNOSIS — I252 Old myocardial infarction: Secondary | ICD-10-CM | POA: Diagnosis not present

## 2020-04-11 DIAGNOSIS — N400 Enlarged prostate without lower urinary tract symptoms: Secondary | ICD-10-CM | POA: Diagnosis not present

## 2020-04-11 DIAGNOSIS — E782 Mixed hyperlipidemia: Secondary | ICD-10-CM | POA: Diagnosis not present

## 2020-04-11 DIAGNOSIS — M1990 Unspecified osteoarthritis, unspecified site: Secondary | ICD-10-CM | POA: Diagnosis not present

## 2020-04-11 DIAGNOSIS — I2510 Atherosclerotic heart disease of native coronary artery without angina pectoris: Secondary | ICD-10-CM | POA: Diagnosis not present

## 2020-04-17 DIAGNOSIS — M9903 Segmental and somatic dysfunction of lumbar region: Secondary | ICD-10-CM | POA: Diagnosis not present

## 2020-04-17 DIAGNOSIS — M5032 Other cervical disc degeneration, mid-cervical region, unspecified level: Secondary | ICD-10-CM | POA: Diagnosis not present

## 2020-04-17 DIAGNOSIS — M9902 Segmental and somatic dysfunction of thoracic region: Secondary | ICD-10-CM | POA: Diagnosis not present

## 2020-04-17 DIAGNOSIS — M5136 Other intervertebral disc degeneration, lumbar region: Secondary | ICD-10-CM | POA: Diagnosis not present

## 2020-04-17 DIAGNOSIS — M9901 Segmental and somatic dysfunction of cervical region: Secondary | ICD-10-CM | POA: Diagnosis not present

## 2020-04-18 ENCOUNTER — Other Ambulatory Visit: Payer: Self-pay | Admitting: Cardiology

## 2020-04-21 ENCOUNTER — Other Ambulatory Visit: Payer: Self-pay | Admitting: Cardiology

## 2020-05-01 DIAGNOSIS — M9902 Segmental and somatic dysfunction of thoracic region: Secondary | ICD-10-CM | POA: Diagnosis not present

## 2020-05-01 DIAGNOSIS — M9903 Segmental and somatic dysfunction of lumbar region: Secondary | ICD-10-CM | POA: Diagnosis not present

## 2020-05-01 DIAGNOSIS — M9901 Segmental and somatic dysfunction of cervical region: Secondary | ICD-10-CM | POA: Diagnosis not present

## 2020-05-01 DIAGNOSIS — M5032 Other cervical disc degeneration, mid-cervical region, unspecified level: Secondary | ICD-10-CM | POA: Diagnosis not present

## 2020-05-01 DIAGNOSIS — M5136 Other intervertebral disc degeneration, lumbar region: Secondary | ICD-10-CM | POA: Diagnosis not present

## 2020-05-03 DIAGNOSIS — M9902 Segmental and somatic dysfunction of thoracic region: Secondary | ICD-10-CM | POA: Diagnosis not present

## 2020-05-03 DIAGNOSIS — M5136 Other intervertebral disc degeneration, lumbar region: Secondary | ICD-10-CM | POA: Diagnosis not present

## 2020-05-03 DIAGNOSIS — M9903 Segmental and somatic dysfunction of lumbar region: Secondary | ICD-10-CM | POA: Diagnosis not present

## 2020-05-03 DIAGNOSIS — M9901 Segmental and somatic dysfunction of cervical region: Secondary | ICD-10-CM | POA: Diagnosis not present

## 2020-05-03 DIAGNOSIS — M5032 Other cervical disc degeneration, mid-cervical region, unspecified level: Secondary | ICD-10-CM | POA: Diagnosis not present

## 2020-05-09 DIAGNOSIS — L821 Other seborrheic keratosis: Secondary | ICD-10-CM | POA: Diagnosis not present

## 2020-05-09 DIAGNOSIS — L905 Scar conditions and fibrosis of skin: Secondary | ICD-10-CM | POA: Diagnosis not present

## 2020-05-09 DIAGNOSIS — L738 Other specified follicular disorders: Secondary | ICD-10-CM | POA: Diagnosis not present

## 2020-05-09 DIAGNOSIS — L570 Actinic keratosis: Secondary | ICD-10-CM | POA: Diagnosis not present

## 2020-05-09 DIAGNOSIS — D1801 Hemangioma of skin and subcutaneous tissue: Secondary | ICD-10-CM | POA: Diagnosis not present

## 2020-05-09 DIAGNOSIS — D225 Melanocytic nevi of trunk: Secondary | ICD-10-CM | POA: Diagnosis not present

## 2020-05-09 DIAGNOSIS — C44629 Squamous cell carcinoma of skin of left upper limb, including shoulder: Secondary | ICD-10-CM | POA: Diagnosis not present

## 2020-05-09 DIAGNOSIS — D485 Neoplasm of uncertain behavior of skin: Secondary | ICD-10-CM | POA: Diagnosis not present

## 2020-05-09 DIAGNOSIS — Z85828 Personal history of other malignant neoplasm of skin: Secondary | ICD-10-CM | POA: Diagnosis not present

## 2020-05-15 DIAGNOSIS — M9902 Segmental and somatic dysfunction of thoracic region: Secondary | ICD-10-CM | POA: Diagnosis not present

## 2020-05-15 DIAGNOSIS — M9903 Segmental and somatic dysfunction of lumbar region: Secondary | ICD-10-CM | POA: Diagnosis not present

## 2020-05-15 DIAGNOSIS — M5032 Other cervical disc degeneration, mid-cervical region, unspecified level: Secondary | ICD-10-CM | POA: Diagnosis not present

## 2020-05-15 DIAGNOSIS — M9901 Segmental and somatic dysfunction of cervical region: Secondary | ICD-10-CM | POA: Diagnosis not present

## 2020-05-15 DIAGNOSIS — M5136 Other intervertebral disc degeneration, lumbar region: Secondary | ICD-10-CM | POA: Diagnosis not present

## 2020-05-19 DIAGNOSIS — G4733 Obstructive sleep apnea (adult) (pediatric): Secondary | ICD-10-CM | POA: Diagnosis not present

## 2020-06-06 DIAGNOSIS — M2550 Pain in unspecified joint: Secondary | ICD-10-CM | POA: Diagnosis not present

## 2020-06-12 ENCOUNTER — Other Ambulatory Visit: Payer: Self-pay | Admitting: Gastroenterology

## 2020-06-12 DIAGNOSIS — R1310 Dysphagia, unspecified: Secondary | ICD-10-CM | POA: Diagnosis not present

## 2020-06-12 DIAGNOSIS — K219 Gastro-esophageal reflux disease without esophagitis: Secondary | ICD-10-CM | POA: Diagnosis not present

## 2020-06-14 ENCOUNTER — Other Ambulatory Visit: Payer: Self-pay | Admitting: Cardiology

## 2020-06-19 DIAGNOSIS — G4733 Obstructive sleep apnea (adult) (pediatric): Secondary | ICD-10-CM | POA: Diagnosis not present

## 2020-06-20 DIAGNOSIS — E782 Mixed hyperlipidemia: Secondary | ICD-10-CM | POA: Diagnosis not present

## 2020-06-20 DIAGNOSIS — I10 Essential (primary) hypertension: Secondary | ICD-10-CM | POA: Diagnosis not present

## 2020-06-20 DIAGNOSIS — N400 Enlarged prostate without lower urinary tract symptoms: Secondary | ICD-10-CM | POA: Diagnosis not present

## 2020-06-20 DIAGNOSIS — M1990 Unspecified osteoarthritis, unspecified site: Secondary | ICD-10-CM | POA: Diagnosis not present

## 2020-06-20 DIAGNOSIS — I2510 Atherosclerotic heart disease of native coronary artery without angina pectoris: Secondary | ICD-10-CM | POA: Diagnosis not present

## 2020-06-20 DIAGNOSIS — I252 Old myocardial infarction: Secondary | ICD-10-CM | POA: Diagnosis not present

## 2020-07-03 DIAGNOSIS — M5136 Other intervertebral disc degeneration, lumbar region: Secondary | ICD-10-CM | POA: Diagnosis not present

## 2020-07-03 DIAGNOSIS — M9903 Segmental and somatic dysfunction of lumbar region: Secondary | ICD-10-CM | POA: Diagnosis not present

## 2020-07-03 DIAGNOSIS — M5032 Other cervical disc degeneration, mid-cervical region, unspecified level: Secondary | ICD-10-CM | POA: Diagnosis not present

## 2020-07-03 DIAGNOSIS — M9901 Segmental and somatic dysfunction of cervical region: Secondary | ICD-10-CM | POA: Diagnosis not present

## 2020-07-03 DIAGNOSIS — M9902 Segmental and somatic dysfunction of thoracic region: Secondary | ICD-10-CM | POA: Diagnosis not present

## 2020-07-05 DIAGNOSIS — G4733 Obstructive sleep apnea (adult) (pediatric): Secondary | ICD-10-CM | POA: Diagnosis not present

## 2020-07-07 DIAGNOSIS — M542 Cervicalgia: Secondary | ICD-10-CM | POA: Diagnosis not present

## 2020-07-08 ENCOUNTER — Other Ambulatory Visit (HOSPITAL_COMMUNITY)
Admission: RE | Admit: 2020-07-08 | Discharge: 2020-07-08 | Disposition: A | Discharge status: Home / Self Care | Payer: Medicare HMO | Source: Ambulatory Visit | Attending: Gastroenterology | Admitting: Gastroenterology

## 2020-07-08 DIAGNOSIS — Z20822 Contact with and (suspected) exposure to covid-19: Secondary | ICD-10-CM | POA: Diagnosis not present

## 2020-07-08 DIAGNOSIS — Z01812 Encounter for preprocedural laboratory examination: Secondary | ICD-10-CM | POA: Insufficient documentation

## 2020-07-08 LAB — SARS CORONAVIRUS 2 (TAT 6-24 HRS): SARS Coronavirus 2: NEGATIVE

## 2020-07-12 ENCOUNTER — Encounter (HOSPITAL_COMMUNITY): Payer: Self-pay | Admitting: Gastroenterology

## 2020-07-12 ENCOUNTER — Ambulatory Visit (HOSPITAL_COMMUNITY)
Admission: RE | Admit: 2020-07-12 | Discharge: 2020-07-12 | Disposition: A | Discharge status: Home / Self Care | Payer: Medicare HMO | Attending: Gastroenterology | Admitting: Gastroenterology

## 2020-07-12 ENCOUNTER — Encounter (HOSPITAL_COMMUNITY)
Admission: RE | Disposition: A | Discharge status: Home / Self Care | Payer: Self-pay | Source: Home / Self Care | Attending: Gastroenterology

## 2020-07-12 DIAGNOSIS — R1310 Dysphagia, unspecified: Secondary | ICD-10-CM | POA: Diagnosis not present

## 2020-07-12 HISTORY — PX: ESOPHAGEAL MANOMETRY: SHX5429

## 2020-07-12 SURGERY — MANOMETRY, ESOPHAGUS

## 2020-07-12 MED ORDER — LIDOCAINE VISCOUS HCL 2 % MT SOLN
OROMUCOSAL | Status: AC
  Filled 2020-07-12: qty 15

## 2020-07-12 SURGICAL SUPPLY — 2 items
FACESHIELD LNG OPTICON STERILE (SAFETY) IMPLANT
GLOVE BIO SURGEON STRL SZ8 (GLOVE) ×6 IMPLANT

## 2020-07-12 NOTE — Progress Notes (Signed)
Esophageal manometry performed per protocol without complications.  Patient tolerated well. 

## 2020-07-13 DIAGNOSIS — R1310 Dysphagia, unspecified: Secondary | ICD-10-CM | POA: Diagnosis not present

## 2020-07-19 DIAGNOSIS — M9901 Segmental and somatic dysfunction of cervical region: Secondary | ICD-10-CM | POA: Diagnosis not present

## 2020-07-19 DIAGNOSIS — G4733 Obstructive sleep apnea (adult) (pediatric): Secondary | ICD-10-CM | POA: Diagnosis not present

## 2020-07-19 DIAGNOSIS — M5136 Other intervertebral disc degeneration, lumbar region: Secondary | ICD-10-CM | POA: Diagnosis not present

## 2020-07-19 DIAGNOSIS — M5032 Other cervical disc degeneration, mid-cervical region, unspecified level: Secondary | ICD-10-CM | POA: Diagnosis not present

## 2020-07-19 DIAGNOSIS — M9902 Segmental and somatic dysfunction of thoracic region: Secondary | ICD-10-CM | POA: Diagnosis not present

## 2020-07-19 DIAGNOSIS — M9903 Segmental and somatic dysfunction of lumbar region: Secondary | ICD-10-CM | POA: Diagnosis not present

## 2020-07-20 ENCOUNTER — Emergency Department (HOSPITAL_COMMUNITY)
Admission: EM | Admit: 2020-07-20 | Discharge: 2020-07-20 | Disposition: A | Discharge status: Home / Self Care | Payer: Medicare HMO | Attending: Emergency Medicine | Admitting: Emergency Medicine

## 2020-07-20 ENCOUNTER — Encounter (HOSPITAL_COMMUNITY): Payer: Self-pay

## 2020-07-20 ENCOUNTER — Emergency Department (HOSPITAL_COMMUNITY): Payer: Medicare HMO

## 2020-07-20 DIAGNOSIS — Z7982 Long term (current) use of aspirin: Secondary | ICD-10-CM | POA: Insufficient documentation

## 2020-07-20 DIAGNOSIS — M4692 Unspecified inflammatory spondylopathy, cervical region: Secondary | ICD-10-CM | POA: Diagnosis not present

## 2020-07-20 DIAGNOSIS — I2510 Atherosclerotic heart disease of native coronary artery without angina pectoris: Secondary | ICD-10-CM | POA: Diagnosis not present

## 2020-07-20 DIAGNOSIS — M62838 Other muscle spasm: Secondary | ICD-10-CM

## 2020-07-20 DIAGNOSIS — M542 Cervicalgia: Secondary | ICD-10-CM | POA: Diagnosis not present

## 2020-07-20 DIAGNOSIS — M62830 Muscle spasm of back: Secondary | ICD-10-CM | POA: Insufficient documentation

## 2020-07-20 DIAGNOSIS — Z955 Presence of coronary angioplasty implant and graft: Secondary | ICD-10-CM | POA: Diagnosis not present

## 2020-07-20 DIAGNOSIS — M47812 Spondylosis without myelopathy or radiculopathy, cervical region: Secondary | ICD-10-CM

## 2020-07-20 DIAGNOSIS — I10 Essential (primary) hypertension: Secondary | ICD-10-CM | POA: Insufficient documentation

## 2020-07-20 DIAGNOSIS — Z85828 Personal history of other malignant neoplasm of skin: Secondary | ICD-10-CM | POA: Insufficient documentation

## 2020-07-20 DIAGNOSIS — J984 Other disorders of lung: Secondary | ICD-10-CM | POA: Diagnosis not present

## 2020-07-20 DIAGNOSIS — Z79899 Other long term (current) drug therapy: Secondary | ICD-10-CM | POA: Insufficient documentation

## 2020-07-20 LAB — BASIC METABOLIC PANEL
Anion gap: 8 (ref 5–15)
BUN: 10 mg/dL (ref 8–23)
CO2: 27 mmol/L (ref 22–32)
Calcium: 9.6 mg/dL (ref 8.9–10.3)
Chloride: 103 mmol/L (ref 98–111)
Creatinine, Ser: 0.92 mg/dL (ref 0.61–1.24)
GFR calc Af Amer: >60 mL/min (ref >60)
GFR calc non Af Amer: >60 mL/min (ref >60)
Glucose, Bld: 116 mg/dL — ABNORMAL HIGH (ref 70–99)
Potassium: 3.8 mmol/L (ref 3.5–5.1)
Sodium: 138 mmol/L (ref 135–145)

## 2020-07-20 LAB — CBC WITH DIFFERENTIAL/PLATELET
Abs Immature Granulocytes: 0.04 10*3/uL (ref 0.00–0.07)
Basophils Absolute: 0.1 10*3/uL (ref 0.0–0.1)
Basophils Relative: 1 %
Eosinophils Absolute: 0.3 10*3/uL (ref 0.0–0.5)
Eosinophils Relative: 2 %
HCT: 44.4 % (ref 39.0–52.0)
Hemoglobin: 15.3 g/dL (ref 13.0–17.0)
Immature Granulocytes: 0 %
Lymphocytes Relative: 10 %
Lymphs Abs: 1 10*3/uL (ref 0.7–4.0)
MCH: 30.5 pg (ref 26.0–34.0)
MCHC: 34.5 g/dL (ref 30.0–36.0)
MCV: 88.6 fL (ref 80.0–100.0)
Monocytes Absolute: 0.6 10*3/uL (ref 0.1–1.0)
Monocytes Relative: 6 %
Neutro Abs: 8.5 10*3/uL — ABNORMAL HIGH (ref 1.7–7.7)
Neutrophils Relative %: 81 %
Platelets: 220 10*3/uL (ref 150–400)
RBC: 5.01 MIL/uL (ref 4.22–5.81)
RDW: 12.2 % (ref 11.5–15.5)
WBC: 10.5 10*3/uL (ref 4.0–10.5)
nRBC: 0 % (ref 0.0–0.2)

## 2020-07-20 LAB — TROPONIN I (HIGH SENSITIVITY): Troponin I (High Sensitivity): 3 ng/L (ref <18)

## 2020-07-20 MED ORDER — METHOCARBAMOL 500 MG PO TABS
500.0000 mg | ORAL_TABLET | Freq: Two times a day (BID) | ORAL | 0 refills | Status: DC
Start: 2020-07-20 — End: 2020-10-02

## 2020-07-20 MED ORDER — PREDNISONE 20 MG PO TABS
60.0000 mg | ORAL_TABLET | Freq: Once | ORAL | Status: AC
  Administered 2020-07-20: 60 mg via ORAL
  Filled 2020-07-20: qty 3

## 2020-07-20 MED ORDER — PREDNISONE 20 MG PO TABS
ORAL_TABLET | ORAL | 0 refills | Status: DC
Start: 2020-07-20 — End: 2020-10-02

## 2020-07-20 MED ORDER — METHOCARBAMOL 500 MG PO TABS
1000.0000 mg | ORAL_TABLET | Freq: Once | ORAL | Status: AC
  Administered 2020-07-20: 1000 mg via ORAL
  Filled 2020-07-20: qty 2

## 2020-07-20 MED ORDER — LIDOCAINE 5 % EX PTCH: 1.0000 | MEDICATED_PATCH | CUTANEOUS | 0 refills | Status: DC

## 2020-07-20 MED ORDER — LIDOCAINE 5 % EX PTCH
1.0000 | MEDICATED_PATCH | CUTANEOUS | Status: DC
  Administered 2020-07-20: 1 via TRANSDERMAL
  Filled 2020-07-20: qty 1

## 2020-07-20 NOTE — ED Triage Notes (Signed)
Pt thought he just had a sore neck and two days later it wasn't any better, he was seen at Urgent Care and they gave him flexeril which eased it but now it's back and worse than before, he has no relief ans can't sleep

## 2020-07-20 NOTE — ED Provider Notes (Signed)
[facility] DEPT Provider Note   CSN: [id number] Arrival date & time: 07/20/20  0277     History No chief complaint on file.   Jacob Parker is a 69 y.o. male.  The history is provided by the patient.  Illness Location:  Right neck Quality:  Spasm and stiffness  Severity:  Moderate Onset quality:  Gradual Duration:  2 weeks Timing:  Constant Progression:  Waxing and waning Chronicity:  New Context:  No injury  Relieved by:  Nothing Worsened by:  Nothing  Ineffective treatments:  Flexeril Associated symptoms: no abdominal pain, no chest pain, no congestion, no cough, no diarrhea, no ear pain, no fatigue, no fever, no headaches, no loss of consciousness, no myalgias, no nausea, no rash, no rhinorrhea, no shortness of breath, no sore throat, no vomiting and no wheezing   Risk factors:  Elderly       Past Medical History:  Diagnosis Date   Coronary artery disease 2011   has 2 stents in [place]; as if 10/2014 followed by Dr. Wynonia Lawman in [place]    DJD (degenerative joint disease)    GERD (gastroesophageal reflux disease)    History of skin cancer 1981 / 2015   Hyperlipidemia    Hypertension    Sleep apnea    uses c pap   Stricture esophagus     Patient Active Problem List   Diagnosis Date Noted   NSTEMI (non-ST elevated myocardial infarction) ([place]) 12/29/2016   Essential hypertension 12/28/2016   GERD (gastroesophageal reflux disease) 12/28/2016   Stricture esophagus    Hyperlipidemia    Bilateral hearing loss 03/08/2016   Bilateral impacted cerumen 03/08/2016   Obstructive sleep apnea 03/08/2016   Primary osteoarthritis of right knee 01/23/2015   Primary osteoarthritis of left knee 11/16/2014   Coronary artery disease 12/23/2009    Past Surgical History:  Procedure Laterality Date   APPENDECTOMY     CARDIAC CATHETERIZATION N/A 12/30/2016   Procedure: Left Heart Cath and Coronary Angiography;  Surgeon:  Lorretta Harp, MD;  Location: [facility];  Service: Cardiovascular;  Laterality: N/A;   CARDIAC CATHETERIZATION N/A 12/30/2016   Procedure: Coronary Stent Intervention;  Surgeon: Lorretta Harp, MD;  Location: [facility];  Service: Cardiovascular;  Laterality: N/A;   COLONOSCOPY     CORONARY ANGIOPLASTY WITH STENT PLACEMENT  2011    x 2 stents (Xience RCA and DIAG stent 2011, [place])   ESOPHAGEAL MANOMETRY N/A 07/12/2020   Procedure: ESOPHAGEAL MANOMETRY (EM);  Surgeon: Wilford Corner, MD;  [facility];  Service: Endoscopy;  Laterality: N/A;   FRACTURE SURGERY  1985   l foot fx / l leg   HERNIA REPAIR  1973   TOTAL KNEE ARTHROPLASTY Left 11/16/2014   Procedure: LEFT TOTAL KNEE ARTHROPLASTY;  Surgeon: Alta Corning, MD;  Location: [facility];  Service: Orthopedics;  Laterality: Left;   TOTAL KNEE ARTHROPLASTY Right 01/23/2015   Procedure: RIGHT TOTAL KNEE ARTHROPLASTY;  Surgeon: Alta Corning, MD;  Location: [facility];  Service: Orthopedics;  Laterality: Right;   UPPER GI ENDOSCOPY     VASECTOMY         Family History  Problem Relation Age of Onset   CAD Mother    Alzheimer's disease Mother     Social History   Tobacco Use   Smoking status: Never Smoker   Smokeless tobacco: Never Used  Substance Use Topics   Alcohol use: Yes    Alcohol/week: 3.0 standard  drinks    Types: 3 Standard drinks or equivalent per week    Comment: occasional   Drug use: No    Home Medications Prior to Admission medications   Medication Sig Start Date End Date Taking? Authorizing Provider  acetaminophen (TYLENOL) 325 MG tablet Take 2 tablets (650 mg total) by mouth every 4 (four) hours as needed for headache or mild pain. 12/30/16   Robbie Lis, MD  aspirin EC 81 MG tablet Take 81 mg by mouth daily.    [provider]  esomeprazole (NEXIUM) 20 MG capsule Take 20 mg by mouth daily before breakfast.    [provider]  lidocaine (LIDODERM) 5  % Place 1 patch onto the skin daily. Remove & Discard patch within 12 hours or as directed by MD 07/20/20   Randal Buba, Daeshon Grammatico, MD  lisinopril (ZESTRIL) 20 MG tablet TAKE 1 TABLET AT BEDTIME 06/14/20   Revankar, Reita Cliche, MD  methocarbamol (ROBAXIN) 500 MG tablet Take 1 tablet (500 mg total) by mouth 2 (two) times daily. 07/20/20   Tavian Callander, MD  Multiple Vitamin (MULTIVITAMIN WITH MINERALS) TABS tablet Take 1 tablet by mouth daily.    [provider]  nebivolol (BYSTOLIC) 5 MG tablet TAKE 1 TABLET EVERY DAY (NEED MD APPOINTMENT) 04/21/20   Revankar, Reita Cliche, MD  nitroGLYCERIN (NITROSTAT) 0.4 MG SL tablet PLACE 1 TABLET UNDER THE TONGUE EVERY 5 MINUTES AS NEEDED FOR CHEST PAIN. DO NOT EXCEED 3 DOSES IN 15 MINUTES 04/19/20   Revankar, Reita Cliche, MD  Omega-3 Fatty Acids (FISH OIL) 1000 MG CAPS Take 1,000 mg by mouth daily.    [provider]  predniSONE (DELTASONE) 20 MG tablet 3 tabs po day one, then 2 po daily x 4 days 07/20/20   Kayhan Boardley, MD  rosuvastatin (CRESTOR) 40 MG tablet TAKE 1 TABLET AT BEDTIME 06/14/20   Revankar, Reita Cliche, MD    Allergies    Patient has no known allergies.  Review of Systems   Review of Systems  Constitutional: Negative for diaphoresis, fatigue and fever.  HENT: Negative for congestion, ear pain, rhinorrhea and sore throat.   Eyes: Negative for visual disturbance.  Respiratory: Negative for cough, chest tightness, shortness of breath and wheezing.   Cardiovascular: Negative for chest pain, palpitations and leg swelling.  Gastrointestinal: Negative for abdominal pain, diarrhea, nausea and vomiting.  Genitourinary: Negative for difficulty urinating.  Musculoskeletal: Positive for neck pain. Negative for myalgias and neck stiffness.  Skin: Negative for rash.  Neurological: Negative for loss of consciousness and headaches.  Psychiatric/Behavioral: Negative for agitation.  All other systems reviewed and are negative.   Physical Exam Updated Vital  Signs BP (!) 151/87    Pulse 82    Temp 98.5 F (36.9 C) (Oral)    Resp 19    Ht 6\' 2"  (1.88 m)    Wt (!) 106.6 kg    SpO2 95%    BMI 30.17 kg/m   Physical Exam Vitals and nursing note reviewed.  Constitutional:      General: He is not in acute distress.    Appearance: Normal appearance.  HENT:     Head: Normocephalic and atraumatic.     Nose: Nose normal.  Eyes:     Conjunctiva/sclera: Conjunctivae normal.     Pupils: Pupils are equal, round, and reactive to light.  Neck:     Vascular: No carotid bruit.     Comments: Spasm on the right SCM Cardiovascular:     Rate  and Rhythm: Normal rate and regular rhythm.     Pulses: Normal pulses.     Heart sounds: Normal heart sounds.  Pulmonary:     Effort: Pulmonary effort is normal.     Breath sounds: Normal breath sounds.  Abdominal:     General: Abdomen is flat. Bowel sounds are normal.     Tenderness: There is no abdominal tenderness. There is no guarding or rebound.  Musculoskeletal:        General: Normal range of motion.     Cervical back: No rigidity.  Lymphadenopathy:     Cervical: No cervical adenopathy.  Skin:    General: Skin is warm and dry.     Capillary Refill: Capillary refill takes less than 2 seconds.  Neurological:     General: No focal deficit present.     Mental Status: He is alert and oriented to person, place, and time.     Deep Tendon Reflexes: Reflexes normal.  Psychiatric:        Mood and Affect: Mood normal.        Behavior: Behavior normal.     ED Results / Procedures / Treatments   Labs (all labs ordered are listed, but only abnormal results are displayed) Results for orders placed or performed during the hospital encounter of 07/20/20  CBC with Differential/Platelet  Result Value Ref Range   WBC 10.5 4.0 - 10.5 K/uL   RBC 5.01 4.22 - 5.81 MIL/uL   Hemoglobin 15.3 13.0 - 17.0 g/dL   HCT 44.4 39 - 52 %   MCV 88.6 80.0 - 100.0 fL   MCH 30.5 26.0 - 34.0 pg   MCHC 34.5 30.0 - 36.0 g/dL   RDW  12.2 11.5 - 15.5 %   Platelets 220 150 - 400 K/uL   nRBC 0.0 0.0 - 0.2 %   Neutrophils Relative % 81 %   Neutro Abs 8.5 (H) 1.7 - 7.7 K/uL   Lymphocytes Relative 10 %   Lymphs Abs 1.0 0.7 - 4.0 K/uL   Monocytes Relative 6 %   Monocytes Absolute 0.6 0 - 1 K/uL   Eosinophils Relative 2 %   Eosinophils Absolute 0.3 0 - 0 K/uL   Basophils Relative 1 %   Basophils Absolute 0.1 0 - 0 K/uL   Immature Granulocytes 0 %   Abs Immature Granulocytes 0.04 0.00 - 0.07 K/uL  Basic metabolic panel  Result Value Ref Range   Sodium 138 135 - 145 mmol/L   Potassium 3.8 3.5 - 5.1 mmol/L   Chloride 103 98 - 111 mmol/L   CO2 27 22 - 32 mmol/L   Glucose, Bld 116 (H) 70 - 99 mg/dL   BUN 10 8 - 23 mg/dL   Creatinine, Ser 0.92 0.61 - 1.24 mg/dL   Calcium 9.6 8.9 - 10.3 mg/dL   GFR calc non Af Amer >60 >60 mL/min   GFR calc Af Amer >60 >60 mL/min   Anion gap 8 5 - 15  Troponin I (High Sensitivity)  Result Value Ref Range   Troponin I (High Sensitivity) 3 <18 ng/L   DG Neck Soft Tissue  Result Date: 07/20/2020 CLINICAL DATA:  Pain and stiffness. EXAM: NECK SOFT TISSUES - 1+ VIEW COMPARISON:  CT 12/28/2016. FINDINGS: Soft tissue structures are unremarkable. Epiglottis and retropharyngeal space appear normal. Vascular calcification again noted. Cervical airway widely patent. Diffuse severe degenerative change cervical spine. No acute bony abnormality. IMPRESSION: Soft tissues of the neck appear unremarkable. No soft tissue swelling. Severe  degenerative changes cervical spine. Electronically Signed   By: Marcello Moores  Register   On: 07/20/2020 06:20   DG Chest 2 View  Result Date: 07/20/2020 CLINICAL DATA:  Stiffness and pain in the left neck EXAM: CHEST - 2 VIEW COMPARISON:  Chest CT 12/28/2016 FINDINGS: Mild scar-like density at the left base also seen on comparison CT. There is no edema, consolidation, effusion, or pneumothorax. Normal heart size and aortic contours. Coronary stenting. IMPRESSION: Mild  left-sided pulmonary scarring. Electronically Signed   By: Monte Fantasia M.D.   On: 07/20/2020 06:18    EKG EKG Interpretation  Date/Time:  Thursday July 20 2020 05:20:07 EDT Ventricular Rate:  82 PR Interval:    QRS Duration: 86 QT Interval:  372 QTC Calculation: 435 R Axis:   89 Text Interpretation: Sinus rhythm Baseline wander in lead(s) V2 Confirmed by Randal Buba, Nitasha Jewel ([id number]) on 07/20/2020 6:28:32 AM   Radiology DG Neck Soft Tissue  Result Date: 07/20/2020 CLINICAL DATA:  Pain and stiffness. EXAM: NECK SOFT TISSUES - 1+ VIEW COMPARISON:  CT 12/28/2016. FINDINGS: Soft tissue structures are unremarkable. Epiglottis and retropharyngeal space appear normal. Vascular calcification again noted. Cervical airway widely patent. Diffuse severe degenerative change cervical spine. No acute bony abnormality. IMPRESSION: Soft tissues of the neck appear unremarkable. No soft tissue swelling. Severe degenerative changes cervical spine. Electronically Signed   By: Marcello Moores  Register   On: 07/20/2020 06:20   DG Chest 2 View  Result Date: 07/20/2020 CLINICAL DATA:  Stiffness and pain in the left neck EXAM: CHEST - 2 VIEW COMPARISON:  Chest CT 12/28/2016 FINDINGS: Mild scar-like density at the left base also seen on comparison CT. There is no edema, consolidation, effusion, or pneumothorax. Normal heart size and aortic contours. Coronary stenting. IMPRESSION: Mild left-sided pulmonary scarring. Electronically Signed   By: Monte Fantasia M.D.   On: 07/20/2020 06:18    Procedures Procedures (including critical care time)  Medications Ordered in ED Medications  lidocaine (LIDODERM) 5 % 1 patch (1 patch Transdermal Patch Applied 07/20/20 0613)  predniSONE (DELTASONE) tablet 60 mg (60 mg Oral Given 07/20/20 0611)  methocarbamol (ROBAXIN) tablet 1,000 mg (1,000 mg Oral Given 07/20/20 4008)    ED Course  I have reviewed the triage vital signs and the nursing notes.  Pertinent labs & imaging results that  were available during my care of the patient were reviewed by me and considered in my medical decision making (see chart for details).    Patient ruled out for MI in the ED.  Symptoms are consistent with muscle spasm.  I have begun treatment for this in the Ed and referred the patient to Dr. Barbaraann Barthel.   Jacob Parker was evaluated in Emergency Department on 07/20/2020 for the symptoms described in the history of present illness. He was evaluated in the context of the global COVID-19 pandemic, which necessitated consideration that the patient might be at risk for infection with the SARS-CoV-2 virus that causes COVID-19. Institutional protocols and algorithms that pertain to the evaluation of patients at risk for COVID-19 are in a state of rapid change based on information released by regulatory bodies including the CDC and federal and state organizations. These policies and algorithms were followed during the patient's care in the ED.  Final Clinical Impression(s) / ED Diagnoses Final diagnoses:  Neck muscle spasm  Cervical spine arthritis   Return for intractable cough, coughing up blood,fevers >100.4 unrelieved by medication, shortness of breath, intractable vomiting, chest pain, shortness of breath, weakness,numbness, changes in  speech, facial asymmetry,abdominal pain, passing out,Inability to tolerate liquids or food, cough, altered mental status or any concerns. No signs of systemic illness or infection. The patient is nontoxic-appearing on exam and vital signs are within normal limits.   I have reviewed the triage vital signs and the nursing notes. Pertinent labs &imaging results that were available during my care of the patient were reviewed by me and considered in my medical decision making (see chart for details).After history, exam, and medical workup I feel the patient has beenappropriately medically screened and is safe for discharge home. Pertinent diagnoses were discussed  with the patient. Patient was given return precautions.   Rx / DC Orders ED Discharge Orders         Ordered    lidocaine (LIDODERM) 5 %  Every 24 hours     Discontinue  Reprint     07/20/20 0647    predniSONE (DELTASONE) 20 MG tablet     Discontinue  Reprint     07/20/20 0647    methocarbamol (ROBAXIN) 500 MG tablet  2 times daily     Discontinue  Reprint     07/20/20 0647           Katalina Magri, MD 07/20/20 3893

## 2020-07-24 DIAGNOSIS — M5136 Other intervertebral disc degeneration, lumbar region: Secondary | ICD-10-CM | POA: Diagnosis not present

## 2020-07-24 DIAGNOSIS — M5032 Other cervical disc degeneration, mid-cervical region, unspecified level: Secondary | ICD-10-CM | POA: Diagnosis not present

## 2020-07-24 DIAGNOSIS — M9903 Segmental and somatic dysfunction of lumbar region: Secondary | ICD-10-CM | POA: Diagnosis not present

## 2020-07-24 DIAGNOSIS — M9901 Segmental and somatic dysfunction of cervical region: Secondary | ICD-10-CM | POA: Diagnosis not present

## 2020-07-24 DIAGNOSIS — M9902 Segmental and somatic dysfunction of thoracic region: Secondary | ICD-10-CM | POA: Diagnosis not present

## 2020-08-01 ENCOUNTER — Ambulatory Visit: Payer: Medicare HMO | Admitting: Family Medicine

## 2020-08-01 ENCOUNTER — Encounter: Payer: Self-pay | Admitting: Family Medicine

## 2020-08-01 ENCOUNTER — Other Ambulatory Visit: Payer: Self-pay

## 2020-08-01 VITALS — BP 126/78 | Ht 74.0 in | Wt 235.0 lb

## 2020-08-01 DIAGNOSIS — M542 Cervicalgia: Secondary | ICD-10-CM

## 2020-08-01 NOTE — Patient Instructions (Addendum)
You have a cervical strain. Topical voltaren gel up to 4 times a day for pain and inflammation. Tylenol 500mg  1-2 tabs three times a day as needed. Robaxin as needed for muscle spasms (do not drive with this if it makes you sleepy). Simple range of motion exercises within limits of pain to prevent further stiffness. Start physical therapy for stretching, exercises, traction, and modalities. Heat 15 minutes at a time 3-4 times a day to help with spasms. Watch head position when on computers, texting, when sleeping in bed - should in line with back to prevent further spasm. Follow up with me in 1 month but call me sooner if you're struggling.  For the leg length discrepancy, consider calling [facility]:  Therapist, music and [place] [address] [phone]
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
## 2020-08-04 DIAGNOSIS — Z03818 Encounter for observation for suspected exposure to other biological agents ruled out: Secondary | ICD-10-CM | POA: Diagnosis not present

## 2020-08-04 DIAGNOSIS — Z20822 Contact with and (suspected) exposure to covid-19: Secondary | ICD-10-CM | POA: Diagnosis not present

## 2020-08-07 DIAGNOSIS — R12 Heartburn: Secondary | ICD-10-CM | POA: Diagnosis not present

## 2020-08-07 DIAGNOSIS — R1310 Dysphagia, unspecified: Secondary | ICD-10-CM | POA: Diagnosis not present

## 2020-08-07 DIAGNOSIS — K209 Esophagitis, unspecified without bleeding: Secondary | ICD-10-CM | POA: Diagnosis not present

## 2020-08-07 DIAGNOSIS — K317 Polyp of stomach and duodenum: Secondary | ICD-10-CM | POA: Diagnosis not present

## 2020-08-08 DIAGNOSIS — R509 Fever, unspecified: Secondary | ICD-10-CM | POA: Diagnosis not present

## 2020-08-08 DIAGNOSIS — Z03818 Encounter for observation for suspected exposure to other biological agents ruled out: Secondary | ICD-10-CM | POA: Diagnosis not present

## 2020-08-08 DIAGNOSIS — B349 Viral infection, unspecified: Secondary | ICD-10-CM | POA: Diagnosis not present

## 2020-08-08 DIAGNOSIS — R05 Cough: Secondary | ICD-10-CM | POA: Diagnosis not present

## 2020-08-09 DIAGNOSIS — K209 Esophagitis, unspecified without bleeding: Secondary | ICD-10-CM | POA: Diagnosis not present

## 2020-08-16 DIAGNOSIS — G4733 Obstructive sleep apnea (adult) (pediatric): Secondary | ICD-10-CM | POA: Diagnosis not present

## 2020-08-17 DIAGNOSIS — M25572 Pain in left ankle and joints of left foot: Secondary | ICD-10-CM | POA: Diagnosis not present

## 2020-08-17 DIAGNOSIS — M79672 Pain in left foot: Secondary | ICD-10-CM | POA: Diagnosis not present

## 2020-08-19 DIAGNOSIS — G4733 Obstructive sleep apnea (adult) (pediatric): Secondary | ICD-10-CM | POA: Diagnosis not present

## 2020-08-21 DIAGNOSIS — M5032 Other cervical disc degeneration, mid-cervical region, unspecified level: Secondary | ICD-10-CM | POA: Diagnosis not present

## 2020-08-21 DIAGNOSIS — M9901 Segmental and somatic dysfunction of cervical region: Secondary | ICD-10-CM | POA: Diagnosis not present

## 2020-08-21 DIAGNOSIS — M5136 Other intervertebral disc degeneration, lumbar region: Secondary | ICD-10-CM | POA: Diagnosis not present

## 2020-08-21 DIAGNOSIS — M9903 Segmental and somatic dysfunction of lumbar region: Secondary | ICD-10-CM | POA: Diagnosis not present

## 2020-08-21 DIAGNOSIS — M9902 Segmental and somatic dysfunction of thoracic region: Secondary | ICD-10-CM | POA: Diagnosis not present

## 2020-08-31 DIAGNOSIS — E782 Mixed hyperlipidemia: Secondary | ICD-10-CM | POA: Diagnosis not present

## 2020-08-31 DIAGNOSIS — I252 Old myocardial infarction: Secondary | ICD-10-CM | POA: Diagnosis not present

## 2020-08-31 DIAGNOSIS — I10 Essential (primary) hypertension: Secondary | ICD-10-CM | POA: Diagnosis not present

## 2020-08-31 DIAGNOSIS — N400 Enlarged prostate without lower urinary tract symptoms: Secondary | ICD-10-CM | POA: Diagnosis not present

## 2020-08-31 DIAGNOSIS — M1990 Unspecified osteoarthritis, unspecified site: Secondary | ICD-10-CM | POA: Diagnosis not present

## 2020-08-31 DIAGNOSIS — I2510 Atherosclerotic heart disease of native coronary artery without angina pectoris: Secondary | ICD-10-CM | POA: Diagnosis not present

## 2020-09-04 ENCOUNTER — Ambulatory Visit: Payer: Medicare HMO | Admitting: Family Medicine

## 2020-09-19 DIAGNOSIS — G4733 Obstructive sleep apnea (adult) (pediatric): Secondary | ICD-10-CM | POA: Diagnosis not present

## 2020-09-25 ENCOUNTER — Other Ambulatory Visit: Payer: Self-pay | Admitting: Cardiology

## 2020-09-25 DIAGNOSIS — M9901 Segmental and somatic dysfunction of cervical region: Secondary | ICD-10-CM | POA: Diagnosis not present

## 2020-09-25 DIAGNOSIS — M5032 Other cervical disc degeneration, mid-cervical region, unspecified level: Secondary | ICD-10-CM | POA: Diagnosis not present

## 2020-09-25 DIAGNOSIS — M9903 Segmental and somatic dysfunction of lumbar region: Secondary | ICD-10-CM | POA: Diagnosis not present

## 2020-09-25 DIAGNOSIS — M5136 Other intervertebral disc degeneration, lumbar region: Secondary | ICD-10-CM | POA: Diagnosis not present

## 2020-09-25 DIAGNOSIS — M9902 Segmental and somatic dysfunction of thoracic region: Secondary | ICD-10-CM | POA: Diagnosis not present

## 2020-09-25 MED ORDER — NEBIVOLOL HCL 5 MG PO TABS: ORAL_TABLET | ORAL | 1 refills | Status: DC

## 2020-09-25 NOTE — Telephone Encounter (Signed)
*  STAT* If patient is at the pharmacy, call can be transferred to refill team.   1. Which medications need to be refilled? (please list name of each medication and dose if known) nebivolol (BYSTOLIC) 5 MG tablet [[id number]]   2. Which pharmacy/location (including street and city if local pharmacy) is medication to be sent to? [facility], [place] [address]  Phone:  [phone] Fax:  [phone]   3. Do they need a 30 day or 90 day supply? [place]

## 2020-09-25 NOTE — Telephone Encounter (Signed)
Refill sent in per request.  

## 2020-09-29 ENCOUNTER — Other Ambulatory Visit: Payer: Self-pay

## 2020-09-29 DIAGNOSIS — I10 Essential (primary) hypertension: Secondary | ICD-10-CM | POA: Insufficient documentation

## 2020-09-29 DIAGNOSIS — M1990 Unspecified osteoarthritis, unspecified site: Secondary | ICD-10-CM | POA: Insufficient documentation

## 2020-09-29 DIAGNOSIS — Z85828 Personal history of other malignant neoplasm of skin: Secondary | ICD-10-CM | POA: Insufficient documentation

## 2020-09-29 DIAGNOSIS — G4730 Sleep apnea, unspecified: Secondary | ICD-10-CM | POA: Insufficient documentation

## 2020-10-02 ENCOUNTER — Ambulatory Visit: Payer: Medicare HMO | Admitting: Cardiology

## 2020-10-02 ENCOUNTER — Other Ambulatory Visit: Payer: Self-pay

## 2020-10-02 ENCOUNTER — Encounter: Payer: Self-pay | Admitting: Cardiology

## 2020-10-02 VITALS — BP 140/86 | HR 84 | Ht 75.0 in | Wt 233.1 lb

## 2020-10-02 DIAGNOSIS — E782 Mixed hyperlipidemia: Secondary | ICD-10-CM

## 2020-10-02 DIAGNOSIS — I2510 Atherosclerotic heart disease of native coronary artery without angina pectoris: Secondary | ICD-10-CM | POA: Diagnosis not present

## 2020-10-02 DIAGNOSIS — I10 Essential (primary) hypertension: Secondary | ICD-10-CM

## 2020-10-02 DIAGNOSIS — G4733 Obstructive sleep apnea (adult) (pediatric): Secondary | ICD-10-CM

## 2020-10-02 NOTE — Patient Instructions (Addendum)

## 2020-10-02 NOTE — Progress Notes (Signed)
Cardiology Office Note:    Date:  10/02/2020   ID:  Jacob Parker, DOB 1951-10-10, MRN [id number]  PCP:  Leighton Ruff, MD  Cardiologist:  Jenean Lindau, MD   Referring MD: Leighton Ruff, MD    ASSESSMENT:    1. Coronary artery disease involving native coronary artery of native heart without angina pectoris   2. Essential hypertension   3. Mixed hyperlipidemia   4. Obstructive sleep apnea    PLAN:    In order of problems listed above:  1. Secondary prevention stressed with the patient.  Importance of compliance with diet medication stressed and he vocalized understanding.  Importance of regular exercise stressed and advised him to use the bicycle at least half an hour a day 5 days a week and he promises to do so.  Weight reduction was stressed and diet was emphasized 2. Essential hypertension: Blood pressure is stable and diet was emphasized 3. Mixed dyslipidemia: Lipids will be checked today.  Diet was emphasized. 4. Sleep apnea: Sleep health issues were discussed and he is using his machine regularly. 5. Patient will be seen in follow-up appointment in 6 months or earlier if the patient has any concerns    Medication Adjustments/Labs and Tests Ordered: Current medicines are reviewed at length with the patient today.  Concerns regarding medicines are outlined above.  No orders of the defined types were placed in this encounter.  No orders of the defined types were placed in this encounter.    No chief complaint on file.    History of Present Illness:    Jacob Parker is a 69 y.o. male.  Patient has past medical history of coronary artery disease, essential hypertension and dyslipidemia.  He has sleep apnea and uses the sleep apnea machine meticulously.  He denies any chest pain orthopnea or PND.  He leads a sedentary lifestyle.  At the time of my evaluation, the patient is alert awake oriented and in no distress.  Past Medical History:  Diagnosis  Date  . Bilateral hearing loss 03/08/2016  . Bilateral impacted cerumen 03/08/2016  . Coronary artery disease 2011   has 2 stents in [place]; as if 10/2014 followed by Dr. Wynonia Lawman in [place]   . DJD (degenerative joint disease)   . Essential hypertension 12/28/2016  . GERD (gastroesophageal reflux disease)   . History of skin cancer 1981 / 2015  . Hyperlipidemia   . Hypertension   . NSTEMI (non-ST elevated myocardial infarction) ([place]) 12/29/2016  . Obstructive sleep apnea 03/08/2016  . Primary osteoarthritis of left knee 11/16/2014  . Primary osteoarthritis of right knee 01/23/2015  . Sleep apnea    uses c pap  . Stricture esophagus     Past Surgical History:  Procedure Laterality Date  . APPENDECTOMY    . CARDIAC CATHETERIZATION N/A 12/30/2016   Procedure: Left Heart Cath and Coronary Angiography;  Surgeon: Lorretta Harp, MD;  Location: [facility];  Service: Cardiovascular;  Laterality: N/A;  . CARDIAC CATHETERIZATION N/A 12/30/2016   Procedure: Coronary Stent Intervention;  Surgeon: Lorretta Harp, MD;  Location: [facility];  Service: Cardiovascular;  Laterality: N/A;  . COLONOSCOPY    . CORONARY ANGIOPLASTY WITH STENT PLACEMENT  2011    x 2 stents (Xience RCA and DIAG stent 2011, [place])  . ESOPHAGEAL MANOMETRY N/A 07/12/2020   Procedure: ESOPHAGEAL MANOMETRY (EM);  Surgeon: Wilford Corner, MD;  Location: [facility];  Service: Endoscopy;  Laterality: N/A;  . FRACTURE SURGERY  1985   l foot fx / l leg  . HERNIA REPAIR  1973  . TOTAL KNEE ARTHROPLASTY Left 11/16/2014   Procedure: LEFT TOTAL KNEE ARTHROPLASTY;  Surgeon: Alta Corning, MD;  Location: [facility];  Service: Orthopedics;  Laterality: Left;  . TOTAL KNEE ARTHROPLASTY Right 01/23/2015   Procedure: RIGHT TOTAL KNEE ARTHROPLASTY;  Surgeon: Alta Corning, MD;  Location: [facility];  Service: Orthopedics;  Laterality: Right;  . UPPER GI ENDOSCOPY    . VASECTOMY      Current Medications: Current Meds  Medication Sig   . acetaminophen (TYLENOL) 325 MG tablet Take 2 tablets (650 mg total) by mouth every 4 (four) hours as needed for headache or mild pain.  Marland Kitchen aspirin EC 81 MG tablet Take 81 mg by mouth daily.  Marland Kitchen esomeprazole (NEXIUM) 20 MG capsule Take 20 mg by mouth daily before breakfast.  . lisinopril (ZESTRIL) 20 MG tablet TAKE 1 TABLET AT BEDTIME  . Multiple Vitamin (MULTIVITAMIN WITH MINERALS) TABS tablet Take 1 tablet by mouth daily.  . nebivolol (BYSTOLIC) 5 MG tablet TAKE 1 TABLET EVERY DAY (NEED MD APPOINTMENT)  . nitroGLYCERIN (NITROSTAT) 0.4 MG SL tablet PLACE 1 TABLET UNDER THE TONGUE EVERY 5 MINUTES AS NEEDED FOR CHEST PAIN. DO NOT EXCEED 3 DOSES IN 15 MINUTES  . Omega-3 Fatty Acids (FISH OIL) 1000 MG CAPS Take 1,000 mg by mouth daily.  . rosuvastatin (CRESTOR) 40 MG tablet TAKE 1 TABLET AT BEDTIME     Allergies:   Patient has no known allergies.   Social History   Socioeconomic History  . Marital status: Married    Spouse name: Not on file  . Number of children: Not on file  . Years of education: Not on file  . Highest education level: Not on file  Occupational History  . Not on file  Tobacco Use  . Smoking status: Never Smoker  . Smokeless tobacco: Never Used  Substance and Sexual Activity  . Alcohol use: Yes    Alcohol/week: 3.0 standard drinks    Types: 3 Standard drinks or equivalent per week    Comment: occasional  . Drug use: No  . Sexual activity: Yes  Other Topics Concern  . Not on file  Social History Narrative  . Not on file   Social Determinants of Health   Financial Resource Strain:   . Difficulty of Paying Living Expenses: Not on file  Food Insecurity:   . Worried About Charity fundraiser in the Last Year: Not on file  . Ran Out of Food in the Last Year: Not on file  Transportation Needs:   . Lack of Transportation (Medical): Not on file  . Lack of Transportation (Non-Medical): Not on file  Physical Activity:   . Days of Exercise per Week: Not on file  .  Minutes of Exercise per Session: Not on file  Stress:   . Feeling of Stress : Not on file  Social Connections:   . Frequency of Communication with Friends and Family: Not on file  . Frequency of Social Gatherings with Friends and Family: Not on file  . Attends Religious Services: Not on file  . Active Member of Clubs or Organizations: Not on file  . Attends Archivist Meetings: Not on file  . Marital Status: Not on file     Family History: The patient's family history includes Alzheimer's disease in his mother; CAD in his mother.  ROS:   Please see the history of  present illness.    All other systems reviewed and are negative.  EKGs/Labs/Other Studies Reviewed:    The following studies were reviewed today: EKG reveals sinus rhythm and nonspecific ST-T changes.   Recent Labs: 07/20/2020: BUN 10; Creatinine, Ser 0.92; Hemoglobin 15.3; Platelets 220; Potassium 3.8; Sodium 138  Recent Lipid Panel    Component Value Date/Time   CHOL 156 12/28/2016 1630   TRIG 150 (H) 12/28/2016 1630   HDL 47 12/28/2016 1630   CHOLHDL 3.3 12/28/2016 1630   VLDL 30 12/28/2016 1630   LDLCALC 79 12/28/2016 1630    Physical Exam:    VS:  BP 140/86   Pulse 84   Ht 6\' 3"  (1.905 m)   Wt 233 lb 1.3 oz (105.7 kg)   SpO2 95%   BMI 29.13 kg/m     Wt Readings from Last 3 Encounters:  10/02/20 233 lb 1.3 oz (105.7 kg)  08/01/20 235 lb (106.6 kg)  07/20/20 (!) 235 lb (106.6 kg)     GEN: Patient is in no acute distress HEENT: Normal NECK: No JVD; No carotid bruits LYMPHATICS: No lymphadenopathy CARDIAC: Hear sounds regular, 2/6 systolic murmur at the apex. RESPIRATORY:  Clear to auscultation without rales, wheezing or rhonchi  ABDOMEN: Soft, non-tender, non-distended MUSCULOSKELETAL:  No edema; No deformity  SKIN: Warm and dry NEUROLOGIC:  Alert and oriented x 3 PSYCHIATRIC:  Normal affect   Signed, Jenean Lindau, MD  10/02/2020 9:26 AM    [facility]

## 2020-10-03 ENCOUNTER — Telehealth: Payer: Self-pay | Admitting: Cardiology

## 2020-10-03 LAB — CBC WITH DIFFERENTIAL/PLATELET
Basophils Absolute: 0.1 10*3/uL (ref 0.0–0.2)
Basos: 1 %
EOS (ABSOLUTE): 0.4 10*3/uL (ref 0.0–0.4)
Eos: 5 %
Hematocrit: 47.6 % (ref 37.5–51.0)
Hemoglobin: 16 g/dL (ref 13.0–17.7)
Immature Grans (Abs): 0 10*3/uL (ref 0.0–0.1)
Immature Granulocytes: 0 %
Lymphocytes Absolute: 1.6 10*3/uL (ref 0.7–3.1)
Lymphs: 17 %
MCH: 30.4 pg (ref 26.6–33.0)
MCHC: 33.6 g/dL (ref 31.5–35.7)
MCV: 90 fL (ref 79–97)
Monocytes Absolute: 0.7 10*3/uL (ref 0.1–0.9)
Monocytes: 8 %
Neutrophils Absolute: 6.2 10*3/uL (ref 1.4–7.0)
Neutrophils: 69 %
Platelets: 227 10*3/uL (ref 150–450)
RBC: 5.27 x10E6/uL (ref 4.14–5.80)
RDW: 13.5 % (ref 11.6–15.4)
WBC: 9 10*3/uL (ref 3.4–10.8)

## 2020-10-03 LAB — HEPATIC FUNCTION PANEL
ALT: 40 IU/L (ref 0–44)
AST: 29 IU/L (ref 0–40)
Albumin: 4.6 g/dL (ref 3.7–4.7)
Alkaline Phosphatase: 136 IU/L — ABNORMAL HIGH (ref 44–121)
Bilirubin Total: 0.6 mg/dL (ref 0.0–1.2)
Bilirubin, Direct: 0.16 mg/dL (ref 0.00–0.40)
Total Protein: 7.3 g/dL (ref 6.0–8.5)

## 2020-10-03 LAB — LIPID PANEL
Chol/HDL Ratio: 4.2 ratio (ref 0.0–5.0)
Cholesterol, Total: 180 mg/dL (ref 100–199)
HDL: 43 mg/dL (ref >39)
LDL Chol Calc (NIH): 93 mg/dL (ref 0–99)
Triglycerides: 260 mg/dL — ABNORMAL HIGH (ref 0–149)
VLDL Cholesterol Cal: 44 mg/dL — ABNORMAL HIGH (ref 5–40)

## 2020-10-03 LAB — BASIC METABOLIC PANEL
BUN/Creatinine Ratio: 11 (ref 10–24)
BUN: 14 mg/dL (ref 8–27)
CO2: 26 mmol/L (ref 20–29)
Calcium: 10 mg/dL (ref 8.6–10.2)
Chloride: 100 mmol/L (ref 96–106)
Creatinine, Ser: 1.25 mg/dL (ref 0.76–1.27)
GFR calc Af Amer: 67 mL/min/{1.73_m2} (ref >59)
GFR calc non Af Amer: 58 mL/min/{1.73_m2} — ABNORMAL LOW (ref >59)
Glucose: 87 mg/dL (ref 65–99)
Potassium: 5.1 mmol/L (ref 3.5–5.2)
Sodium: 140 mmol/L (ref 134–144)

## 2020-10-03 LAB — TSH: TSH: 1.34 u[IU]/mL (ref 0.450–4.500)

## 2020-10-04 NOTE — Addendum Note (Signed)
Addended by: Truddie Hidden on: 10/04/2020 01:56 PM   Modules accepted: Orders

## 2020-10-05 DIAGNOSIS — G4733 Obstructive sleep apnea (adult) (pediatric): Secondary | ICD-10-CM | POA: Diagnosis not present

## 2020-10-19 DIAGNOSIS — G4733 Obstructive sleep apnea (adult) (pediatric): Secondary | ICD-10-CM | POA: Diagnosis not present

## 2020-10-27 DIAGNOSIS — M25372 Other instability, left ankle: Secondary | ICD-10-CM | POA: Diagnosis not present

## 2020-10-30 DIAGNOSIS — M5032 Other cervical disc degeneration, mid-cervical region, unspecified level: Secondary | ICD-10-CM | POA: Diagnosis not present

## 2020-10-30 DIAGNOSIS — M9903 Segmental and somatic dysfunction of lumbar region: Secondary | ICD-10-CM | POA: Diagnosis not present

## 2020-10-30 DIAGNOSIS — M5136 Other intervertebral disc degeneration, lumbar region: Secondary | ICD-10-CM | POA: Diagnosis not present

## 2020-10-30 DIAGNOSIS — M9901 Segmental and somatic dysfunction of cervical region: Secondary | ICD-10-CM | POA: Diagnosis not present

## 2020-10-30 DIAGNOSIS — M9902 Segmental and somatic dysfunction of thoracic region: Secondary | ICD-10-CM | POA: Diagnosis not present

## 2020-11-02 DIAGNOSIS — D485 Neoplasm of uncertain behavior of skin: Secondary | ICD-10-CM | POA: Diagnosis not present

## 2020-11-02 DIAGNOSIS — L821 Other seborrheic keratosis: Secondary | ICD-10-CM | POA: Diagnosis not present

## 2020-11-02 DIAGNOSIS — D1801 Hemangioma of skin and subcutaneous tissue: Secondary | ICD-10-CM | POA: Diagnosis not present

## 2020-11-02 DIAGNOSIS — L820 Inflamed seborrheic keratosis: Secondary | ICD-10-CM | POA: Diagnosis not present

## 2020-11-02 DIAGNOSIS — C44629 Squamous cell carcinoma of skin of left upper limb, including shoulder: Secondary | ICD-10-CM | POA: Diagnosis not present

## 2020-11-02 DIAGNOSIS — D225 Melanocytic nevi of trunk: Secondary | ICD-10-CM | POA: Diagnosis not present

## 2020-11-02 DIAGNOSIS — L570 Actinic keratosis: Secondary | ICD-10-CM | POA: Diagnosis not present

## 2020-11-02 DIAGNOSIS — L300 Nummular dermatitis: Secondary | ICD-10-CM | POA: Diagnosis not present

## 2020-11-19 DIAGNOSIS — G4733 Obstructive sleep apnea (adult) (pediatric): Secondary | ICD-10-CM | POA: Diagnosis not present

## 2020-11-27 DIAGNOSIS — M5136 Other intervertebral disc degeneration, lumbar region: Secondary | ICD-10-CM | POA: Diagnosis not present

## 2020-11-27 DIAGNOSIS — M5032 Other cervical disc degeneration, mid-cervical region, unspecified level: Secondary | ICD-10-CM | POA: Diagnosis not present

## 2020-11-27 DIAGNOSIS — M9902 Segmental and somatic dysfunction of thoracic region: Secondary | ICD-10-CM | POA: Diagnosis not present

## 2020-11-27 DIAGNOSIS — M9903 Segmental and somatic dysfunction of lumbar region: Secondary | ICD-10-CM | POA: Diagnosis not present

## 2020-11-27 DIAGNOSIS — M9901 Segmental and somatic dysfunction of cervical region: Secondary | ICD-10-CM | POA: Diagnosis not present

## 2020-12-12 DIAGNOSIS — I10 Essential (primary) hypertension: Secondary | ICD-10-CM | POA: Diagnosis not present

## 2020-12-12 DIAGNOSIS — M1990 Unspecified osteoarthritis, unspecified site: Secondary | ICD-10-CM | POA: Diagnosis not present

## 2020-12-12 DIAGNOSIS — K219 Gastro-esophageal reflux disease without esophagitis: Secondary | ICD-10-CM | POA: Diagnosis not present

## 2020-12-12 DIAGNOSIS — I2510 Atherosclerotic heart disease of native coronary artery without angina pectoris: Secondary | ICD-10-CM | POA: Diagnosis not present

## 2020-12-12 DIAGNOSIS — I252 Old myocardial infarction: Secondary | ICD-10-CM | POA: Diagnosis not present

## 2020-12-12 DIAGNOSIS — N400 Enlarged prostate without lower urinary tract symptoms: Secondary | ICD-10-CM | POA: Diagnosis not present

## 2020-12-12 DIAGNOSIS — E782 Mixed hyperlipidemia: Secondary | ICD-10-CM | POA: Diagnosis not present

## 2020-12-19 DIAGNOSIS — G4733 Obstructive sleep apnea (adult) (pediatric): Secondary | ICD-10-CM | POA: Diagnosis not present

## 2020-12-25 DIAGNOSIS — Z1159 Encounter for screening for other viral diseases: Secondary | ICD-10-CM | POA: Diagnosis not present

## 2020-12-28 DIAGNOSIS — K3189 Other diseases of stomach and duodenum: Secondary | ICD-10-CM | POA: Diagnosis not present

## 2020-12-28 DIAGNOSIS — K200 Eosinophilic esophagitis: Secondary | ICD-10-CM | POA: Diagnosis not present

## 2020-12-28 DIAGNOSIS — K2289 Other specified disease of esophagus: Secondary | ICD-10-CM | POA: Diagnosis not present

## 2020-12-28 DIAGNOSIS — K219 Gastro-esophageal reflux disease without esophagitis: Secondary | ICD-10-CM | POA: Diagnosis not present

## 2021-01-03 DIAGNOSIS — K219 Gastro-esophageal reflux disease without esophagitis: Secondary | ICD-10-CM | POA: Diagnosis not present

## 2021-01-03 DIAGNOSIS — K200 Eosinophilic esophagitis: Secondary | ICD-10-CM | POA: Diagnosis not present

## 2021-01-09 DIAGNOSIS — G4733 Obstructive sleep apnea (adult) (pediatric): Secondary | ICD-10-CM | POA: Diagnosis not present

## 2021-01-15 DIAGNOSIS — M5032 Other cervical disc degeneration, mid-cervical region, unspecified level: Secondary | ICD-10-CM | POA: Diagnosis not present

## 2021-01-15 DIAGNOSIS — M5136 Other intervertebral disc degeneration, lumbar region: Secondary | ICD-10-CM | POA: Diagnosis not present

## 2021-01-15 DIAGNOSIS — M9901 Segmental and somatic dysfunction of cervical region: Secondary | ICD-10-CM | POA: Diagnosis not present

## 2021-01-15 DIAGNOSIS — M9902 Segmental and somatic dysfunction of thoracic region: Secondary | ICD-10-CM | POA: Diagnosis not present

## 2021-01-15 DIAGNOSIS — M9903 Segmental and somatic dysfunction of lumbar region: Secondary | ICD-10-CM | POA: Diagnosis not present

## 2021-01-19 DIAGNOSIS — G4733 Obstructive sleep apnea (adult) (pediatric): Secondary | ICD-10-CM | POA: Diagnosis not present

## 2021-02-02 DIAGNOSIS — L819 Disorder of pigmentation, unspecified: Secondary | ICD-10-CM | POA: Diagnosis not present

## 2021-02-02 DIAGNOSIS — L570 Actinic keratosis: Secondary | ICD-10-CM | POA: Diagnosis not present

## 2021-02-02 DIAGNOSIS — C44629 Squamous cell carcinoma of skin of left upper limb, including shoulder: Secondary | ICD-10-CM | POA: Diagnosis not present

## 2021-02-09 ENCOUNTER — Other Ambulatory Visit: Payer: Self-pay | Admitting: Cardiology

## 2021-02-12 NOTE — Telephone Encounter (Signed)
Rx refill sent to pharmacy. 

## 2021-02-15 DIAGNOSIS — N400 Enlarged prostate without lower urinary tract symptoms: Secondary | ICD-10-CM | POA: Diagnosis not present

## 2021-02-15 DIAGNOSIS — I10 Essential (primary) hypertension: Secondary | ICD-10-CM | POA: Diagnosis not present

## 2021-02-15 DIAGNOSIS — I2510 Atherosclerotic heart disease of native coronary artery without angina pectoris: Secondary | ICD-10-CM | POA: Diagnosis not present

## 2021-02-15 DIAGNOSIS — M1990 Unspecified osteoarthritis, unspecified site: Secondary | ICD-10-CM | POA: Diagnosis not present

## 2021-02-15 DIAGNOSIS — E782 Mixed hyperlipidemia: Secondary | ICD-10-CM | POA: Diagnosis not present

## 2021-02-15 DIAGNOSIS — I252 Old myocardial infarction: Secondary | ICD-10-CM | POA: Diagnosis not present

## 2021-02-15 DIAGNOSIS — K219 Gastro-esophageal reflux disease without esophagitis: Secondary | ICD-10-CM | POA: Diagnosis not present

## 2021-02-19 DIAGNOSIS — G4733 Obstructive sleep apnea (adult) (pediatric): Secondary | ICD-10-CM | POA: Diagnosis not present

## 2021-02-22 DIAGNOSIS — C44629 Squamous cell carcinoma of skin of left upper limb, including shoulder: Secondary | ICD-10-CM | POA: Diagnosis not present

## 2021-03-05 DIAGNOSIS — M5136 Other intervertebral disc degeneration, lumbar region: Secondary | ICD-10-CM | POA: Diagnosis not present

## 2021-03-05 DIAGNOSIS — M9903 Segmental and somatic dysfunction of lumbar region: Secondary | ICD-10-CM | POA: Diagnosis not present

## 2021-03-05 DIAGNOSIS — M9902 Segmental and somatic dysfunction of thoracic region: Secondary | ICD-10-CM | POA: Diagnosis not present

## 2021-03-05 DIAGNOSIS — M5032 Other cervical disc degeneration, mid-cervical region, unspecified level: Secondary | ICD-10-CM | POA: Diagnosis not present

## 2021-03-05 DIAGNOSIS — M9901 Segmental and somatic dysfunction of cervical region: Secondary | ICD-10-CM | POA: Diagnosis not present

## 2021-03-19 ENCOUNTER — Other Ambulatory Visit: Payer: Self-pay

## 2021-03-19 DIAGNOSIS — G4733 Obstructive sleep apnea (adult) (pediatric): Secondary | ICD-10-CM | POA: Diagnosis not present

## 2021-03-19 MED ORDER — LISINOPRIL 20 MG PO TABS: 20.0000 mg | ORAL_TABLET | Freq: Every day | ORAL | 2 refills | Status: DC

## 2021-03-19 MED ORDER — ROSUVASTATIN CALCIUM 40 MG PO TABS: 40.0000 mg | ORAL_TABLET | Freq: Every day | ORAL | 2 refills | Status: DC

## 2021-03-19 NOTE — Telephone Encounter (Signed)
Lisinopril 20 mg # 90 tablets with 2 additional refills to [facility]

## 2021-03-19 NOTE — Telephone Encounter (Signed)
Rosuvastatin 40 mg # 90 tablets with 2 additional refills sent to [facility]

## 2021-04-05 DIAGNOSIS — N400 Enlarged prostate without lower urinary tract symptoms: Secondary | ICD-10-CM | POA: Diagnosis not present

## 2021-04-05 DIAGNOSIS — M1990 Unspecified osteoarthritis, unspecified site: Secondary | ICD-10-CM | POA: Diagnosis not present

## 2021-04-05 DIAGNOSIS — I2510 Atherosclerotic heart disease of native coronary artery without angina pectoris: Secondary | ICD-10-CM | POA: Diagnosis not present

## 2021-04-05 DIAGNOSIS — I252 Old myocardial infarction: Secondary | ICD-10-CM | POA: Diagnosis not present

## 2021-04-05 DIAGNOSIS — K219 Gastro-esophageal reflux disease without esophagitis: Secondary | ICD-10-CM | POA: Diagnosis not present

## 2021-04-05 DIAGNOSIS — E782 Mixed hyperlipidemia: Secondary | ICD-10-CM | POA: Diagnosis not present

## 2021-04-05 DIAGNOSIS — I10 Essential (primary) hypertension: Secondary | ICD-10-CM | POA: Diagnosis not present

## 2021-04-09 DIAGNOSIS — G4733 Obstructive sleep apnea (adult) (pediatric): Secondary | ICD-10-CM | POA: Diagnosis not present

## 2021-04-19 DIAGNOSIS — G4733 Obstructive sleep apnea (adult) (pediatric): Secondary | ICD-10-CM | POA: Diagnosis not present

## 2021-04-23 DIAGNOSIS — M9902 Segmental and somatic dysfunction of thoracic region: Secondary | ICD-10-CM | POA: Diagnosis not present

## 2021-04-23 DIAGNOSIS — M9901 Segmental and somatic dysfunction of cervical region: Secondary | ICD-10-CM | POA: Diagnosis not present

## 2021-04-23 DIAGNOSIS — M5136 Other intervertebral disc degeneration, lumbar region: Secondary | ICD-10-CM | POA: Diagnosis not present

## 2021-04-23 DIAGNOSIS — M9903 Segmental and somatic dysfunction of lumbar region: Secondary | ICD-10-CM | POA: Diagnosis not present

## 2021-04-23 DIAGNOSIS — M5032 Other cervical disc degeneration, mid-cervical region, unspecified level: Secondary | ICD-10-CM | POA: Diagnosis not present

## 2021-04-25 DIAGNOSIS — M542 Cervicalgia: Secondary | ICD-10-CM | POA: Diagnosis not present

## 2021-04-28 ENCOUNTER — Other Ambulatory Visit: Payer: Self-pay | Admitting: Cardiology

## 2021-05-19 DIAGNOSIS — G4733 Obstructive sleep apnea (adult) (pediatric): Secondary | ICD-10-CM | POA: Diagnosis not present

## 2021-05-25 ENCOUNTER — Ambulatory Visit: Payer: Medicare HMO | Admitting: Cardiology

## 2021-05-28 DIAGNOSIS — E782 Mixed hyperlipidemia: Secondary | ICD-10-CM | POA: Diagnosis not present

## 2021-05-28 DIAGNOSIS — M5032 Other cervical disc degeneration, mid-cervical region, unspecified level: Secondary | ICD-10-CM | POA: Diagnosis not present

## 2021-05-28 DIAGNOSIS — M1990 Unspecified osteoarthritis, unspecified site: Secondary | ICD-10-CM | POA: Diagnosis not present

## 2021-05-28 DIAGNOSIS — I2510 Atherosclerotic heart disease of native coronary artery without angina pectoris: Secondary | ICD-10-CM | POA: Diagnosis not present

## 2021-05-28 DIAGNOSIS — M5136 Other intervertebral disc degeneration, lumbar region: Secondary | ICD-10-CM | POA: Diagnosis not present

## 2021-05-28 DIAGNOSIS — M9901 Segmental and somatic dysfunction of cervical region: Secondary | ICD-10-CM | POA: Diagnosis not present

## 2021-05-28 DIAGNOSIS — K219 Gastro-esophageal reflux disease without esophagitis: Secondary | ICD-10-CM | POA: Diagnosis not present

## 2021-05-28 DIAGNOSIS — N400 Enlarged prostate without lower urinary tract symptoms: Secondary | ICD-10-CM | POA: Diagnosis not present

## 2021-05-28 DIAGNOSIS — M9902 Segmental and somatic dysfunction of thoracic region: Secondary | ICD-10-CM | POA: Diagnosis not present

## 2021-05-28 DIAGNOSIS — M9903 Segmental and somatic dysfunction of lumbar region: Secondary | ICD-10-CM | POA: Diagnosis not present

## 2021-05-28 DIAGNOSIS — I252 Old myocardial infarction: Secondary | ICD-10-CM | POA: Diagnosis not present

## 2021-05-28 DIAGNOSIS — I10 Essential (primary) hypertension: Secondary | ICD-10-CM | POA: Diagnosis not present

## 2021-06-04 DIAGNOSIS — Z1389 Encounter for screening for other disorder: Secondary | ICD-10-CM | POA: Diagnosis not present

## 2021-06-04 DIAGNOSIS — Z136 Encounter for screening for cardiovascular disorders: Secondary | ICD-10-CM | POA: Diagnosis not present

## 2021-06-04 DIAGNOSIS — K219 Gastro-esophageal reflux disease without esophagitis: Secondary | ICD-10-CM | POA: Diagnosis not present

## 2021-06-04 DIAGNOSIS — I10 Essential (primary) hypertension: Secondary | ICD-10-CM | POA: Diagnosis not present

## 2021-06-04 DIAGNOSIS — Z0000 Encounter for general adult medical examination without abnormal findings: Secondary | ICD-10-CM | POA: Diagnosis not present

## 2021-06-04 DIAGNOSIS — Z125 Encounter for screening for malignant neoplasm of prostate: Secondary | ICD-10-CM | POA: Diagnosis not present

## 2021-06-08 DIAGNOSIS — L814 Other melanin hyperpigmentation: Secondary | ICD-10-CM | POA: Diagnosis not present

## 2021-06-08 DIAGNOSIS — L905 Scar conditions and fibrosis of skin: Secondary | ICD-10-CM | POA: Diagnosis not present

## 2021-06-08 DIAGNOSIS — D1801 Hemangioma of skin and subcutaneous tissue: Secondary | ICD-10-CM | POA: Diagnosis not present

## 2021-06-08 DIAGNOSIS — L821 Other seborrheic keratosis: Secondary | ICD-10-CM | POA: Diagnosis not present

## 2021-06-08 DIAGNOSIS — D229 Melanocytic nevi, unspecified: Secondary | ICD-10-CM | POA: Diagnosis not present

## 2021-06-08 DIAGNOSIS — L819 Disorder of pigmentation, unspecified: Secondary | ICD-10-CM | POA: Diagnosis not present

## 2021-06-08 DIAGNOSIS — L570 Actinic keratosis: Secondary | ICD-10-CM | POA: Diagnosis not present

## 2021-06-08 DIAGNOSIS — Z85828 Personal history of other malignant neoplasm of skin: Secondary | ICD-10-CM | POA: Diagnosis not present

## 2021-06-21 ENCOUNTER — Ambulatory Visit: Payer: Medicare HMO | Admitting: Cardiology

## 2021-07-02 DIAGNOSIS — M5136 Other intervertebral disc degeneration, lumbar region: Secondary | ICD-10-CM | POA: Diagnosis not present

## 2021-07-02 DIAGNOSIS — M9903 Segmental and somatic dysfunction of lumbar region: Secondary | ICD-10-CM | POA: Diagnosis not present

## 2021-07-02 DIAGNOSIS — M5032 Other cervical disc degeneration, mid-cervical region, unspecified level: Secondary | ICD-10-CM | POA: Diagnosis not present

## 2021-07-02 DIAGNOSIS — M9902 Segmental and somatic dysfunction of thoracic region: Secondary | ICD-10-CM | POA: Diagnosis not present

## 2021-07-02 DIAGNOSIS — M9901 Segmental and somatic dysfunction of cervical region: Secondary | ICD-10-CM | POA: Diagnosis not present

## 2021-07-09 DIAGNOSIS — G4733 Obstructive sleep apnea (adult) (pediatric): Secondary | ICD-10-CM | POA: Diagnosis not present

## 2021-07-16 ENCOUNTER — Ambulatory Visit: Payer: Medicare HMO | Admitting: Cardiology

## 2021-07-16 ENCOUNTER — Other Ambulatory Visit: Payer: Self-pay

## 2021-07-16 ENCOUNTER — Encounter: Payer: Self-pay | Admitting: Cardiology

## 2021-07-16 VITALS — BP 132/72 | HR 82 | Ht 75.0 in | Wt 230.0 lb

## 2021-07-16 DIAGNOSIS — E782 Mixed hyperlipidemia: Secondary | ICD-10-CM | POA: Diagnosis not present

## 2021-07-16 DIAGNOSIS — I10 Essential (primary) hypertension: Secondary | ICD-10-CM

## 2021-07-16 DIAGNOSIS — I2510 Atherosclerotic heart disease of native coronary artery without angina pectoris: Secondary | ICD-10-CM

## 2021-07-16 DIAGNOSIS — G4733 Obstructive sleep apnea (adult) (pediatric): Secondary | ICD-10-CM

## 2021-07-16 NOTE — Progress Notes (Signed)
Cardiology Office Note:    Date:  07/16/2021   ID:  [name], DOB [birth year], MRN [id number]  PCP:  Kathyrn Lass, MD  Cardiologist:  Jenean Lindau, MD   Referring MD: Leighton Ruff, MD    ASSESSMENT:    1. Coronary artery disease involving native coronary artery of native heart without angina pectoris   2. Mixed hyperlipidemia   3. Essential hypertension   4. Obstructive sleep apnea    PLAN:    In order of problems listed above:  Coronary artery disease: Secondary prevention stressed with the patient.  Importance of compliance with diet medication stressed and he vocalized understanding.  He has been lax with exercise and I told him about this.  He is plans to start a bicycling program again half an hour a day 5 days a week. Essential hypertension: Blood pressure stable and diet was emphasized. Mixed dyslipidemia: Lipids were reviewed triglycerides are elevated and I discussed this with the patient.  Diet was emphasized.  He promises to do better. Patient will be seen in follow-up appointment in 6 months or earlier if the patient has any concerns Echocardiogram will be done to assess murmur heard on auscultation.   Medication Adjustments/Labs and Tests Ordered: Current medicines are reviewed at length with the patient today.  Concerns regarding medicines are outlined above.  No orders of the defined types were placed in this encounter.  No orders of the defined types were placed in this encounter.    No chief complaint on file.    History of Present Illness:    Jacob Parker is a 70 y.o. male.  Patient has past medical history of coronary artery disease post stenting, essential hypertension dyslipidemia and sleep apnea.  He denies any problems at this time and takes care of activities of daily living.  No chest pain orthopnea or PND.  At the time of my evaluation, the patient is alert awake oriented and in no distress.  She has been lax with diet and  exercise.  Past Medical History:  Diagnosis Date   Bilateral hearing loss 03/08/2016   Bilateral impacted cerumen 03/08/2016   Coronary artery disease 2011   has 2 stents in [place]; as if 10/2014 followed by Dr. Wynonia Lawman in [place]    DJD (degenerative joint disease)    Essential hypertension 12/28/2016   GERD (gastroesophageal reflux disease)    History of skin cancer 1981 / 2015   Hyperlipidemia    Hypertension    NSTEMI (non-ST elevated myocardial infarction) ([place]) 12/29/2016   Obstructive sleep apnea 03/08/2016   Primary osteoarthritis of left knee 11/16/2014   Primary osteoarthritis of right knee 01/23/2015   Sleep apnea    uses c pap   Stricture esophagus     Past Surgical History:  Procedure Laterality Date   APPENDECTOMY     CARDIAC CATHETERIZATION N/A 12/30/2016   Procedure: Left Heart Cath and Coronary Angiography;  Surgeon: Lorretta Harp, MD;  Location: [facility];  Service: Cardiovascular;  Laterality: N/A;   CARDIAC CATHETERIZATION N/A 12/30/2016   Procedure: Coronary Stent Intervention;  Surgeon: Lorretta Harp, MD;  Location: [facility];  Service: Cardiovascular;  Laterality: N/A;   COLONOSCOPY     CORONARY ANGIOPLASTY WITH STENT PLACEMENT  2011    x 2 stents (Xience RCA and DIAG stent 2011, [place])   ESOPHAGEAL MANOMETRY N/A 07/12/2020   Procedure: ESOPHAGEAL MANOMETRY (EM);  Surgeon: Wilford Corner, MD;  [facility]: [facility];  Service:  Endoscopy;  Laterality: N/A;   FRACTURE SURGERY  1985   l foot fx / l leg   HERNIA REPAIR  1973   TOTAL KNEE ARTHROPLASTY Left 11/16/2014   Procedure: LEFT TOTAL KNEE ARTHROPLASTY;  Surgeon: Alta Corning, MD;  Location: [facility];  Service: Orthopedics;  Laterality: Left;   TOTAL KNEE ARTHROPLASTY Right 01/23/2015   Procedure: RIGHT TOTAL KNEE ARTHROPLASTY;  Surgeon: Alta Corning, MD;  Location: [facility];  Service: Orthopedics;  Laterality: Right;   UPPER GI ENDOSCOPY     VASECTOMY      Current Medications: Current Meds   Medication Sig   acetaminophen (TYLENOL) 325 MG tablet Take 2 tablets (650 mg total) by mouth every 4 (four) hours as needed for headache or mild pain.   aspirin EC 81 MG tablet Take 81 mg by mouth daily.   esomeprazole (NEXIUM) 20 MG capsule Take 20 mg by mouth daily before breakfast.   lisinopril (ZESTRIL) 20 MG tablet Take 1 tablet (20 mg total) by mouth at bedtime.   Multiple Vitamin (MULTIVITAMIN WITH MINERALS) TABS tablet Take 1 tablet by mouth daily.   nebivolol (BYSTOLIC) 5 MG tablet TAKE 1 TABLET EVERY DAY   nitroGLYCERIN (NITROSTAT) 0.4 MG SL tablet PLACE 1 TABLET UNDER THE TONGUE EVERY 5 MINUTES AS NEEDED FOR CHEST PAIN. DO NOT EXCEED 3 DOSES IN 15 MINUTES   Omega-3 Fatty Acids (FISH OIL) 1000 MG CAPS Take 1,000 mg by mouth daily.   rosuvastatin (CRESTOR) 40 MG tablet Take 1 tablet (40 mg total) by mouth at bedtime.     Allergies:   Patient has no known allergies.   Social History   Socioeconomic History   Marital status: Married    Spouse name: Not on file   Number of children: Not on file   Years of education: Not on file   Highest education level: Not on file  Occupational History   Not on file  Tobacco Use   Smoking status: Never   Smokeless tobacco: Never  Substance and Sexual Activity   Alcohol use: Yes    Alcohol/week: 3.0 standard drinks    Types: 3 Standard drinks or equivalent per week    Comment: occasional   Drug use: No   Sexual activity: Yes  Other Topics Concern   Not on file  Social History Narrative   Not on file   Social Determinants of Health   Financial Resource Strain: Not on file  Food Insecurity: Not on file  Transportation Needs: Not on file  Physical Activity: Not on file  Stress: Not on file  Social Connections: Not on file     Family History: The patient's family history includes Alzheimer's disease in his mother; CAD in his mother.  ROS:   Please see the history of present illness.    All other systems reviewed and are  negative.  EKGs/Labs/Other Studies Reviewed:    The following studies were reviewed today: I discussed my findings with the patient at length.   Recent Labs: 10/02/2020: ALT 40; BUN 14; Creatinine, Ser 1.25; Hemoglobin 16.0; Platelets 227; Potassium 5.1; Sodium 140; TSH 1.340  Recent Lipid Panel    Component Value Date/Time   CHOL 180 10/02/2020 0931   TRIG 260 (H) 10/02/2020 0931   HDL 43 10/02/2020 0931   CHOLHDL 4.2 10/02/2020 0931   CHOLHDL 3.3 12/28/2016 1630   VLDL 30 12/28/2016 1630   LDLCALC 93 10/02/2020 0931    Physical Exam:    VS:  BP  132/72   Pulse 82   Ht '6\' 3"'$  (1.905 m)   Wt 230 lb (104.3 kg)   SpO2 97%   BMI 28.75 kg/m     Wt Readings from Last 3 Encounters:  07/16/21 230 lb (104.3 kg)  10/02/20 233 lb 1.3 oz (105.7 kg)  08/01/20 235 lb (106.6 kg)     GEN: Patient is in no acute distress HEENT: Normal NECK: No JVD; No carotid bruits LYMPHATICS: No lymphadenopathy CARDIAC: Hear sounds regular, 2/6 systolic murmur at the apex. RESPIRATORY:  Clear to auscultation without rales, wheezing or rhonchi  ABDOMEN: Soft, non-tender, non-distended MUSCULOSKELETAL:  No edema; No deformity  SKIN: Warm and dry NEUROLOGIC:  Alert and oriented x 3 PSYCHIATRIC:  Normal affect   Signed, Jenean Lindau, MD  07/16/2021 4:06 PM    [facility]

## 2021-07-16 NOTE — Patient Instructions (Signed)
Medication Instructions:  No medication changes. *If you need a refill on your cardiac medications before your next appointment, please call your pharmacy*   Lab Work: None ordered If you have labs (blood work) drawn today and your tests are completely normal, you will receive your results only by: MyChart Message (if you have MyChart) OR A paper copy in the mail If you have any lab test that is abnormal or we need to change your treatment, we will call you to review the results.   Testing/Procedures: Your physician has requested that you have an echocardiogram. Echocardiography is a painless test that uses sound waves to create images of your heart. It provides your doctor with information about the size and shape of your heart and how well your heart's chambers and valves are working. This procedure takes approximately one hour. There are no restrictions for this procedure.    Follow-Up: At [facility], you and your health needs are our priority.  As part of our continuing mission to provide you with exceptional heart care, we have created designated Provider Care Teams.  These Care Teams include your primary Cardiologist (physician) and Advanced Practice Providers (APPs -  Physician Assistants and Nurse Practitioners) who all work together to provide you with the care you need, when you need it.  We recommend signing up for the patient portal called "MyChart".  Sign up information is provided on this After Visit Summary.  MyChart is used to connect with patients for Virtual Visits (Telemedicine).  Patients are able to view lab/test results, encounter notes, upcoming appointments, etc.  Non-urgent messages can be sent to your provider as well.   To learn more about what you can do with MyChart, go to [email or web address].    Your next appointment:   6 month(s)  The format for your next appointment:   In Person  Provider:   Rajan Revankar, MD   Other  Instructions Echocardiogram An echocardiogram is a test that uses sound waves (ultrasound) to produce images of the heart. Images from an echocardiogram can provide important information about: Heart size and shape. The size and thickness and movement of your heart's walls. Heart muscle function and strength. Heart valve function or if you have stenosis. Stenosis is when the heart valves are too narrow. If blood is flowing backward through the heart valves (regurgitation). A tumor or infectious growth around the heart valves. Areas of heart muscle that are not working well because of poor blood flow or injury from a heart attack. Aneurysm detection. An aneurysm is a weak or damaged part of an artery wall. The wall bulges out from the normal force of blood pumping through the body. Tell a health care provider about: Any allergies you have. All medicines you are taking, including vitamins, herbs, eye drops, creams, and over-the-counter medicines. Any blood disorders you have. Any surgeries you have had. Any medical conditions you have. Whether you are pregnant or may be pregnant. What are the risks? Generally, this is a safe test. However, problems may occur, including an allergic reaction to dye (contrast) that may be used during the test. What happens before the test? No specific preparation is needed. You may eat and drink normally. What happens during the test? You will take off your clothes from the waist up and put on a hospital gown. Electrodes or electrocardiogram (ECG)patches may be placed on your chest. The electrodes or patches are then connected to a device that monitors your heart rate and rhythm. You will   lie down on a table for an ultrasound exam. A gel will be applied to your chest to help sound waves pass through your skin. A handheld device, called a transducer, will be pressed against your chest and moved over your heart. The transducer produces sound waves that travel to  your heart and bounce back (or "echo" back) to the transducer. These sound waves will be captured in real-time and changed into images of your heart that can be viewed on a video monitor. The images will be recorded on a computer and reviewed by your health care provider. You may be asked to change positions or hold your breath for a short time. This makes it easier to get different views or better views of your heart. In some cases, you may receive contrast through an IV in one of your veins. This can improve the quality of the pictures from your heart. The procedure may vary among health care providers and hospitals.   What can I expect after the test? You may return to your normal, everyday life, including diet, activities, and medicines, unless your health care provider tells you not to do that. Follow these instructions at home: It is up to you to get the results of your test. Ask your health care provider, or the department that is doing the test, when your results will be ready. Keep all follow-up visits. This is important. Summary An echocardiogram is a test that uses sound waves (ultrasound) to produce images of the heart. Images from an echocardiogram can provide important information about the size and shape of your heart, heart muscle function, heart valve function, and other possible heart problems. You do not need to do anything to prepare before this test. You may eat and drink normally. After the echocardiogram is completed, you may return to your normal, everyday life, unless your health care provider tells you not to do that. This information is not intended to replace advice given to you by your health care provider. Make sure you discuss any questions you have with your health care provider. Document Revised: 08/01/2020 Document Reviewed: 08/01/2020 Elsevier Patient Education  2021 [employer] Inc[employer]   

## 2021-08-03 ENCOUNTER — Other Ambulatory Visit: Payer: Self-pay

## 2021-08-03 ENCOUNTER — Ambulatory Visit (HOSPITAL_COMMUNITY): Payer: Medicare HMO | Attending: Cardiovascular Disease

## 2021-08-03 DIAGNOSIS — I2510 Atherosclerotic heart disease of native coronary artery without angina pectoris: Secondary | ICD-10-CM | POA: Diagnosis not present

## 2021-08-03 LAB — ECHOCARDIOGRAM COMPLETE
Area-P 1/2: 3.4 cm2
S' Lateral: 2.9 cm

## 2021-08-17 DIAGNOSIS — E782 Mixed hyperlipidemia: Secondary | ICD-10-CM | POA: Diagnosis not present

## 2021-08-17 DIAGNOSIS — I2510 Atherosclerotic heart disease of native coronary artery without angina pectoris: Secondary | ICD-10-CM | POA: Diagnosis not present

## 2021-08-17 DIAGNOSIS — M1990 Unspecified osteoarthritis, unspecified site: Secondary | ICD-10-CM | POA: Diagnosis not present

## 2021-08-17 DIAGNOSIS — K219 Gastro-esophageal reflux disease without esophagitis: Secondary | ICD-10-CM | POA: Diagnosis not present

## 2021-08-17 DIAGNOSIS — I10 Essential (primary) hypertension: Secondary | ICD-10-CM | POA: Diagnosis not present

## 2021-08-17 DIAGNOSIS — I252 Old myocardial infarction: Secondary | ICD-10-CM | POA: Diagnosis not present

## 2021-08-17 DIAGNOSIS — N400 Enlarged prostate without lower urinary tract symptoms: Secondary | ICD-10-CM | POA: Diagnosis not present

## 2021-08-20 DIAGNOSIS — M5136 Other intervertebral disc degeneration, lumbar region: Secondary | ICD-10-CM | POA: Diagnosis not present

## 2021-08-20 DIAGNOSIS — M9901 Segmental and somatic dysfunction of cervical region: Secondary | ICD-10-CM | POA: Diagnosis not present

## 2021-08-20 DIAGNOSIS — M5032 Other cervical disc degeneration, mid-cervical region, unspecified level: Secondary | ICD-10-CM | POA: Diagnosis not present

## 2021-08-20 DIAGNOSIS — G4733 Obstructive sleep apnea (adult) (pediatric): Secondary | ICD-10-CM | POA: Diagnosis not present

## 2021-08-20 DIAGNOSIS — M9902 Segmental and somatic dysfunction of thoracic region: Secondary | ICD-10-CM | POA: Diagnosis not present

## 2021-08-20 DIAGNOSIS — M9903 Segmental and somatic dysfunction of lumbar region: Secondary | ICD-10-CM | POA: Diagnosis not present

## 2021-09-17 DIAGNOSIS — M9901 Segmental and somatic dysfunction of cervical region: Secondary | ICD-10-CM | POA: Diagnosis not present

## 2021-09-17 DIAGNOSIS — M9902 Segmental and somatic dysfunction of thoracic region: Secondary | ICD-10-CM | POA: Diagnosis not present

## 2021-09-17 DIAGNOSIS — M5032 Other cervical disc degeneration, mid-cervical region, unspecified level: Secondary | ICD-10-CM | POA: Diagnosis not present

## 2021-09-17 DIAGNOSIS — M9903 Segmental and somatic dysfunction of lumbar region: Secondary | ICD-10-CM | POA: Diagnosis not present

## 2021-09-17 DIAGNOSIS — M5136 Other intervertebral disc degeneration, lumbar region: Secondary | ICD-10-CM | POA: Diagnosis not present

## 2021-10-09 DIAGNOSIS — G4733 Obstructive sleep apnea (adult) (pediatric): Secondary | ICD-10-CM | POA: Diagnosis not present

## 2021-10-24 DIAGNOSIS — Z20822 Contact with and (suspected) exposure to covid-19: Secondary | ICD-10-CM | POA: Diagnosis not present

## 2021-10-25 DIAGNOSIS — Z20822 Contact with and (suspected) exposure to covid-19: Secondary | ICD-10-CM | POA: Diagnosis not present

## 2021-10-29 DIAGNOSIS — M9902 Segmental and somatic dysfunction of thoracic region: Secondary | ICD-10-CM | POA: Diagnosis not present

## 2021-10-29 DIAGNOSIS — M9901 Segmental and somatic dysfunction of cervical region: Secondary | ICD-10-CM | POA: Diagnosis not present

## 2021-10-29 DIAGNOSIS — M5032 Other cervical disc degeneration, mid-cervical region, unspecified level: Secondary | ICD-10-CM | POA: Diagnosis not present

## 2021-10-29 DIAGNOSIS — M9903 Segmental and somatic dysfunction of lumbar region: Secondary | ICD-10-CM | POA: Diagnosis not present

## 2021-10-29 DIAGNOSIS — M5136 Other intervertebral disc degeneration, lumbar region: Secondary | ICD-10-CM | POA: Diagnosis not present

## 2021-11-01 DIAGNOSIS — L570 Actinic keratosis: Secondary | ICD-10-CM | POA: Diagnosis not present

## 2021-11-29 DIAGNOSIS — L570 Actinic keratosis: Secondary | ICD-10-CM | POA: Diagnosis not present

## 2021-12-06 ENCOUNTER — Other Ambulatory Visit: Payer: Self-pay | Admitting: Cardiology

## 2021-12-12 DIAGNOSIS — M1990 Unspecified osteoarthritis, unspecified site: Secondary | ICD-10-CM | POA: Diagnosis not present

## 2021-12-12 DIAGNOSIS — K219 Gastro-esophageal reflux disease without esophagitis: Secondary | ICD-10-CM | POA: Diagnosis not present

## 2021-12-12 DIAGNOSIS — E782 Mixed hyperlipidemia: Secondary | ICD-10-CM | POA: Diagnosis not present

## 2021-12-12 DIAGNOSIS — I10 Essential (primary) hypertension: Secondary | ICD-10-CM | POA: Diagnosis not present

## 2021-12-12 DIAGNOSIS — N400 Enlarged prostate without lower urinary tract symptoms: Secondary | ICD-10-CM | POA: Diagnosis not present

## 2021-12-12 DIAGNOSIS — I252 Old myocardial infarction: Secondary | ICD-10-CM | POA: Diagnosis not present

## 2021-12-12 DIAGNOSIS — I2510 Atherosclerotic heart disease of native coronary artery without angina pectoris: Secondary | ICD-10-CM | POA: Diagnosis not present

## 2021-12-13 DIAGNOSIS — L820 Inflamed seborrheic keratosis: Secondary | ICD-10-CM | POA: Diagnosis not present

## 2021-12-13 DIAGNOSIS — L538 Other specified erythematous conditions: Secondary | ICD-10-CM | POA: Diagnosis not present

## 2021-12-13 DIAGNOSIS — L570 Actinic keratosis: Secondary | ICD-10-CM | POA: Diagnosis not present

## 2021-12-16 ENCOUNTER — Other Ambulatory Visit: Payer: Self-pay | Admitting: Cardiology

## 2021-12-18 NOTE — Telephone Encounter (Signed)
Rosuvastatin 40 mg # 90 x 2 refill Lisinopril 20 mg # 90 x 2 refill  Sent to   [facility]

## 2021-12-28 ENCOUNTER — Other Ambulatory Visit: Payer: Self-pay

## 2022-01-02 ENCOUNTER — Telehealth: Payer: Self-pay | Admitting: Cardiology

## 2022-01-02 NOTE — Telephone Encounter (Signed)
Patient would like to switch from Dr. Geraldo Pitter to Dr. Harrell Gave, as the [place] location is closer to where he lives. Are you both in agreement with this switch?

## 2022-01-02 NOTE — Telephone Encounter (Signed)
Ok by me

## 2022-01-04 ENCOUNTER — Ambulatory Visit: Payer: Medicare HMO | Admitting: Cardiology

## 2022-01-21 DIAGNOSIS — Z125 Encounter for screening for malignant neoplasm of prostate: Secondary | ICD-10-CM | POA: Diagnosis not present

## 2022-01-21 DIAGNOSIS — R634 Abnormal weight loss: Secondary | ICD-10-CM | POA: Diagnosis not present

## 2022-01-21 DIAGNOSIS — M545 Low back pain, unspecified: Secondary | ICD-10-CM | POA: Diagnosis not present

## 2022-01-21 DIAGNOSIS — N3942 Incontinence without sensory awareness: Secondary | ICD-10-CM | POA: Diagnosis not present

## 2022-01-24 ENCOUNTER — Ambulatory Visit (HOSPITAL_BASED_OUTPATIENT_CLINIC_OR_DEPARTMENT_OTHER): Payer: Medicare HMO | Admitting: Cardiology

## 2022-01-24 ENCOUNTER — Encounter (HOSPITAL_BASED_OUTPATIENT_CLINIC_OR_DEPARTMENT_OTHER): Payer: Self-pay | Admitting: Cardiology

## 2022-01-24 ENCOUNTER — Other Ambulatory Visit: Payer: Self-pay

## 2022-01-24 VITALS — BP 138/70 | HR 72 | Ht 75.0 in | Wt 217.1 lb

## 2022-01-24 DIAGNOSIS — Z7189 Other specified counseling: Secondary | ICD-10-CM

## 2022-01-24 DIAGNOSIS — G4733 Obstructive sleep apnea (adult) (pediatric): Secondary | ICD-10-CM | POA: Diagnosis not present

## 2022-01-24 DIAGNOSIS — I10 Essential (primary) hypertension: Secondary | ICD-10-CM | POA: Diagnosis not present

## 2022-01-24 DIAGNOSIS — E782 Mixed hyperlipidemia: Secondary | ICD-10-CM | POA: Diagnosis not present

## 2022-01-24 DIAGNOSIS — I2510 Atherosclerotic heart disease of native coronary artery without angina pectoris: Secondary | ICD-10-CM

## 2022-01-24 MED ORDER — NITROGLYCERIN 0.4 MG SL SUBL: SUBLINGUAL_TABLET | SUBLINGUAL | 4 refills | Status: DC

## 2022-01-24 NOTE — Progress Notes (Signed)
Cardiology Office Note:    Date:  01/24/2022   ID:  [name], DOB [birth year], MRN [id number]  PCP:  Jacob Lass, MD  Cardiologist:  Jacob Dresser, MD  Referring MD: Jacob Lass, MD   CC: establish care  History of Present Illness:    Jacob Parker is a 71 y.o. male with a hx of CAD, hypertension, mixed hyperlipidemia who is seen to establish care. He was previously followed by Dr. Geraldo Parker, last visit 07/16/21.  CV history: Cad with most recent cath 12/30/2016, PCI to ISR of D1. Original stent placed in 2011.  Today: Feeling well overall. Previously followed by Dr. Wynonia Parker.  Had unintentional weight loss, loss of appetite. Went from 237 lb to 209 lbs. Recently saw his PCP, labs unremarkable. Now appetite is back. No fevers/chills. No melena/hematochezia/hematuria.  Wife is a good cook. Salad, lean protein, vegetables. Restaurant about once/week. Can't walk long distances due to prior ankle injury/surgery. Wants to get back into the pool soon.  Reviewed last lipids from 05/2021 in [place].   Denies chest pain, shortness of breath at rest or with normal exertion. No PND, orthopnea, LE edema or unexpected weight gain. No syncope or palpitations.   Past Medical History:  Diagnosis Date   Bilateral hearing loss 03/08/2016   Bilateral impacted cerumen 03/08/2016   Coronary artery disease 2011   has 2 stents in [place]; as if 10/2014 followed by Dr. Wynonia Parker in [place]    DJD (degenerative joint disease)    Essential hypertension 12/28/2016   GERD (gastroesophageal reflux disease)    History of skin cancer 1981 / 2015   Hyperlipidemia    Hypertension    NSTEMI (non-ST elevated myocardial infarction) ([place]) 12/29/2016   Obstructive sleep apnea 03/08/2016   Primary osteoarthritis of left knee 11/16/2014   Primary osteoarthritis of right knee 01/23/2015   Sleep apnea    uses c pap   Stricture esophagus     Past Surgical History:  Procedure Laterality Date   APPENDECTOMY      CARDIAC CATHETERIZATION N/A 12/30/2016   Procedure: Left Heart Cath and Coronary Angiography;  Surgeon: Jacob Harp, MD;  Location: [facility];  Service: Cardiovascular;  Laterality: N/A;   CARDIAC CATHETERIZATION N/A 12/30/2016   Procedure: Coronary Stent Intervention;  Surgeon: Jacob Harp, MD;  Location: [facility];  Service: Cardiovascular;  Laterality: N/A;   COLONOSCOPY     CORONARY ANGIOPLASTY WITH STENT PLACEMENT  2011    x 2 stents (Xience RCA and DIAG stent 2011, [place])   ESOPHAGEAL MANOMETRY N/A 07/12/2020   Procedure: ESOPHAGEAL MANOMETRY (EM);  Surgeon: Jacob Corner, MD;  [facility];  Service: Endoscopy;  Laterality: N/A;   FRACTURE SURGERY  1985   l foot fx / l leg   HERNIA REPAIR  1973   TOTAL KNEE ARTHROPLASTY Left 11/16/2014   Procedure: LEFT TOTAL KNEE ARTHROPLASTY;  Surgeon: Jacob Corning, MD;  Location: [facility];  Service: Orthopedics;  Laterality: Left;   TOTAL KNEE ARTHROPLASTY Right 01/23/2015   Procedure: RIGHT TOTAL KNEE ARTHROPLASTY;  Surgeon: Jacob Corning, MD;  Location: [facility];  Service: Orthopedics;  Laterality: Right;   UPPER GI ENDOSCOPY     VASECTOMY      Current Medications: Current Outpatient Medications on File Prior to Visit  Medication Sig   acetaminophen (TYLENOL) 325 MG tablet Take 2 tablets (650 mg total) by mouth every 4 (four) hours as needed for headache or mild pain.  aspirin EC 81 MG tablet Take 81 mg by mouth daily.   lisinopril (ZESTRIL) 20 MG tablet Take 20 mg by mouth daily.   Multiple Vitamin (MULTIVITAMIN WITH MINERALS) TABS tablet Take 1 tablet by mouth daily.   nebivolol (BYSTOLIC) 5 MG tablet Take 5 mg by mouth daily.   Omega-3 Fatty Acids (FISH OIL) 1000 MG CAPS Take 1,000 mg by mouth daily.   rosuvastatin (CRESTOR) 40 MG tablet Take 40 mg by mouth at bedtime.   omeprazole (PRILOSEC) 20 MG capsule 1 capsule 30 minutes before morning meal   Zinc 100 MG TABS Take 1 tablet by mouth daily at 12  noon.   No current facility-administered medications on file prior to visit.     Allergies:   Patient has no known allergies.   Social History   Tobacco Use   Smoking status: Never   Smokeless tobacco: Never  Substance Use Topics   Alcohol use: Yes    Alcohol/week: 3.0 standard drinks    Types: 3 Standard drinks or equivalent per week    Comment: occasional   Drug use: No    Family History: family history includes Alzheimer's disease in his mother; CAD in his mother.  ROS:   Please see the history of present illness.  Additional pertinent ROS: Constitutional: Negative for chills, fever, night sweats, unintentional weight loss  HENT: Negative for ear pain and hearing loss.   Eyes: Negative for loss of vision and eye pain.  Respiratory: Negative for cough, sputum, wheezing.   Cardiovascular: See HPI. Gastrointestinal: Negative for abdominal pain, melena, and hematochezia.  Genitourinary: Negative for dysuria and hematuria.  Musculoskeletal: Negative for falls and myalgias.  Skin: Negative for itching and rash.  Neurological: Negative for focal weakness, focal sensory changes and loss of consciousness.  Endo/Heme/Allergies: Does not bruise/bleed easily.     EKGs/Labs/Other Studies Reviewed:    The following studies were reviewed today: Echo 08/03/21 1. Left ventricular ejection fraction, by estimation, is 55 to 60%. Left  ventricular ejection fraction by 3D volume is 56 %. The left ventricle has  normal function. The left ventricle has no regional wall motion  abnormalities. Left ventricular diastolic   parameters are consistent with Grade I diastolic dysfunction (impaired  relaxation). The average left ventricular global longitudinal strain is  -25.5 %. The global longitudinal strain is normal.   2. Right ventricular systolic function is normal. The right ventricular  size is normal. Tricuspid regurgitation signal is inadequate for assessing  PA pressure.   3. The  mitral valve is grossly normal. Trivial mitral valve  regurgitation. No evidence of mitral stenosis.   4. The aortic valve is tricuspid. Aortic valve regurgitation is not  visualized. No aortic stenosis is present.   5. The inferior vena cava is normal in size with greater than 50%  respiratory variability, suggesting right atrial pressure of 3 mmHg.   Comparison(s): Changes from prior study are noted. EF now 55-60%.   Cath 12/30/16 Prox RCA to Dist RCA lesion, 0 %stenosed. Ost RPDA to RPDA lesion, 0 %stenosed. Prox LAD to Mid LAD lesion, 40 %stenosed. 1st Diag lesion, 99 %stenosed. Post intervention, there is a 0% residual stenosis. A stent was successfully placed.   EKG:  EKG is personally reviewed.   01/24/22: NSR, nonspecific ST pattern, 72 bpm  Recent Labs: No results found for requested labs within last 8760 hours.  Recent Lipid Panel    Component Value Date/Time   CHOL 180 10/02/2020 0931   TRIG 260 (  H) 10/02/2020 0931   HDL 43 10/02/2020 0931   CHOLHDL 4.2 10/02/2020 0931   CHOLHDL 3.3 12/28/2016 1630   VLDL 30 12/28/2016 1630   LDLCALC 93 10/02/2020 0931    Physical Exam:    VS:  BP 138/70 (BP Location: Right Arm, Patient Position: Sitting, Cuff Size: Normal)    Pulse 72    Ht 6\' 3"  (1.905 m)    Wt 217 lb 1.6 oz (98.5 kg)    BMI 27.14 kg/m     Wt Readings from Last 3 Encounters:  01/24/22 217 lb 1.6 oz (98.5 kg)  07/16/21 230 lb (104.3 kg)  10/02/20 233 lb 1.3 oz (105.7 kg)    GEN: Well nourished, well developed in no acute distress HEENT: Normal, moist mucous membranes NECK: No JVD CARDIAC: regular rhythm, normal S1 and S2, no rubs or gallops. No murmur. VASCULAR: Radial and DP pulses 2+ bilaterally. No carotid bruits RESPIRATORY:  Clear to auscultation without rales, wheezing or rhonchi  ABDOMEN: Soft, non-tender, non-distended MUSCULOSKELETAL:  Ambulates independently SKIN: Warm and dry, no edema NEUROLOGIC:  Alert and oriented x 3. No focal neuro  deficits noted. PSYCHIATRIC:  Normal affect    ASSESSMENT:    1. Coronary artery disease involving native coronary artery of native heart without angina pectoris   2. Mixed hyperlipidemia   3. Essential hypertension   4. Obstructive sleep apnea   5. Cardiac risk counseling    PLAN:    CAD with prior PCI to D1, RCA Mixed hyperlipidemia -continue rosuvastatin, aspirin -reviewed lipids from [facility] today -plans to increase exercise soon in the pool -good diet -refilled NG today, has not needed   OSA: -on CPAP  Hypertension -continue lisinopril, nebivolol -has home BP cuff, working on learning how to use -goal <130/80, slightly above goal today, he wants to work on exercise   Cardiac risk counseling and prevention recommendations: -recommend heart healthy/Mediterranean diet, with whole grains, fruits, vegetable, fish, lean meats, nuts, and olive oil. Limit salt. -recommend moderate walking, 3-5 times/week for 30-50 minutes each session. Aim for at least 150 minutes.week. Goal should be pace of 3 miles/hours, or walking 1.5 miles in 30 minutes -recommend avoidance of tobacco products. Avoid excess alcohol.  Plan for follow up: 1 year or sooner as needed  Jacob Dresser, MD, PhD, [facility]    Medication Adjustments/Labs and Tests Ordered: Current medicines are reviewed at length with the patient today.  Concerns regarding medicines are outlined above.  Orders Placed This Encounter  Procedures   EKG 12-Lead   Meds ordered this encounter  Medications   nitroGLYCERIN (NITROSTAT) 0.4 MG SL tablet    Sig: PLACE 1 TABLET UNDER THE TONGUE EVERY 5 MINUTES AS NEEDED FOR CHEST PAIN. DO NOT EXCEED 3 DOSES IN 15 MINUTES    Dispense:  25 tablet    Refill:  4    Patient Instructions  Medication Instructions:  Your Physician recommend you continue on your current medication as directed.    *If you need a refill on your cardiac medications before your  next appointment, please call your pharmacy*   Lab Work: None ordered today   Testing/Procedures: None ordered today   Follow-Up: At [facility], you and your health needs are our priority.  As part of our continuing mission to provide you with exceptional heart care, we have created designated Provider Care Teams.  These Care Teams include your primary Cardiologist (physician) and Advanced Practice Providers (APPs -  Physician  Assistants and Nurse Practitioners) who all work together to provide you with the care you need, when you need it.  We recommend signing up for the patient portal called "MyChart".  Sign up information is provided on this After Visit Summary.  MyChart is used to connect with patients for Virtual Visits (Telemedicine).  Patients are able to view lab/test results, encounter notes, upcoming appointments, etc.  Non-urgent messages can be sent to your provider as well.   To learn more about what you can do with MyChart, go to [email or web address].    Your next appointment:   1 year(s)  The format for your next appointment:   In Person  Provider:   Buford Dresser, MD      Signed, Jacob Dresser, MD PhD 01/24/2022 6:17 PM    [place]

## 2022-01-24 NOTE — Patient Instructions (Signed)

## 2022-02-07 DIAGNOSIS — M5441 Lumbago with sciatica, right side: Secondary | ICD-10-CM | POA: Diagnosis not present

## 2022-02-07 DIAGNOSIS — M25552 Pain in left hip: Secondary | ICD-10-CM | POA: Diagnosis not present

## 2022-02-07 DIAGNOSIS — M25551 Pain in right hip: Secondary | ICD-10-CM | POA: Diagnosis not present

## 2022-02-21 DIAGNOSIS — L02821 Furuncle of head [any part, except face]: Secondary | ICD-10-CM | POA: Diagnosis not present

## 2022-02-21 DIAGNOSIS — L570 Actinic keratosis: Secondary | ICD-10-CM | POA: Diagnosis not present

## 2022-02-21 DIAGNOSIS — L821 Other seborrheic keratosis: Secondary | ICD-10-CM | POA: Diagnosis not present

## 2022-02-21 DIAGNOSIS — L814 Other melanin hyperpigmentation: Secondary | ICD-10-CM | POA: Diagnosis not present

## 2022-04-17 DIAGNOSIS — M5431 Sciatica, right side: Secondary | ICD-10-CM | POA: Diagnosis not present

## 2022-04-17 DIAGNOSIS — M9902 Segmental and somatic dysfunction of thoracic region: Secondary | ICD-10-CM | POA: Diagnosis not present

## 2022-04-17 DIAGNOSIS — M5126 Other intervertebral disc displacement, lumbar region: Secondary | ICD-10-CM | POA: Diagnosis not present

## 2022-04-17 DIAGNOSIS — M9905 Segmental and somatic dysfunction of pelvic region: Secondary | ICD-10-CM | POA: Diagnosis not present

## 2022-04-17 DIAGNOSIS — M5032 Other cervical disc degeneration, mid-cervical region, unspecified level: Secondary | ICD-10-CM | POA: Diagnosis not present

## 2022-04-17 DIAGNOSIS — M9903 Segmental and somatic dysfunction of lumbar region: Secondary | ICD-10-CM | POA: Diagnosis not present

## 2022-04-24 DIAGNOSIS — M5126 Other intervertebral disc displacement, lumbar region: Secondary | ICD-10-CM | POA: Diagnosis not present

## 2022-04-24 DIAGNOSIS — M9903 Segmental and somatic dysfunction of lumbar region: Secondary | ICD-10-CM | POA: Diagnosis not present

## 2022-04-24 DIAGNOSIS — M9902 Segmental and somatic dysfunction of thoracic region: Secondary | ICD-10-CM | POA: Diagnosis not present

## 2022-04-24 DIAGNOSIS — M5032 Other cervical disc degeneration, mid-cervical region, unspecified level: Secondary | ICD-10-CM | POA: Diagnosis not present

## 2022-04-24 DIAGNOSIS — M9905 Segmental and somatic dysfunction of pelvic region: Secondary | ICD-10-CM | POA: Diagnosis not present

## 2022-04-24 DIAGNOSIS — M5431 Sciatica, right side: Secondary | ICD-10-CM | POA: Diagnosis not present

## 2022-05-22 DIAGNOSIS — M9903 Segmental and somatic dysfunction of lumbar region: Secondary | ICD-10-CM | POA: Diagnosis not present

## 2022-05-22 DIAGNOSIS — M5126 Other intervertebral disc displacement, lumbar region: Secondary | ICD-10-CM | POA: Diagnosis not present

## 2022-05-22 DIAGNOSIS — M5032 Other cervical disc degeneration, mid-cervical region, unspecified level: Secondary | ICD-10-CM | POA: Diagnosis not present

## 2022-05-22 DIAGNOSIS — M5431 Sciatica, right side: Secondary | ICD-10-CM | POA: Diagnosis not present

## 2022-05-22 DIAGNOSIS — M9905 Segmental and somatic dysfunction of pelvic region: Secondary | ICD-10-CM | POA: Diagnosis not present

## 2022-05-22 DIAGNOSIS — M9902 Segmental and somatic dysfunction of thoracic region: Secondary | ICD-10-CM | POA: Diagnosis not present

## 2022-06-11 DIAGNOSIS — Z0000 Encounter for general adult medical examination without abnormal findings: Secondary | ICD-10-CM | POA: Diagnosis not present

## 2022-06-11 DIAGNOSIS — Z1389 Encounter for screening for other disorder: Secondary | ICD-10-CM | POA: Diagnosis not present

## 2022-06-17 DIAGNOSIS — M5431 Sciatica, right side: Secondary | ICD-10-CM | POA: Diagnosis not present

## 2022-06-17 DIAGNOSIS — M5032 Other cervical disc degeneration, mid-cervical region, unspecified level: Secondary | ICD-10-CM | POA: Diagnosis not present

## 2022-06-17 DIAGNOSIS — M5126 Other intervertebral disc displacement, lumbar region: Secondary | ICD-10-CM | POA: Diagnosis not present

## 2022-06-17 DIAGNOSIS — M9902 Segmental and somatic dysfunction of thoracic region: Secondary | ICD-10-CM | POA: Diagnosis not present

## 2022-06-17 DIAGNOSIS — M9903 Segmental and somatic dysfunction of lumbar region: Secondary | ICD-10-CM | POA: Diagnosis not present

## 2022-06-17 DIAGNOSIS — M9905 Segmental and somatic dysfunction of pelvic region: Secondary | ICD-10-CM | POA: Diagnosis not present

## 2022-07-15 DIAGNOSIS — M5431 Sciatica, right side: Secondary | ICD-10-CM | POA: Diagnosis not present

## 2022-07-15 DIAGNOSIS — M5126 Other intervertebral disc displacement, lumbar region: Secondary | ICD-10-CM | POA: Diagnosis not present

## 2022-07-15 DIAGNOSIS — M5032 Other cervical disc degeneration, mid-cervical region, unspecified level: Secondary | ICD-10-CM | POA: Diagnosis not present

## 2022-07-15 DIAGNOSIS — M9903 Segmental and somatic dysfunction of lumbar region: Secondary | ICD-10-CM | POA: Diagnosis not present

## 2022-07-15 DIAGNOSIS — M9902 Segmental and somatic dysfunction of thoracic region: Secondary | ICD-10-CM | POA: Diagnosis not present

## 2022-07-15 DIAGNOSIS — M9905 Segmental and somatic dysfunction of pelvic region: Secondary | ICD-10-CM | POA: Diagnosis not present

## 2022-08-12 DIAGNOSIS — M9903 Segmental and somatic dysfunction of lumbar region: Secondary | ICD-10-CM | POA: Diagnosis not present

## 2022-08-12 DIAGNOSIS — M9902 Segmental and somatic dysfunction of thoracic region: Secondary | ICD-10-CM | POA: Diagnosis not present

## 2022-08-12 DIAGNOSIS — M5126 Other intervertebral disc displacement, lumbar region: Secondary | ICD-10-CM | POA: Diagnosis not present

## 2022-08-12 DIAGNOSIS — M9905 Segmental and somatic dysfunction of pelvic region: Secondary | ICD-10-CM | POA: Diagnosis not present

## 2022-08-12 DIAGNOSIS — M5032 Other cervical disc degeneration, mid-cervical region, unspecified level: Secondary | ICD-10-CM | POA: Diagnosis not present

## 2022-08-12 DIAGNOSIS — M5431 Sciatica, right side: Secondary | ICD-10-CM | POA: Diagnosis not present

## 2022-08-19 DIAGNOSIS — G4733 Obstructive sleep apnea (adult) (pediatric): Secondary | ICD-10-CM | POA: Diagnosis not present

## 2022-08-29 DIAGNOSIS — L814 Other melanin hyperpigmentation: Secondary | ICD-10-CM | POA: Diagnosis not present

## 2022-08-29 DIAGNOSIS — D485 Neoplasm of uncertain behavior of skin: Secondary | ICD-10-CM | POA: Diagnosis not present

## 2022-08-29 DIAGNOSIS — D225 Melanocytic nevi of trunk: Secondary | ICD-10-CM | POA: Diagnosis not present

## 2022-08-29 DIAGNOSIS — L821 Other seborrheic keratosis: Secondary | ICD-10-CM | POA: Diagnosis not present

## 2022-08-29 DIAGNOSIS — C44229 Squamous cell carcinoma of skin of left ear and external auricular canal: Secondary | ICD-10-CM | POA: Diagnosis not present

## 2022-08-29 DIAGNOSIS — L570 Actinic keratosis: Secondary | ICD-10-CM | POA: Diagnosis not present

## 2022-09-09 DIAGNOSIS — M9903 Segmental and somatic dysfunction of lumbar region: Secondary | ICD-10-CM | POA: Diagnosis not present

## 2022-09-09 DIAGNOSIS — M9902 Segmental and somatic dysfunction of thoracic region: Secondary | ICD-10-CM | POA: Diagnosis not present

## 2022-09-09 DIAGNOSIS — M5126 Other intervertebral disc displacement, lumbar region: Secondary | ICD-10-CM | POA: Diagnosis not present

## 2022-09-09 DIAGNOSIS — M9905 Segmental and somatic dysfunction of pelvic region: Secondary | ICD-10-CM | POA: Diagnosis not present

## 2022-09-09 DIAGNOSIS — M5431 Sciatica, right side: Secondary | ICD-10-CM | POA: Diagnosis not present

## 2022-09-09 DIAGNOSIS — M5032 Other cervical disc degeneration, mid-cervical region, unspecified level: Secondary | ICD-10-CM | POA: Diagnosis not present

## 2022-09-14 ENCOUNTER — Other Ambulatory Visit: Payer: Self-pay | Admitting: Cardiology

## 2022-09-24 ENCOUNTER — Telehealth: Payer: Self-pay | Admitting: Cardiology

## 2022-09-24 MED ORDER — NEBIVOLOL HCL 5 MG PO TABS: ORAL_TABLET | ORAL | 3 refills | Status: DC

## 2022-09-24 NOTE — Telephone Encounter (Signed)
Pt c/o medication issue:  1. Name of Medication: nebivolol (BYSTOLIC) 5 MG tablet  2. How are you currently taking this medication (dosage and times per day)?   3. Are you having a reaction (difficulty breathing--STAT)?   4. What is your medication issue? Pt states that his pharmacy did not fill this medication and is completely out now. Would like to know why this wasn't filled and asks if it could be temporarily sent to: [facility] [place], [address] AT [place]

## 2022-10-14 DIAGNOSIS — M9902 Segmental and somatic dysfunction of thoracic region: Secondary | ICD-10-CM | POA: Diagnosis not present

## 2022-10-14 DIAGNOSIS — M9903 Segmental and somatic dysfunction of lumbar region: Secondary | ICD-10-CM | POA: Diagnosis not present

## 2022-10-14 DIAGNOSIS — M9905 Segmental and somatic dysfunction of pelvic region: Secondary | ICD-10-CM | POA: Diagnosis not present

## 2022-10-14 DIAGNOSIS — M5032 Other cervical disc degeneration, mid-cervical region, unspecified level: Secondary | ICD-10-CM | POA: Diagnosis not present

## 2022-10-14 DIAGNOSIS — M5431 Sciatica, right side: Secondary | ICD-10-CM | POA: Diagnosis not present

## 2022-10-14 DIAGNOSIS — M5126 Other intervertebral disc displacement, lumbar region: Secondary | ICD-10-CM | POA: Diagnosis not present

## 2022-10-31 DIAGNOSIS — C44229 Squamous cell carcinoma of skin of left ear and external auricular canal: Secondary | ICD-10-CM | POA: Diagnosis not present

## 2022-11-11 DIAGNOSIS — M9903 Segmental and somatic dysfunction of lumbar region: Secondary | ICD-10-CM | POA: Diagnosis not present

## 2022-11-11 DIAGNOSIS — M9905 Segmental and somatic dysfunction of pelvic region: Secondary | ICD-10-CM | POA: Diagnosis not present

## 2022-11-11 DIAGNOSIS — M5126 Other intervertebral disc displacement, lumbar region: Secondary | ICD-10-CM | POA: Diagnosis not present

## 2022-11-11 DIAGNOSIS — M5431 Sciatica, right side: Secondary | ICD-10-CM | POA: Diagnosis not present

## 2022-11-11 DIAGNOSIS — M9902 Segmental and somatic dysfunction of thoracic region: Secondary | ICD-10-CM | POA: Diagnosis not present

## 2022-11-11 DIAGNOSIS — M5032 Other cervical disc degeneration, mid-cervical region, unspecified level: Secondary | ICD-10-CM | POA: Diagnosis not present

## 2022-11-22 NOTE — Progress Notes (Unsigned)
   I, Peterson Lombard, LAT, ATC acting as a scribe for Lynne Leader, MD.  Subjective:    CC: Low back pain  HPI: Pt is a 71 y/o male c/o LBP x /. Pt locates pain to   Radiating pain: LE numbness/tingling: LE weakness: Aggravates: Treatments tried:  Pertinent review of Systems: ***  Relevant historical information: ***   Objective:   There were no vitals filed for this visit. General: Well Developed, well nourished, and in no acute distress.   MSK: ***  Lab and Radiology Results No results found for this or any previous visit (from the past 72 hour(s)). No results found.    Impression and Recommendations:    Assessment and Plan: 71 y.o. male with ***.  PDMP not reviewed this encounter. No orders of the defined types were placed in this encounter.  No orders of the defined types were placed in this encounter.   Discussed warning signs or symptoms. Please see discharge instructions. Patient expresses understanding.   ***

## 2022-11-25 ENCOUNTER — Other Ambulatory Visit: Payer: Self-pay | Admitting: Family Medicine

## 2022-11-25 ENCOUNTER — Ambulatory Visit: Payer: Medicare HMO | Admitting: Family Medicine

## 2022-11-25 ENCOUNTER — Ambulatory Visit (INDEPENDENT_AMBULATORY_CARE_PROVIDER_SITE_OTHER): Payer: Medicare HMO

## 2022-11-25 ENCOUNTER — Ambulatory Visit: Payer: Self-pay

## 2022-11-25 VITALS — BP 140/82 | HR 69 | Ht 75.0 in | Wt 211.0 lb

## 2022-11-25 DIAGNOSIS — M545 Low back pain, unspecified: Secondary | ICD-10-CM

## 2022-11-25 DIAGNOSIS — G8929 Other chronic pain: Secondary | ICD-10-CM | POA: Diagnosis not present

## 2022-11-25 DIAGNOSIS — M47816 Spondylosis without myelopathy or radiculopathy, lumbar region: Secondary | ICD-10-CM | POA: Diagnosis not present

## 2022-11-25 DIAGNOSIS — M1811 Unilateral primary osteoarthritis of first carpometacarpal joint, right hand: Secondary | ICD-10-CM | POA: Diagnosis not present

## 2022-11-25 DIAGNOSIS — M79644 Pain in right finger(s): Secondary | ICD-10-CM

## 2022-11-25 NOTE — Patient Instructions (Addendum)
Thank you for coming in today.   Please get an Xray today before you leave   Please use Voltaren gel (Generic Diclofenac Gel) up to 4x daily for pain as needed.  This is available over-the-counter as both the name brand Voltaren gel and the generic diclofenac gel.   Try some hand therapy for thumb arthritis.  Modeling clay is good.  Youtube can be good.   Recheck in 6 weeks.   Let me know if you have a problem.   Add a heel lift to the left shoe. Up to a 1/2 inch.

## 2022-11-26 NOTE — Progress Notes (Signed)
Lumbar spine x-ray shows some arthritis changes.

## 2022-11-26 NOTE — Progress Notes (Signed)
Right hand x-ray shows arthritis most severe at the base of the thumb.

## 2022-12-02 ENCOUNTER — Ambulatory Visit: Payer: Medicare HMO | Admitting: Physical Therapy

## 2022-12-02 ENCOUNTER — Encounter: Payer: Self-pay | Admitting: Physical Therapy

## 2022-12-02 DIAGNOSIS — M5459 Other low back pain: Secondary | ICD-10-CM

## 2022-12-02 DIAGNOSIS — M6281 Muscle weakness (generalized): Secondary | ICD-10-CM

## 2022-12-02 DIAGNOSIS — L905 Scar conditions and fibrosis of skin: Secondary | ICD-10-CM | POA: Diagnosis not present
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
## 2022-12-05 ENCOUNTER — Encounter: Payer: Self-pay | Admitting: Physical Therapy

## 2022-12-05 ENCOUNTER — Ambulatory Visit: Payer: Medicare HMO | Admitting: Physical Therapy

## 2022-12-05 DIAGNOSIS — M5459 Other low back pain: Secondary | ICD-10-CM | POA: Diagnosis not present

## 2022-12-05 DIAGNOSIS — M6281 Muscle weakness (generalized): Secondary | ICD-10-CM | POA: Diagnosis not present
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
## 2022-12-09 ENCOUNTER — Ambulatory Visit: Payer: Medicare HMO | Admitting: Physical Therapy

## 2022-12-09 ENCOUNTER — Encounter: Payer: Self-pay | Admitting: Physical Therapy

## 2022-12-09 DIAGNOSIS — M9902 Segmental and somatic dysfunction of thoracic region: Secondary | ICD-10-CM | POA: Diagnosis not present

## 2022-12-09 DIAGNOSIS — M5459 Other low back pain: Secondary | ICD-10-CM

## 2022-12-09 DIAGNOSIS — M6281 Muscle weakness (generalized): Secondary | ICD-10-CM

## 2022-12-09 DIAGNOSIS — M9903 Segmental and somatic dysfunction of lumbar region: Secondary | ICD-10-CM | POA: Diagnosis not present

## 2022-12-09 DIAGNOSIS — M5032 Other cervical disc degeneration, mid-cervical region, unspecified level: Secondary | ICD-10-CM | POA: Diagnosis not present

## 2022-12-09 DIAGNOSIS — M5431 Sciatica, right side: Secondary | ICD-10-CM | POA: Diagnosis not present

## 2022-12-09 DIAGNOSIS — M9905 Segmental and somatic dysfunction of pelvic region: Secondary | ICD-10-CM | POA: Diagnosis not present

## 2022-12-09 DIAGNOSIS — M5126 Other intervertebral disc displacement, lumbar region: Secondary | ICD-10-CM | POA: Diagnosis not present
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
## 2022-12-13 DIAGNOSIS — I10 Essential (primary) hypertension: Secondary | ICD-10-CM | POA: Diagnosis not present

## 2022-12-13 DIAGNOSIS — S46811A Strain of other muscles, fascia and tendons at shoulder and upper arm level, right arm, initial encounter: Secondary | ICD-10-CM | POA: Diagnosis not present

## 2022-12-14 ENCOUNTER — Other Ambulatory Visit: Payer: Self-pay | Admitting: Cardiology

## 2022-12-17 NOTE — Telephone Encounter (Signed)
Rx(s) sent to pharmacy electronically.  

## 2022-12-19 ENCOUNTER — Encounter: Payer: Medicare HMO | Admitting: Physical Therapy

## 2022-12-19 ENCOUNTER — Other Ambulatory Visit: Payer: Self-pay | Admitting: Cardiology

## 2022-12-26 ENCOUNTER — Encounter: Payer: Medicare HMO | Admitting: Physical Therapy

## 2022-12-30 ENCOUNTER — Encounter: Payer: Self-pay | Admitting: Physical Therapy

## 2022-12-30 ENCOUNTER — Ambulatory Visit: Payer: Medicare HMO | Admitting: Physical Therapy

## 2022-12-30 DIAGNOSIS — M6281 Muscle weakness (generalized): Secondary | ICD-10-CM

## 2022-12-30 DIAGNOSIS — M5459 Other low back pain: Secondary | ICD-10-CM
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
## 2023-01-02 ENCOUNTER — Encounter: Payer: Medicare HMO | Admitting: Physical Therapy

## 2023-01-06 ENCOUNTER — Ambulatory Visit: Payer: Medicare HMO | Admitting: Family Medicine

## 2023-01-06 VITALS — BP 138/76 | HR 69 | Ht 75.0 in | Wt 205.0 lb

## 2023-01-06 DIAGNOSIS — M79644 Pain in right finger(s): Secondary | ICD-10-CM

## 2023-01-06 DIAGNOSIS — G8929 Other chronic pain: Secondary | ICD-10-CM
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
## 2023-01-06 NOTE — Patient Instructions (Signed)
Thank you for coming in today.   Plan for OT for your thumb.   Wrap up PT for your back.   Let me know if this is not working. We can do more.
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
## 2023-01-08 DIAGNOSIS — M5032 Other cervical disc degeneration, mid-cervical region, unspecified level: Secondary | ICD-10-CM | POA: Diagnosis not present

## 2023-01-08 DIAGNOSIS — M5126 Other intervertebral disc displacement, lumbar region: Secondary | ICD-10-CM | POA: Diagnosis not present

## 2023-01-08 DIAGNOSIS — M9902 Segmental and somatic dysfunction of thoracic region: Secondary | ICD-10-CM | POA: Diagnosis not present

## 2023-01-08 DIAGNOSIS — M5431 Sciatica, right side: Secondary | ICD-10-CM | POA: Diagnosis not present

## 2023-01-08 DIAGNOSIS — M9903 Segmental and somatic dysfunction of lumbar region: Secondary | ICD-10-CM | POA: Diagnosis not present

## 2023-01-08 DIAGNOSIS — M9905 Segmental and somatic dysfunction of pelvic region: Secondary | ICD-10-CM | POA: Diagnosis not present

## 2023-01-09 ENCOUNTER — Ambulatory Visit: Payer: Medicare HMO | Admitting: Physical Therapy

## 2023-01-09 ENCOUNTER — Encounter: Payer: Self-pay | Admitting: Physical Therapy

## 2023-01-09 DIAGNOSIS — M5459 Other low back pain: Secondary | ICD-10-CM | POA: Diagnosis not present

## 2023-01-09 DIAGNOSIS — M6281 Muscle weakness (generalized): Secondary | ICD-10-CM | POA: Diagnosis not present
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
## 2023-01-17 ENCOUNTER — Ambulatory Visit: Payer: Medicare HMO | Admitting: Rehabilitative and Restorative Service Providers"

## 2023-01-17 ENCOUNTER — Encounter: Payer: Self-pay | Admitting: Rehabilitative and Restorative Service Providers"

## 2023-01-17 ENCOUNTER — Other Ambulatory Visit: Payer: Self-pay

## 2023-01-17 DIAGNOSIS — M6281 Muscle weakness (generalized): Secondary | ICD-10-CM

## 2023-01-17 DIAGNOSIS — M25641 Stiffness of right hand, not elsewhere classified: Secondary | ICD-10-CM | POA: Diagnosis not present

## 2023-01-17 DIAGNOSIS — R600 Localized edema: Secondary | ICD-10-CM

## 2023-01-17 DIAGNOSIS — M79641 Pain in right hand: Secondary | ICD-10-CM | POA: Diagnosis not present

## 2023-01-20 ENCOUNTER — Encounter: Payer: Self-pay | Admitting: Physical Therapy

## 2023-01-20 ENCOUNTER — Ambulatory Visit: Payer: Medicare HMO | Admitting: Physical Therapy

## 2023-01-20 DIAGNOSIS — M6281 Muscle weakness (generalized): Secondary | ICD-10-CM

## 2023-01-20 DIAGNOSIS — M5459 Other low back pain: Secondary | ICD-10-CM
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
## 2023-01-30 ENCOUNTER — Encounter: Payer: Medicare HMO | Admitting: Rehabilitative and Restorative Service Providers"

## 2023-02-03 DIAGNOSIS — M9903 Segmental and somatic dysfunction of lumbar region: Secondary | ICD-10-CM | POA: Diagnosis not present

## 2023-02-03 DIAGNOSIS — M5032 Other cervical disc degeneration, mid-cervical region, unspecified level: Secondary | ICD-10-CM | POA: Diagnosis not present

## 2023-02-03 DIAGNOSIS — M5431 Sciatica, right side: Secondary | ICD-10-CM | POA: Diagnosis not present

## 2023-02-03 DIAGNOSIS — M9902 Segmental and somatic dysfunction of thoracic region: Secondary | ICD-10-CM | POA: Diagnosis not present

## 2023-02-03 DIAGNOSIS — M9905 Segmental and somatic dysfunction of pelvic region: Secondary | ICD-10-CM | POA: Diagnosis not present

## 2023-02-03 DIAGNOSIS — M5126 Other intervertebral disc displacement, lumbar region: Secondary | ICD-10-CM | POA: Diagnosis not present
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
## 2023-02-06 ENCOUNTER — Ambulatory Visit: Payer: Medicare HMO | Admitting: Rehabilitative and Restorative Service Providers"

## 2023-02-06 ENCOUNTER — Encounter: Payer: Self-pay | Admitting: Rehabilitative and Restorative Service Providers"

## 2023-02-06 DIAGNOSIS — M6281 Muscle weakness (generalized): Secondary | ICD-10-CM

## 2023-02-06 DIAGNOSIS — M79641 Pain in right hand: Secondary | ICD-10-CM | POA: Diagnosis not present

## 2023-02-06 DIAGNOSIS — M25641 Stiffness of right hand, not elsewhere classified: Secondary | ICD-10-CM

## 2023-02-06 DIAGNOSIS — R600 Localized edema: Secondary | ICD-10-CM
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
## 2023-02-13 ENCOUNTER — Ambulatory Visit: Payer: Medicare HMO | Admitting: Rehabilitative and Restorative Service Providers"

## 2023-02-13 DIAGNOSIS — R600 Localized edema: Secondary | ICD-10-CM

## 2023-02-13 DIAGNOSIS — M6281 Muscle weakness (generalized): Secondary | ICD-10-CM

## 2023-02-13 DIAGNOSIS — M79641 Pain in right hand: Secondary | ICD-10-CM | POA: Diagnosis not present

## 2023-02-13 DIAGNOSIS — M25641 Stiffness of right hand, not elsewhere classified: Secondary | ICD-10-CM

## 2023-02-20 ENCOUNTER — Encounter: Payer: Medicare HMO | Admitting: Rehabilitative and Restorative Service Providers"

## 2023-02-28 ENCOUNTER — Encounter: Payer: Medicare HMO | Admitting: Rehabilitative and Restorative Service Providers"

## 2023-03-03 DIAGNOSIS — M5126 Other intervertebral disc displacement, lumbar region: Secondary | ICD-10-CM | POA: Diagnosis not present

## 2023-03-03 DIAGNOSIS — M9905 Segmental and somatic dysfunction of pelvic region: Secondary | ICD-10-CM | POA: Diagnosis not present

## 2023-03-03 DIAGNOSIS — M9903 Segmental and somatic dysfunction of lumbar region: Secondary | ICD-10-CM | POA: Diagnosis not present

## 2023-03-03 DIAGNOSIS — M9902 Segmental and somatic dysfunction of thoracic region: Secondary | ICD-10-CM | POA: Diagnosis not present

## 2023-03-03 DIAGNOSIS — M5032 Other cervical disc degeneration, mid-cervical region, unspecified level: Secondary | ICD-10-CM | POA: Diagnosis not present

## 2023-03-03 DIAGNOSIS — M5431 Sciatica, right side: Secondary | ICD-10-CM | POA: Diagnosis not present

## 2023-03-13 DIAGNOSIS — H52223 Regular astigmatism, bilateral: Secondary | ICD-10-CM | POA: Diagnosis not present

## 2023-04-07 DIAGNOSIS — M5032 Other cervical disc degeneration, mid-cervical region, unspecified level: Secondary | ICD-10-CM | POA: Diagnosis not present

## 2023-04-07 DIAGNOSIS — M5126 Other intervertebral disc displacement, lumbar region: Secondary | ICD-10-CM | POA: Diagnosis not present

## 2023-04-07 DIAGNOSIS — M9903 Segmental and somatic dysfunction of lumbar region: Secondary | ICD-10-CM | POA: Diagnosis not present

## 2023-04-07 DIAGNOSIS — M5431 Sciatica, right side: Secondary | ICD-10-CM | POA: Diagnosis not present

## 2023-04-07 DIAGNOSIS — M9905 Segmental and somatic dysfunction of pelvic region: Secondary | ICD-10-CM | POA: Diagnosis not present

## 2023-04-07 DIAGNOSIS — M9902 Segmental and somatic dysfunction of thoracic region: Secondary | ICD-10-CM | POA: Diagnosis not present

## 2023-04-09 DIAGNOSIS — M5431 Sciatica, right side: Secondary | ICD-10-CM | POA: Diagnosis not present

## 2023-04-09 DIAGNOSIS — M5126 Other intervertebral disc displacement, lumbar region: Secondary | ICD-10-CM | POA: Diagnosis not present

## 2023-04-09 DIAGNOSIS — M9902 Segmental and somatic dysfunction of thoracic region: Secondary | ICD-10-CM | POA: Diagnosis not present

## 2023-04-09 DIAGNOSIS — M9903 Segmental and somatic dysfunction of lumbar region: Secondary | ICD-10-CM | POA: Diagnosis not present

## 2023-04-09 DIAGNOSIS — M9905 Segmental and somatic dysfunction of pelvic region: Secondary | ICD-10-CM | POA: Diagnosis not present

## 2023-04-09 DIAGNOSIS — M5032 Other cervical disc degeneration, mid-cervical region, unspecified level: Secondary | ICD-10-CM | POA: Diagnosis not present

## 2023-05-07 DIAGNOSIS — D225 Melanocytic nevi of trunk: Secondary | ICD-10-CM | POA: Diagnosis not present

## 2023-05-07 DIAGNOSIS — M5431 Sciatica, right side: Secondary | ICD-10-CM | POA: Diagnosis not present

## 2023-05-07 DIAGNOSIS — M9902 Segmental and somatic dysfunction of thoracic region: Secondary | ICD-10-CM | POA: Diagnosis not present

## 2023-05-07 DIAGNOSIS — Z85828 Personal history of other malignant neoplasm of skin: Secondary | ICD-10-CM | POA: Diagnosis not present

## 2023-05-07 DIAGNOSIS — M5032 Other cervical disc degeneration, mid-cervical region, unspecified level: Secondary | ICD-10-CM | POA: Diagnosis not present

## 2023-05-07 DIAGNOSIS — L570 Actinic keratosis: Secondary | ICD-10-CM | POA: Diagnosis not present

## 2023-05-07 DIAGNOSIS — Z08 Encounter for follow-up examination after completed treatment for malignant neoplasm: Secondary | ICD-10-CM | POA: Diagnosis not present

## 2023-05-07 DIAGNOSIS — M5126 Other intervertebral disc displacement, lumbar region: Secondary | ICD-10-CM | POA: Diagnosis not present

## 2023-05-07 DIAGNOSIS — L821 Other seborrheic keratosis: Secondary | ICD-10-CM | POA: Diagnosis not present

## 2023-05-07 DIAGNOSIS — M9903 Segmental and somatic dysfunction of lumbar region: Secondary | ICD-10-CM | POA: Diagnosis not present

## 2023-05-07 DIAGNOSIS — M9905 Segmental and somatic dysfunction of pelvic region: Secondary | ICD-10-CM | POA: Diagnosis not present

## 2023-05-07 DIAGNOSIS — L814 Other melanin hyperpigmentation: Secondary | ICD-10-CM | POA: Diagnosis not present

## 2023-05-21 DIAGNOSIS — H52223 Regular astigmatism, bilateral: Secondary | ICD-10-CM | POA: Diagnosis not present

## 2023-05-21 DIAGNOSIS — D3131 Benign neoplasm of right choroid: Secondary | ICD-10-CM | POA: Diagnosis not present

## 2023-05-21 DIAGNOSIS — H04222 Epiphora due to insufficient drainage, left lacrimal gland: Secondary | ICD-10-CM | POA: Diagnosis not present

## 2023-05-21 DIAGNOSIS — H2513 Age-related nuclear cataract, bilateral: Secondary | ICD-10-CM | POA: Diagnosis not present

## 2023-05-21 DIAGNOSIS — H5202 Hypermetropia, left eye: Secondary | ICD-10-CM | POA: Diagnosis not present

## 2023-05-21 DIAGNOSIS — H5211 Myopia, right eye: Secondary | ICD-10-CM | POA: Diagnosis not present

## 2023-05-21 DIAGNOSIS — H524 Presbyopia: Secondary | ICD-10-CM | POA: Diagnosis not present

## 2023-05-28 DIAGNOSIS — M9905 Segmental and somatic dysfunction of pelvic region: Secondary | ICD-10-CM | POA: Diagnosis not present

## 2023-05-28 DIAGNOSIS — M9902 Segmental and somatic dysfunction of thoracic region: Secondary | ICD-10-CM | POA: Diagnosis not present

## 2023-05-28 DIAGNOSIS — M5126 Other intervertebral disc displacement, lumbar region: Secondary | ICD-10-CM | POA: Diagnosis not present

## 2023-05-28 DIAGNOSIS — M5032 Other cervical disc degeneration, mid-cervical region, unspecified level: Secondary | ICD-10-CM | POA: Diagnosis not present

## 2023-05-28 DIAGNOSIS — M9903 Segmental and somatic dysfunction of lumbar region: Secondary | ICD-10-CM | POA: Diagnosis not present

## 2023-05-28 DIAGNOSIS — M5431 Sciatica, right side: Secondary | ICD-10-CM | POA: Diagnosis not present

## 2023-06-02 DIAGNOSIS — M545 Low back pain, unspecified: Secondary | ICD-10-CM | POA: Diagnosis not present

## 2023-06-02 DIAGNOSIS — M4696 Unspecified inflammatory spondylopathy, lumbar region: Secondary | ICD-10-CM | POA: Diagnosis not present

## 2023-06-10 DIAGNOSIS — M47816 Spondylosis without myelopathy or radiculopathy, lumbar region: Secondary | ICD-10-CM | POA: Diagnosis not present

## 2023-06-13 DIAGNOSIS — H04222 Epiphora due to insufficient drainage, left lacrimal gland: Secondary | ICD-10-CM | POA: Diagnosis not present

## 2023-06-13 DIAGNOSIS — D3131 Benign neoplasm of right choroid: Secondary | ICD-10-CM | POA: Diagnosis not present

## 2023-06-16 DIAGNOSIS — Z6827 Body mass index (BMI) 27.0-27.9, adult: Secondary | ICD-10-CM | POA: Diagnosis not present

## 2023-06-16 DIAGNOSIS — Z0000 Encounter for general adult medical examination without abnormal findings: Secondary | ICD-10-CM | POA: Diagnosis not present

## 2023-06-25 DIAGNOSIS — M47816 Spondylosis without myelopathy or radiculopathy, lumbar region: Secondary | ICD-10-CM | POA: Diagnosis not present

## 2023-06-30 DIAGNOSIS — M5431 Sciatica, right side: Secondary | ICD-10-CM | POA: Diagnosis not present

## 2023-06-30 DIAGNOSIS — M9902 Segmental and somatic dysfunction of thoracic region: Secondary | ICD-10-CM | POA: Diagnosis not present

## 2023-06-30 DIAGNOSIS — M5032 Other cervical disc degeneration, mid-cervical region, unspecified level: Secondary | ICD-10-CM | POA: Diagnosis not present

## 2023-06-30 DIAGNOSIS — M9905 Segmental and somatic dysfunction of pelvic region: Secondary | ICD-10-CM | POA: Diagnosis not present

## 2023-06-30 DIAGNOSIS — M5126 Other intervertebral disc displacement, lumbar region: Secondary | ICD-10-CM | POA: Diagnosis not present

## 2023-06-30 DIAGNOSIS — M9903 Segmental and somatic dysfunction of lumbar region: Secondary | ICD-10-CM | POA: Diagnosis not present

## 2023-07-25 DIAGNOSIS — M47816 Spondylosis without myelopathy or radiculopathy, lumbar region: Secondary | ICD-10-CM | POA: Diagnosis not present

## 2023-08-06 DIAGNOSIS — M47816 Spondylosis without myelopathy or radiculopathy, lumbar region: Secondary | ICD-10-CM | POA: Diagnosis not present

## 2023-08-20 DIAGNOSIS — M9905 Segmental and somatic dysfunction of pelvic region: Secondary | ICD-10-CM | POA: Diagnosis not present

## 2023-08-20 DIAGNOSIS — M5032 Other cervical disc degeneration, mid-cervical region, unspecified level: Secondary | ICD-10-CM | POA: Diagnosis not present

## 2023-08-20 DIAGNOSIS — M9903 Segmental and somatic dysfunction of lumbar region: Secondary | ICD-10-CM | POA: Diagnosis not present

## 2023-08-20 DIAGNOSIS — M5431 Sciatica, right side: Secondary | ICD-10-CM | POA: Diagnosis not present

## 2023-08-20 DIAGNOSIS — M9902 Segmental and somatic dysfunction of thoracic region: Secondary | ICD-10-CM | POA: Diagnosis not present

## 2023-08-20 DIAGNOSIS — M5126 Other intervertebral disc displacement, lumbar region: Secondary | ICD-10-CM | POA: Diagnosis not present

## 2023-09-03 DIAGNOSIS — M47816 Spondylosis without myelopathy or radiculopathy, lumbar region: Secondary | ICD-10-CM | POA: Diagnosis not present

## 2023-09-12 ENCOUNTER — Other Ambulatory Visit: Payer: Self-pay | Admitting: *Deleted

## 2023-09-12 DIAGNOSIS — I2510 Atherosclerotic heart disease of native coronary artery without angina pectoris: Secondary | ICD-10-CM

## 2023-09-12 MED ORDER — NITROGLYCERIN 0.4 MG SL SUBL: SUBLINGUAL_TABLET | SUBLINGUAL | 0 refills | Status: DC

## 2023-09-17 DIAGNOSIS — M9905 Segmental and somatic dysfunction of pelvic region: Secondary | ICD-10-CM | POA: Diagnosis not present

## 2023-09-17 DIAGNOSIS — M9903 Segmental and somatic dysfunction of lumbar region: Secondary | ICD-10-CM | POA: Diagnosis not present

## 2023-09-17 DIAGNOSIS — M9902 Segmental and somatic dysfunction of thoracic region: Secondary | ICD-10-CM | POA: Diagnosis not present

## 2023-09-17 DIAGNOSIS — M5126 Other intervertebral disc displacement, lumbar region: Secondary | ICD-10-CM | POA: Diagnosis not present

## 2023-09-17 DIAGNOSIS — M5032 Other cervical disc degeneration, mid-cervical region, unspecified level: Secondary | ICD-10-CM | POA: Diagnosis not present

## 2023-09-17 DIAGNOSIS — M5431 Sciatica, right side: Secondary | ICD-10-CM | POA: Diagnosis not present

## 2023-09-19 DIAGNOSIS — M47816 Spondylosis without myelopathy or radiculopathy, lumbar region: Secondary | ICD-10-CM | POA: Diagnosis not present

## 2023-10-15 DIAGNOSIS — M5431 Sciatica, right side: Secondary | ICD-10-CM | POA: Diagnosis not present

## 2023-10-15 DIAGNOSIS — M9902 Segmental and somatic dysfunction of thoracic region: Secondary | ICD-10-CM | POA: Diagnosis not present

## 2023-10-15 DIAGNOSIS — M9905 Segmental and somatic dysfunction of pelvic region: Secondary | ICD-10-CM | POA: Diagnosis not present

## 2023-10-15 DIAGNOSIS — M5126 Other intervertebral disc displacement, lumbar region: Secondary | ICD-10-CM | POA: Diagnosis not present

## 2023-10-15 DIAGNOSIS — M5032 Other cervical disc degeneration, mid-cervical region, unspecified level: Secondary | ICD-10-CM | POA: Diagnosis not present

## 2023-10-15 DIAGNOSIS — M9903 Segmental and somatic dysfunction of lumbar region: Secondary | ICD-10-CM | POA: Diagnosis not present

## 2023-10-24 DIAGNOSIS — I5189 Other ill-defined heart diseases: Secondary | ICD-10-CM | POA: Diagnosis not present

## 2023-10-24 DIAGNOSIS — I10 Essential (primary) hypertension: Secondary | ICD-10-CM | POA: Diagnosis not present

## 2023-10-24 DIAGNOSIS — E782 Mixed hyperlipidemia: Secondary | ICD-10-CM | POA: Diagnosis not present

## 2023-10-24 DIAGNOSIS — Z6827 Body mass index (BMI) 27.0-27.9, adult: Secondary | ICD-10-CM | POA: Diagnosis not present

## 2023-10-24 DIAGNOSIS — G3184 Mild cognitive impairment, so stated: Secondary | ICD-10-CM | POA: Diagnosis not present

## 2023-10-27 ENCOUNTER — Other Ambulatory Visit: Payer: Self-pay | Admitting: Family Medicine

## 2023-10-27 DIAGNOSIS — G3184 Mild cognitive impairment, so stated: Secondary | ICD-10-CM

## 2023-10-29 DIAGNOSIS — G4733 Obstructive sleep apnea (adult) (pediatric): Secondary | ICD-10-CM | POA: Diagnosis not present

## 2023-10-31 DIAGNOSIS — H04222 Epiphora due to insufficient drainage, left lacrimal gland: Secondary | ICD-10-CM | POA: Diagnosis not present

## 2023-10-31 DIAGNOSIS — D3131 Benign neoplasm of right choroid: Secondary | ICD-10-CM | POA: Diagnosis not present

## 2023-11-05 DIAGNOSIS — M47816 Spondylosis without myelopathy or radiculopathy, lumbar region: Secondary | ICD-10-CM | POA: Diagnosis not present

## 2023-11-12 DIAGNOSIS — M5032 Other cervical disc degeneration, mid-cervical region, unspecified level: Secondary | ICD-10-CM | POA: Diagnosis not present

## 2023-11-12 DIAGNOSIS — M9903 Segmental and somatic dysfunction of lumbar region: Secondary | ICD-10-CM | POA: Diagnosis not present

## 2023-11-12 DIAGNOSIS — M5431 Sciatica, right side: Secondary | ICD-10-CM | POA: Diagnosis not present

## 2023-11-12 DIAGNOSIS — M5126 Other intervertebral disc displacement, lumbar region: Secondary | ICD-10-CM | POA: Diagnosis not present

## 2023-11-12 DIAGNOSIS — M9905 Segmental and somatic dysfunction of pelvic region: Secondary | ICD-10-CM | POA: Diagnosis not present

## 2023-11-12 DIAGNOSIS — M9902 Segmental and somatic dysfunction of thoracic region: Secondary | ICD-10-CM | POA: Diagnosis not present

## 2023-11-19 ENCOUNTER — Ambulatory Visit
Admission: RE | Admit: 2023-11-19 | Discharge: 2023-11-19 | Disposition: A | Discharge status: Home / Self Care | Payer: Medicare HMO | Source: Ambulatory Visit | Attending: Family Medicine | Admitting: Family Medicine

## 2023-11-19 DIAGNOSIS — G319 Degenerative disease of nervous system, unspecified: Secondary | ICD-10-CM | POA: Diagnosis not present

## 2023-11-19 DIAGNOSIS — I6782 Cerebral ischemia: Secondary | ICD-10-CM | POA: Diagnosis not present

## 2023-11-19 DIAGNOSIS — G3184 Mild cognitive impairment, so stated: Secondary | ICD-10-CM

## 2023-11-26 DIAGNOSIS — D044 Carcinoma in situ of skin of scalp and neck: Secondary | ICD-10-CM | POA: Diagnosis not present

## 2023-11-26 DIAGNOSIS — D0439 Carcinoma in situ of skin of other parts of face: Secondary | ICD-10-CM | POA: Diagnosis not present

## 2023-11-26 DIAGNOSIS — L570 Actinic keratosis: Secondary | ICD-10-CM | POA: Diagnosis not present

## 2023-12-10 DIAGNOSIS — M9902 Segmental and somatic dysfunction of thoracic region: Secondary | ICD-10-CM | POA: Diagnosis not present

## 2023-12-10 DIAGNOSIS — M9903 Segmental and somatic dysfunction of lumbar region: Secondary | ICD-10-CM | POA: Diagnosis not present

## 2023-12-10 DIAGNOSIS — M5032 Other cervical disc degeneration, mid-cervical region, unspecified level: Secondary | ICD-10-CM | POA: Diagnosis not present

## 2023-12-10 DIAGNOSIS — Z955 Presence of coronary angioplasty implant and graft: Secondary | ICD-10-CM | POA: Diagnosis not present

## 2023-12-10 DIAGNOSIS — N401 Enlarged prostate with lower urinary tract symptoms: Secondary | ICD-10-CM | POA: Diagnosis not present

## 2023-12-10 DIAGNOSIS — Z6827 Body mass index (BMI) 27.0-27.9, adult: Secondary | ICD-10-CM | POA: Diagnosis not present

## 2023-12-10 DIAGNOSIS — M5431 Sciatica, right side: Secondary | ICD-10-CM | POA: Diagnosis not present

## 2023-12-10 DIAGNOSIS — K219 Gastro-esophageal reflux disease without esophagitis: Secondary | ICD-10-CM | POA: Diagnosis not present

## 2023-12-10 DIAGNOSIS — I25119 Atherosclerotic heart disease of native coronary artery with unspecified angina pectoris: Secondary | ICD-10-CM | POA: Diagnosis not present

## 2023-12-10 DIAGNOSIS — N400 Enlarged prostate without lower urinary tract symptoms: Secondary | ICD-10-CM | POA: Diagnosis not present

## 2023-12-10 DIAGNOSIS — I252 Old myocardial infarction: Secondary | ICD-10-CM | POA: Diagnosis not present

## 2023-12-10 DIAGNOSIS — M5126 Other intervertebral disc displacement, lumbar region: Secondary | ICD-10-CM | POA: Diagnosis not present

## 2023-12-10 DIAGNOSIS — M9905 Segmental and somatic dysfunction of pelvic region: Secondary | ICD-10-CM | POA: Diagnosis not present

## 2023-12-10 DIAGNOSIS — R351 Nocturia: Secondary | ICD-10-CM | POA: Diagnosis not present

## 2023-12-26 DIAGNOSIS — M47816 Spondylosis without myelopathy or radiculopathy, lumbar region: Secondary | ICD-10-CM | POA: Diagnosis not present

## 2024-01-07 DIAGNOSIS — M9903 Segmental and somatic dysfunction of lumbar region: Secondary | ICD-10-CM | POA: Diagnosis not present

## 2024-01-07 DIAGNOSIS — M9902 Segmental and somatic dysfunction of thoracic region: Secondary | ICD-10-CM | POA: Diagnosis not present

## 2024-01-07 DIAGNOSIS — M9905 Segmental and somatic dysfunction of pelvic region: Secondary | ICD-10-CM | POA: Diagnosis not present

## 2024-01-07 DIAGNOSIS — M5126 Other intervertebral disc displacement, lumbar region: Secondary | ICD-10-CM | POA: Diagnosis not present

## 2024-01-07 DIAGNOSIS — M5032 Other cervical disc degeneration, mid-cervical region, unspecified level: Secondary | ICD-10-CM | POA: Diagnosis not present

## 2024-01-07 DIAGNOSIS — M5431 Sciatica, right side: Secondary | ICD-10-CM | POA: Diagnosis not present

## 2024-01-28 DIAGNOSIS — D492 Neoplasm of unspecified behavior of bone, soft tissue, and skin: Secondary | ICD-10-CM | POA: Diagnosis not present

## 2024-01-28 DIAGNOSIS — L538 Other specified erythematous conditions: Secondary | ICD-10-CM | POA: Diagnosis not present

## 2024-01-28 DIAGNOSIS — L281 Prurigo nodularis: Secondary | ICD-10-CM | POA: Diagnosis not present

## 2024-01-28 DIAGNOSIS — L821 Other seborrheic keratosis: Secondary | ICD-10-CM | POA: Diagnosis not present

## 2024-01-28 DIAGNOSIS — L570 Actinic keratosis: Secondary | ICD-10-CM | POA: Diagnosis not present

## 2024-01-29 DIAGNOSIS — H04522 Eversion of left lacrimal punctum: Secondary | ICD-10-CM | POA: Diagnosis not present

## 2024-01-29 DIAGNOSIS — H04223 Epiphora due to insufficient drainage, bilateral lacrimal glands: Secondary | ICD-10-CM | POA: Diagnosis not present

## 2024-01-29 DIAGNOSIS — H01009 Unspecified blepharitis unspecified eye, unspecified eyelid: Secondary | ICD-10-CM | POA: Diagnosis not present

## 2024-01-29 DIAGNOSIS — H04552 Acquired stenosis of left nasolacrimal duct: Secondary | ICD-10-CM | POA: Diagnosis not present

## 2024-01-29 DIAGNOSIS — H02889 Meibomian gland dysfunction of unspecified eye, unspecified eyelid: Secondary | ICD-10-CM | POA: Diagnosis not present

## 2024-02-11 ENCOUNTER — Ambulatory Visit: Payer: Medicare HMO | Admitting: Diagnostic Neuroimaging

## 2024-02-23 ENCOUNTER — Telehealth (HOSPITAL_BASED_OUTPATIENT_CLINIC_OR_DEPARTMENT_OTHER): Payer: Self-pay | Admitting: Cardiology

## 2024-02-23 NOTE — Telephone Encounter (Signed)
   Name: ELDOR CONAWAY  DOB: March 07, 1951  MRN: [id number]  Primary Cardiologist: Jodelle Red, MD  Chart reviewed as part of pre-operative protocol coverage. Because of DAIMIAN SUDBERRY past medical history and time since last visit, he will require a follow-up in-office visit in order to better assess preoperative cardiovascular risk. Pt has not been seen since 2023.  Pre-op covering staff: - Please schedule appointment and call patient to inform them. If patient already had an upcoming appointment within acceptable timeframe, please add "pre-op clearance" to the appointment notes so provider is aware. - Please contact requesting surgeon's office via preferred method (i.e, phone, fax) to inform them of need for appointment prior to surgery.  This message will also be routed to  Dr Cristal Deer given history of multiple stents for input on holding Aspirin as requested below so that this information is available to the clearing provider at time of patient's appointment.   Joylene Grapes, NP  02/23/2024, 4:09 PM

## 2024-02-23 NOTE — Telephone Encounter (Signed)
   Pre-operative Risk Assessment    Patient Name: Jacob Parker  DOB: 29-Mar-1951 MRN: [id number]   Date of last office visit: 01/24/22  Date of next office visit:     Not scheduled   Request for Surgical Clearance    Procedure:   DCR   Date of Surgery:  Clearance 03/03/24                                Surgeon:  Dr. Dairl Ponder, [facility] Group or Practice Name:  [facility] Phone number:  [phone]  Fax number:  [phone]   Type of Clearance Requested:   - Medical  - Pharmacy:  Hold Aspirin     Type of Anesthesia:  General    Additional requests/questions:   Caller Gearldine Bienenstock) stated patient will need to hold his aspirin 7-10 days prior to surgery.   Signed, Annetta Maw   02/23/2024, 4:02 PM

## 2024-02-24 NOTE — Telephone Encounter (Signed)
 Dr Darcel Bayley is asking for hold time of ASA 7-10 days; Caller Gearldine Bienenstock) stated patient will need to hold his aspirin 7-10 days prior to surgery.

## 2024-02-24 NOTE — Telephone Encounter (Signed)
 Pt has been scheduled to see Dr. Jodelle Red 02/26/24 @ 2:20 for preop clearance. Ok per Dr. Cristal Deer to use time slot for 02/26/24 @ 2:20. Pt thanked me for the help and the call today.   I will update all parties involved.

## 2024-02-26 ENCOUNTER — Encounter (HOSPITAL_BASED_OUTPATIENT_CLINIC_OR_DEPARTMENT_OTHER): Payer: Self-pay | Admitting: Cardiology

## 2024-02-26 ENCOUNTER — Ambulatory Visit (HOSPITAL_BASED_OUTPATIENT_CLINIC_OR_DEPARTMENT_OTHER): Admitting: Cardiology

## 2024-02-26 VITALS — BP 132/64 | HR 82 | Ht 75.0 in | Wt 208.5 lb

## 2024-02-26 DIAGNOSIS — Z01810 Encounter for preprocedural cardiovascular examination: Secondary | ICD-10-CM | POA: Diagnosis not present

## 2024-02-26 DIAGNOSIS — G4733 Obstructive sleep apnea (adult) (pediatric): Secondary | ICD-10-CM | POA: Diagnosis not present

## 2024-02-26 DIAGNOSIS — E7800 Pure hypercholesterolemia, unspecified: Secondary | ICD-10-CM

## 2024-02-26 DIAGNOSIS — I2510 Atherosclerotic heart disease of native coronary artery without angina pectoris: Secondary | ICD-10-CM

## 2024-02-26 DIAGNOSIS — I10 Essential (primary) hypertension: Secondary | ICD-10-CM | POA: Diagnosis not present

## 2024-02-26 MED ORDER — NITROGLYCERIN 0.4 MG SL SUBL: SUBLINGUAL_TABLET | SUBLINGUAL | 2 refills | Status: AC

## 2024-02-26 NOTE — Patient Instructions (Signed)
 Medication Instructions:  Your physician recommends that you continue on your current medications as directed. Please refer to the Current Medication list given to you today.  *If you need a refill on your cardiac medications before your next appointment, please call your pharmacy*  Lab Work: Your physician recommends that you return for lab work: LIPIDS and LPa  Follow-Up: At [facility], you and your health needs are our priority.  As part of our continuing mission to provide you with exceptional heart care, we have created designated Provider Care Teams.  These Care Teams include your primary Cardiologist (physician) and Advanced Practice Providers (APPs -  Physician Assistants and Nurse Practitioners) who all work together to provide you with the care you need, when you need it.  We recommend signing up for the patient portal called "MyChart".  Sign up information is provided on this After Visit Summary.  MyChart is used to connect with patients for Virtual Visits (Telemedicine).  Patients are able to view lab/test results, encounter notes, upcoming appointments, etc.  Non-urgent messages can be sent to your provider as well.   To learn more about what you can do with MyChart, go to [email or web address].    Your next appointment:   1 year  Provider:   Jodelle Red, MD

## 2024-02-26 NOTE — Progress Notes (Signed)
 Cardiology Office Note:  .   Date:  02/26/2024  ID:  Jacob Parker, DOB Jun 22, 1951, MRN [id number] PCP: Sigmund Hazel, MD  [facility] Providers Cardiologist:  Jodelle Red, MD {  History of Present Illness: .   Jacob Parker is a 73 y.o. male with a hx of CAD, hypertension, mixed hyperlipidemia who is seen for follow up today. He was previously followed by Dr. Tomie China, and he established care with me on 01/24/2022.   CV history: Cad with most recent cath 12/30/2016, PCI to ISR of D1. Original stent placed in 2011.  Today: He is here today for preop cardiovascular evaluation. He is planned for Dacryocystorhinostomy with Dr. Darcel Bayley on 03/03/24. They have asked for clearance to hold aspirin for 7-10 days prior to surgery; he stopped aspirin yesterday to make it for 7 days. He is not sure what type of anesthesia he will receive.    We did discuss that he has a long stretch of stent in the RCA, and he has stent in stent on the left side. We reviewed that these are increased risk of thrombosis, and he should restart aspirin as soon as surgically reasonable to minimize risk of thrombosis of these stents.   ROS: Denies chest pain, shortness of breath at rest or with normal exertion. No PND, orthopnea, LE edema or unexpected weight gain. No syncope or palpitations. ROS otherwise negative except as noted.   Studies Reviewed: Marland Kitchen    EKG:  EKG Interpretation Date/Time:  Thursday February 26 2024 14:28:48 EST Ventricular Rate:  84 PR Interval:  170 QRS Duration:  82 QT Interval:  368 QTC Calculation: 434 R Axis:   99  Text Interpretation: Normal sinus rhythm Rightward axis Confirmed by Jodelle Red [phone]) on 02/26/2024 3:00:07 PM    Physical Exam:   VS:  BP 132/64   Pulse 82   Ht 6\' 3"  (1.905 m)   Wt 208 lb 8 oz (94.6 kg)   SpO2 93%   BMI 26.06 kg/m    Wt Readings from Last 3 Encounters:  02/26/24 208 lb 8 oz (94.6 kg)  01/06/23 205 lb (93 kg)  11/25/22 211  lb (95.7 kg)    GEN: Well nourished, well developed in no acute distress HEENT: Normal, moist mucous membranes NECK: No JVD CARDIAC: regular rhythm, normal S1 and S2, no rubs or gallops. No murmur. VASCULAR: Radial and DP pulses 2+ bilaterally. No carotid bruits RESPIRATORY:  Clear to auscultation without rales, wheezing or rhonchi  ABDOMEN: Soft, non-tender, non-distended MUSCULOSKELETAL:  Ambulates independently SKIN: Warm and dry, no edema NEUROLOGIC:  Alert and oriented x 3. No focal neuro deficits noted. PSYCHIATRIC:  Normal affect    ASSESSMENT AND PLAN: .    Preoperative cardiovascular evaluation According to the Revised Cardiac Risk Index (RCRI), his Perioperative Risk of Major Cardiac Event is (%): 0.9  His Functional Capacity in METs is: 9.25 according to the Duke Activity Status Index (DASI).  The patient is not currently having active cardiac symptoms, and they can achieve >4 METs of activity.  According to ACC/AHA Guidelines, no further testing is needed.  Proceed with surgery at acceptable risk.  Our service is available as needed in the peri-operative period.     He is already holding aspirin for 7 days. We discussed that he has two high risk features for stent thrombosis, including long line of stents in the RCA and stent in stent on the left side. He should remain off aspirin for as short  of a period of time as is surgically necessary.  CAD with prior PCI to D1, RCA Mixed hyperlipidemia -continue rosuvastatin, aspirin -lipids and lpa ordered today -refilled NG today, has not needed    OSA: -on CPAP   Hypertension -continue lisinopril, nebivolol -has home BP cuff, working on learning how to use -goal <130/80, slightly above goal today, he wants to work on exercise   CV risk counseling and prevention -recommend heart healthy/Mediterranean diet, with whole grains, fruits, vegetable, fish, lean meats, nuts, and olive oil. Limit salt. -recommend moderate  walking, 3-5 times/week for 30-50 minutes each session. Aim for at least 150 minutes.week. Goal should be pace of 3 miles/hours, or walking 1.5 miles in 30 minutes -recommend avoidance of tobacco products. Avoid excess alcohol.  Dispo: 1 year or sooner as needed  Signed, Jodelle Red, MD   Jodelle Red, MD, PhD, [facility] [place]  [facility]  [place]  [facility] [address] [place], [place] [address] [phone]

## 2024-02-27 ENCOUNTER — Telehealth (HOSPITAL_BASED_OUTPATIENT_CLINIC_OR_DEPARTMENT_OTHER): Payer: Self-pay | Admitting: Cardiology

## 2024-02-27 NOTE — Telephone Encounter (Signed)
 I have reviewed Dr. Hughie Closs Christopher's ov notes for preop clearance.   Notes from MD reflect the pt has been cleared with recommendation for holding ASA.  I will fax notes to the surgeon's office.

## 2024-02-27 NOTE — Telephone Encounter (Signed)
 Caller Gearldine Bienenstock) is following up on the status of patient's clearance and wants to get a copy of patient's visit notes faxed to them at fax# [phone].

## 2024-03-03 DIAGNOSIS — H04552 Acquired stenosis of left nasolacrimal duct: Secondary | ICD-10-CM | POA: Diagnosis not present

## 2024-03-03 DIAGNOSIS — H04222 Epiphora due to insufficient drainage, left lacrimal gland: Secondary | ICD-10-CM | POA: Diagnosis not present

## 2024-03-17 DIAGNOSIS — M9902 Segmental and somatic dysfunction of thoracic region: Secondary | ICD-10-CM | POA: Diagnosis not present

## 2024-03-17 DIAGNOSIS — M9903 Segmental and somatic dysfunction of lumbar region: Secondary | ICD-10-CM | POA: Diagnosis not present

## 2024-03-17 DIAGNOSIS — M5022 Other cervical disc displacement, mid-cervical region, unspecified level: Secondary | ICD-10-CM | POA: Diagnosis not present

## 2024-03-17 DIAGNOSIS — M5126 Other intervertebral disc displacement, lumbar region: Secondary | ICD-10-CM | POA: Diagnosis not present

## 2024-03-17 DIAGNOSIS — M5124 Other intervertebral disc displacement, thoracic region: Secondary | ICD-10-CM | POA: Diagnosis not present

## 2024-03-17 DIAGNOSIS — M9901 Segmental and somatic dysfunction of cervical region: Secondary | ICD-10-CM | POA: Diagnosis not present

## 2024-03-19 DIAGNOSIS — E7800 Pure hypercholesterolemia, unspecified: Secondary | ICD-10-CM | POA: Diagnosis not present

## 2024-03-20 LAB — LIPID PANEL
Chol/HDL Ratio: 2.9 ratio (ref 0.0–5.0)
Cholesterol, Total: 134 mg/dL (ref 100–199)
HDL: 46 mg/dL (ref >39)
LDL Chol Calc (NIH): 59 mg/dL (ref 0–99)
Triglycerides: 174 mg/dL — ABNORMAL HIGH (ref 0–149)
VLDL Cholesterol Cal: 29 mg/dL (ref 5–40)

## 2024-03-20 LAB — LIPOPROTEIN A (LPA): Lipoprotein (a): 57.2 nmol/L (ref <75.0)

## 2024-03-22 ENCOUNTER — Encounter (HOSPITAL_BASED_OUTPATIENT_CLINIC_OR_DEPARTMENT_OTHER): Payer: Self-pay

## 2024-03-24 ENCOUNTER — Ambulatory Visit: Payer: Medicare HMO | Admitting: Diagnostic Neuroimaging

## 2024-03-24 ENCOUNTER — Encounter: Payer: Self-pay | Admitting: Diagnostic Neuroimaging

## 2024-03-24 VITALS — BP 145/80 | HR 79 | Ht 74.0 in | Wt 210.0 lb

## 2024-03-24 DIAGNOSIS — Z818 Family history of other mental and behavioral disorders: Secondary | ICD-10-CM

## 2024-03-24 DIAGNOSIS — Z0000 Encounter for general adult medical examination without abnormal findings: Secondary | ICD-10-CM

## 2024-03-24 NOTE — Patient Instructions (Signed)
  NORMAL AGING / MEMORY EVALUATION - try to stay active physically and get some exercise (at least 15-30+ minutes per day) - continue BP control, lipid control, CPAP for OSA - eat a nutritious diet with lean protein, plants / vegetables, whole grains; avoid ultra-processed foods - increase social activities, brain stimulation, games, puzzles, hobbies, crafts, arts, music; try new activities; keep it fun! - aim for at least 7-8 hours sleep per night (or more) - avoid smoking and alcohol

## 2024-03-24 NOTE — Progress Notes (Signed)
 [facility]  PATIENT: Jacob Parker DOB: Jan 14, 1951  REFERRING CLINICIAN: Orpha Bur, MD HISTORY FROM: patient  REASON FOR VISIT: new consult   HISTORICAL  CHIEF COMPLAINT:  Chief Complaint  Patient presents with   Neurologic Problem    Rm 6 alone Pt is well, reports he has a host of dementia in his family and is here to discuss the chances of him progressing. He denies any memory concerns at this time.      HISTORY OF PRESENT ILLNESS:   73 year old male here for evaluation of memory evaluation.  Patient denies any memory problems.  And PCP referral notes apparently patient's wife was concerned about some mild memory problems for the patient.  Today patient denies any of these issues.  However patient does have strong family history of dementia and both of his parents and both of his maternal grandparents.  Therefore he wanted to be proactive and get a neurologic evaluation to see what could be done to prevent onset of dementia.  Patient is a retired Education officer, environmental.  He retired at the beginning of the pandemic.  He was living in [place] and then moved to [place].  Initially he was somewhat bored because he was not working.  He tried to find activities to keep busy but sometimes felt restless.  Otherwise he is fairly healthy eating a good diet and trying to stay active physically.  He tries to stay mentally active with games and puzzles.   REVIEW OF SYSTEMS: Full 14 system review of systems performed and negative with exception of: as per HPI.  ALLERGIES: Allergies  Allergen Reactions   Amitriptyline Hcl Other (See Comments)    HOME MEDICATIONS: Outpatient Medications Prior to Visit  Medication Sig Dispense Refill   acetaminophen (TYLENOL) 325 MG tablet Take 2 tablets (650 mg total) by mouth every 4 (four) hours as needed for headache or mild pain. 30 tablet 0   aspirin EC 81 MG tablet Take 81 mg by mouth daily.     lisinopril (ZESTRIL) 20 MG  tablet TAKE 1 TABLET AT BEDTIME 90 tablet 3   Multiple Vitamin (MULTIVITAMIN WITH MINERALS) TABS tablet Take 1 tablet by mouth daily.     nebivolol (BYSTOLIC) 5 MG tablet TAKE 1 TABLET EVERY DAY (NEED MD APPOINTMENT) 90 tablet 3   nitroGLYCERIN (NITROSTAT) 0.4 MG SL tablet PLACE 1 TABLET UNDER THE TONGUE EVERY 5 MINUTES AS NEEDED FOR CHEST PAIN. DO NOT EXCEED 3 DOSES IN 15 MINUTES 100 tablet 2   Omega-3 Fatty Acids (FISH OIL) 1000 MG CAPS Take 1,000 mg by mouth daily.     omeprazole (PRILOSEC) 20 MG capsule 1 capsule 30 minutes before morning meal     rosuvastatin (CRESTOR) 40 MG tablet TAKE 1 TABLET AT BEDTIME 90 tablet 3   tamsulosin (FLOMAX) 0.4 MG CAPS capsule Take 1 capsule by mouth daily.     Zinc 100 MG TABS Take 1 tablet by mouth daily at 12 noon.     No facility-administered medications prior to visit.    PAST MEDICAL HISTORY: Past Medical History:  Diagnosis Date   Bilateral hearing loss 03/08/2016   Bilateral impacted cerumen 03/08/2016   Coronary artery disease 2011   has 2 stents in [place]; as if 10/2014 followed by Dr. Donnie Aho in [place]    DJD (degenerative joint disease)    Essential hypertension 12/28/2016   GERD (gastroesophageal reflux disease)    History of skin cancer 1981 / 2015   Hyperlipidemia  Hypertension    NSTEMI (non-ST elevated myocardial infarction) (HCC) 12/29/2016   Obstructive sleep apnea 03/08/2016   Primary osteoarthritis of left knee 11/16/2014   Primary osteoarthritis of right knee 01/23/2015   Sleep apnea    uses c pap   Stricture esophagus     PAST SURGICAL HISTORY: Past Surgical History:  Procedure Laterality Date   APPENDECTOMY     CARDIAC CATHETERIZATION N/A 12/30/2016   Procedure: Left Heart Cath and Coronary Angiography;  Surgeon: Runell Gess, MD;  Location: [facility] INVASIVE CV LAB;  Service: Cardiovascular;  Laterality: N/A;   CARDIAC CATHETERIZATION N/A 12/30/2016   Procedure: Coronary Stent Intervention;  Surgeon: Runell Gess, MD;   Location: [facility] INVASIVE CV LAB;  Service: Cardiovascular;  Laterality: N/A;   COLONOSCOPY     CORONARY ANGIOPLASTY WITH STENT PLACEMENT  2011    x 2 stents (Xience RCA and DIAG stent 2011, [place])   ESOPHAGEAL MANOMETRY N/A 07/12/2020   Procedure: ESOPHAGEAL MANOMETRY (EM);  Surgeon: Charlott Rakes, MD;  Location: [facility] ENDOSCOPY;  Service: Endoscopy;  Laterality: N/A;   FRACTURE SURGERY  1985   l foot fx / l leg   HERNIA REPAIR  1973   TOTAL KNEE ARTHROPLASTY Left 11/16/2014   Procedure: LEFT TOTAL KNEE ARTHROPLASTY;  Surgeon: Harvie Junior, MD;  Location: [facility] ORS;  Service: Orthopedics;  Laterality: Left;   TOTAL KNEE ARTHROPLASTY Right 01/23/2015   Procedure: RIGHT TOTAL KNEE ARTHROPLASTY;  Surgeon: Harvie Junior, MD;  Location: [facility] OR;  Service: Orthopedics;  Laterality: Right;   UPPER GI ENDOSCOPY     VASECTOMY      FAMILY HISTORY: Family History  Problem Relation Age of Onset   CAD Mother    Alzheimer's disease Mother    Dementia Mother    Dementia Father    Dementia Maternal Grandmother    Dementia Maternal Grandfather    Stroke Paternal Grandfather     SOCIAL HISTORY: Social History   Socioeconomic History   Marital status: Married    Spouse name: Not on file   Number of children: Not on file   Years of education: Not on file   Highest education level: Not on file  Occupational History   Not on file  Tobacco Use   Smoking status: Never   Smokeless tobacco: Never  Substance and Sexual Activity   Alcohol use: Yes    Alcohol/week: 3.0 standard drinks of alcohol    Types: 3 Standard drinks or equivalent per week    Comment: occasional   Drug use: No   Sexual activity: Yes  Other Topics Concern   Not on file  Social History Narrative   Not on file   Social Drivers of Health   Financial Resource Strain: Not on file  Food Insecurity: Not on file  Transportation Needs: Not on file  Physical Activity: Not on file  Stress: Not on file  Social Connections: Not on  file  Intimate Partner Violence: Not on file     PHYSICAL EXAM  GENERAL EXAM/CONSTITUTIONAL: Vitals:  Vitals:   03/24/24 0940  BP: (!) 145/80  Pulse: 79  Weight: 210 lb (95.3 kg)  Height: 6\' 2"  (1.88 m)   Body mass index is 26.96 kg/m. Wt Readings from Last 3 Encounters:  03/24/24 210 lb (95.3 kg)  02/26/24 208 lb 8 oz (94.6 kg)  01/06/23 205 lb (93 kg)   Patient is in no distress; well developed, nourished and groomed; neck is supple  CARDIOVASCULAR: Examination of carotid  arteries is normal; no carotid bruits Regular rate and rhythm, no murmurs Examination of peripheral vascular system by observation and palpation is normal  EYES: Ophthalmoscopic exam of optic discs and posterior segments is normal; no papilledema or hemorrhages No results found.  MUSCULOSKELETAL: Gait, strength, tone, movements noted in Neurologic exam below  NEUROLOGIC: MENTAL STATUS:      No data to display            03/24/2024    9:43 AM  Montreal Cognitive Assessment   Visuospatial/ Executive (0/5) 3  Naming (0/3) 3  Attention: Read list of digits (0/2) 2  Attention: Read list of letters (0/1) 1  Attention: Serial 7 subtraction starting at 100 (0/3) 3  Language: Repeat phrase (0/2) 2  Language : Fluency (0/1) 1  Abstraction (0/2) 2  Delayed Recall (0/5) 5  Orientation (0/6) 6  Total 28     awake, alert, oriented to person, place and time recent and remote memory intact normal attention and concentration language fluent, comprehension intact, naming intact fund of knowledge appropriate  CRANIAL NERVE:  2nd - no papilledema on fundoscopic exam 2nd, 3rd, 4th, 6th - pupils equal and reactive to light, visual fields full to confrontation, extraocular muscles intact, no nystagmus 5th - facial sensation symmetric 7th - facial strength symmetric 8th - hearing intact 9th - palate elevates symmetrically, uvula midline 11th - shoulder shrug symmetric 12th - tongue protrusion  midline  MOTOR:  normal bulk and tone, full strength in the BUE, BLE  SENSORY:  normal and symmetric to light touch, temperature, vibration  COORDINATION:  finger-nose-finger, fine finger movements normal  REFLEXES:  deep tendon reflexes present and symmetric  GAIT/STATION:  narrow based gait     DIAGNOSTIC DATA (LABS, IMAGING, TESTING) - I reviewed patient records, labs, notes, testing and imaging myself where available.  Lab Results  Component Value Date   WBC 9.0 10/02/2020   HGB 16.0 10/02/2020   HCT 47.6 10/02/2020   MCV 90 10/02/2020   PLT 227 10/02/2020      Component Value Date/Time   NA 140 10/02/2020 0931   K 5.1 10/02/2020 0931   CL 100 10/02/2020 0931   CO2 26 10/02/2020 0931   GLUCOSE 87 10/02/2020 0931   GLUCOSE 116 (H) 07/20/2020 0524   BUN 14 10/02/2020 0931   CREATININE 1.25 10/02/2020 0931   CREATININE 1.40 (H) 07/17/2013 1851   CALCIUM 10.0 10/02/2020 0931   PROT 7.3 10/02/2020 0931   ALBUMIN 4.6 10/02/2020 0931   AST 29 10/02/2020 0931   ALT 40 10/02/2020 0931   ALKPHOS 136 (H) 10/02/2020 0931   BILITOT 0.6 10/02/2020 0931   GFRNONAA 58 (L) 10/02/2020 0931   GFRAA 67 10/02/2020 0931   Lab Results  Component Value Date   CHOL 134 03/19/2024   HDL 46 03/19/2024   LDLCALC 59 03/19/2024   TRIG 174 (H) 03/19/2024   CHOLHDL 2.9 03/19/2024   Lab Results  Component Value Date   HGBA1C 5.2 12/28/2016   No results found for: "VITAMINB12" Lab Results  Component Value Date   TSH 1.340 10/02/2020    11/19/23 MRI brain [I reviewed images myself and agree with interpretation. -VRP]  1.  No evidence of an acute intracranial abnormality. 2. Mild-to-moderate chronic small vessel ischemic changes within the cerebral white matter. 3. Mild generalized cerebral atrophy. 4. Incompletely assessed cervical spondylosis. At C3-C4, a posterior disc osteophyte complex contributes to apparent moderate spinal canal stenosis (with some spinal cord  flattening). A  cervical spine MRI may be obtained for further evaluation, as clinically warranted.   ASSESSMENT AND PLAN  73 y.o. year old male here with:   Dx:  1. Family history of dementia   2. Wellness examination     PLAN:  NORMAL AGING / MEMORY EVALUATION - patient is cognitively normal with intact activities of daily living and normal neurologic examination - try to stay active physically and get some exercise (at least 15-30+ minutes per day) - continue BP control, lipid control, CPAP for OSA - eat a nutritious diet with lean protein, plants / vegetables, whole grains; avoid ultra-processed foods - increase social activities, brain stimulation, games, puzzles, hobbies, crafts, arts, music; try new activities; keep it fun! - aim for at least 7-8 hours sleep per night (or more) - avoid smoking and alcohol  Return for return to PCP, pending if symptoms worsen or fail to improve.    Suanne Marker, MD 03/24/2024, 10:34 AM Certified in Neurology, Neurophysiology and Neuroimaging  [facility] [address] [place], [place] [address] [phone]

## 2024-03-27 ENCOUNTER — Other Ambulatory Visit (HOSPITAL_BASED_OUTPATIENT_CLINIC_OR_DEPARTMENT_OTHER): Payer: Self-pay | Admitting: Cardiology

## 2024-04-14 DIAGNOSIS — M9901 Segmental and somatic dysfunction of cervical region: Secondary | ICD-10-CM | POA: Diagnosis not present

## 2024-04-14 DIAGNOSIS — M5022 Other cervical disc displacement, mid-cervical region, unspecified level: Secondary | ICD-10-CM | POA: Diagnosis not present

## 2024-04-14 DIAGNOSIS — M5126 Other intervertebral disc displacement, lumbar region: Secondary | ICD-10-CM | POA: Diagnosis not present

## 2024-04-14 DIAGNOSIS — M9902 Segmental and somatic dysfunction of thoracic region: Secondary | ICD-10-CM | POA: Diagnosis not present

## 2024-04-14 DIAGNOSIS — M9903 Segmental and somatic dysfunction of lumbar region: Secondary | ICD-10-CM | POA: Diagnosis not present

## 2024-04-14 DIAGNOSIS — M5124 Other intervertebral disc displacement, thoracic region: Secondary | ICD-10-CM | POA: Diagnosis not present

## 2024-05-12 DIAGNOSIS — M5124 Other intervertebral disc displacement, thoracic region: Secondary | ICD-10-CM | POA: Diagnosis not present

## 2024-05-12 DIAGNOSIS — M9902 Segmental and somatic dysfunction of thoracic region: Secondary | ICD-10-CM | POA: Diagnosis not present

## 2024-05-12 DIAGNOSIS — M5022 Other cervical disc displacement, mid-cervical region, unspecified level: Secondary | ICD-10-CM | POA: Diagnosis not present

## 2024-05-12 DIAGNOSIS — M9901 Segmental and somatic dysfunction of cervical region: Secondary | ICD-10-CM | POA: Diagnosis not present

## 2024-05-12 DIAGNOSIS — M5126 Other intervertebral disc displacement, lumbar region: Secondary | ICD-10-CM | POA: Diagnosis not present

## 2024-05-12 DIAGNOSIS — M9903 Segmental and somatic dysfunction of lumbar region: Secondary | ICD-10-CM | POA: Diagnosis not present

## 2024-06-04 DIAGNOSIS — H04522 Eversion of left lacrimal punctum: Secondary | ICD-10-CM | POA: Diagnosis not present

## 2024-06-04 DIAGNOSIS — H04552 Acquired stenosis of left nasolacrimal duct: Secondary | ICD-10-CM | POA: Diagnosis not present

## 2024-06-04 DIAGNOSIS — H02889 Meibomian gland dysfunction of unspecified eye, unspecified eyelid: Secondary | ICD-10-CM | POA: Diagnosis not present

## 2024-06-04 DIAGNOSIS — H01009 Unspecified blepharitis unspecified eye, unspecified eyelid: Secondary | ICD-10-CM | POA: Diagnosis not present

## 2024-06-04 DIAGNOSIS — H04223 Epiphora due to insufficient drainage, bilateral lacrimal glands: Secondary | ICD-10-CM | POA: Diagnosis not present

## 2024-06-09 DIAGNOSIS — M9901 Segmental and somatic dysfunction of cervical region: Secondary | ICD-10-CM | POA: Diagnosis not present

## 2024-06-09 DIAGNOSIS — M5124 Other intervertebral disc displacement, thoracic region: Secondary | ICD-10-CM | POA: Diagnosis not present

## 2024-06-09 DIAGNOSIS — M5126 Other intervertebral disc displacement, lumbar region: Secondary | ICD-10-CM | POA: Diagnosis not present

## 2024-06-09 DIAGNOSIS — M9903 Segmental and somatic dysfunction of lumbar region: Secondary | ICD-10-CM | POA: Diagnosis not present

## 2024-06-09 DIAGNOSIS — M5022 Other cervical disc displacement, mid-cervical region, unspecified level: Secondary | ICD-10-CM | POA: Diagnosis not present

## 2024-06-09 DIAGNOSIS — M9902 Segmental and somatic dysfunction of thoracic region: Secondary | ICD-10-CM | POA: Diagnosis not present

## 2024-07-14 DIAGNOSIS — M5414 Radiculopathy, thoracic region: Secondary | ICD-10-CM | POA: Diagnosis not present

## 2024-07-14 DIAGNOSIS — M531 Cervicobrachial syndrome: Secondary | ICD-10-CM | POA: Diagnosis not present

## 2024-07-14 DIAGNOSIS — M9901 Segmental and somatic dysfunction of cervical region: Secondary | ICD-10-CM | POA: Diagnosis not present

## 2024-07-14 DIAGNOSIS — M5416 Radiculopathy, lumbar region: Secondary | ICD-10-CM | POA: Diagnosis not present

## 2024-07-14 DIAGNOSIS — M9903 Segmental and somatic dysfunction of lumbar region: Secondary | ICD-10-CM | POA: Diagnosis not present

## 2024-07-14 DIAGNOSIS — M9902 Segmental and somatic dysfunction of thoracic region: Secondary | ICD-10-CM | POA: Diagnosis not present

## 2024-07-21 DIAGNOSIS — K219 Gastro-esophageal reflux disease without esophagitis: Secondary | ICD-10-CM | POA: Diagnosis not present

## 2024-07-21 DIAGNOSIS — I10 Essential (primary) hypertension: Secondary | ICD-10-CM | POA: Diagnosis not present

## 2024-07-21 DIAGNOSIS — Z85828 Personal history of other malignant neoplasm of skin: Secondary | ICD-10-CM | POA: Diagnosis not present

## 2024-07-21 DIAGNOSIS — G4733 Obstructive sleep apnea (adult) (pediatric): Secondary | ICD-10-CM | POA: Diagnosis not present

## 2024-07-21 DIAGNOSIS — D225 Melanocytic nevi of trunk: Secondary | ICD-10-CM | POA: Diagnosis not present

## 2024-07-21 DIAGNOSIS — L570 Actinic keratosis: Secondary | ICD-10-CM | POA: Diagnosis not present

## 2024-07-21 DIAGNOSIS — Z08 Encounter for follow-up examination after completed treatment for malignant neoplasm: Secondary | ICD-10-CM | POA: Diagnosis not present

## 2024-07-21 DIAGNOSIS — Z1331 Encounter for screening for depression: Secondary | ICD-10-CM | POA: Diagnosis not present

## 2024-07-21 DIAGNOSIS — L739 Follicular disorder, unspecified: Secondary | ICD-10-CM | POA: Diagnosis not present

## 2024-07-21 DIAGNOSIS — L821 Other seborrheic keratosis: Secondary | ICD-10-CM | POA: Diagnosis not present

## 2024-07-21 DIAGNOSIS — N401 Enlarged prostate with lower urinary tract symptoms: Secondary | ICD-10-CM | POA: Diagnosis not present

## 2024-07-21 DIAGNOSIS — E782 Mixed hyperlipidemia: Secondary | ICD-10-CM | POA: Diagnosis not present

## 2024-07-21 DIAGNOSIS — L814 Other melanin hyperpigmentation: Secondary | ICD-10-CM | POA: Diagnosis not present

## 2024-07-21 DIAGNOSIS — Z0000 Encounter for general adult medical examination without abnormal findings: Secondary | ICD-10-CM | POA: Diagnosis not present

## 2024-08-11 DIAGNOSIS — M9903 Segmental and somatic dysfunction of lumbar region: Secondary | ICD-10-CM | POA: Diagnosis not present

## 2024-08-11 DIAGNOSIS — M9901 Segmental and somatic dysfunction of cervical region: Secondary | ICD-10-CM | POA: Diagnosis not present

## 2024-08-11 DIAGNOSIS — M5416 Radiculopathy, lumbar region: Secondary | ICD-10-CM | POA: Diagnosis not present

## 2024-08-11 DIAGNOSIS — M9902 Segmental and somatic dysfunction of thoracic region: Secondary | ICD-10-CM | POA: Diagnosis not present

## 2024-08-11 DIAGNOSIS — M531 Cervicobrachial syndrome: Secondary | ICD-10-CM | POA: Diagnosis not present

## 2024-08-11 DIAGNOSIS — M5414 Radiculopathy, thoracic region: Secondary | ICD-10-CM | POA: Diagnosis not present

## 2024-09-15 DIAGNOSIS — M9903 Segmental and somatic dysfunction of lumbar region: Secondary | ICD-10-CM | POA: Diagnosis not present

## 2024-09-15 DIAGNOSIS — M9902 Segmental and somatic dysfunction of thoracic region: Secondary | ICD-10-CM | POA: Diagnosis not present

## 2024-09-15 DIAGNOSIS — M9901 Segmental and somatic dysfunction of cervical region: Secondary | ICD-10-CM | POA: Diagnosis not present

## 2024-09-15 DIAGNOSIS — M531 Cervicobrachial syndrome: Secondary | ICD-10-CM | POA: Diagnosis not present

## 2024-09-15 DIAGNOSIS — M5416 Radiculopathy, lumbar region: Secondary | ICD-10-CM | POA: Diagnosis not present

## 2024-09-15 DIAGNOSIS — M5414 Radiculopathy, thoracic region: Secondary | ICD-10-CM | POA: Diagnosis not present

## 2024-10-13 DIAGNOSIS — M5414 Radiculopathy, thoracic region: Secondary | ICD-10-CM | POA: Diagnosis not present

## 2024-10-13 DIAGNOSIS — M9903 Segmental and somatic dysfunction of lumbar region: Secondary | ICD-10-CM | POA: Diagnosis not present

## 2024-10-13 DIAGNOSIS — M531 Cervicobrachial syndrome: Secondary | ICD-10-CM | POA: Diagnosis not present

## 2024-10-13 DIAGNOSIS — M5416 Radiculopathy, lumbar region: Secondary | ICD-10-CM | POA: Diagnosis not present

## 2024-10-13 DIAGNOSIS — M9901 Segmental and somatic dysfunction of cervical region: Secondary | ICD-10-CM | POA: Diagnosis not present

## 2024-10-13 DIAGNOSIS — M9902 Segmental and somatic dysfunction of thoracic region: Secondary | ICD-10-CM | POA: Diagnosis not present

## 2024-11-03 DIAGNOSIS — L578 Other skin changes due to chronic exposure to nonionizing radiation: Secondary | ICD-10-CM | POA: Diagnosis not present

## 2024-11-03 DIAGNOSIS — L739 Follicular disorder, unspecified: Secondary | ICD-10-CM | POA: Diagnosis not present

## 2024-11-24 DIAGNOSIS — M5414 Radiculopathy, thoracic region: Secondary | ICD-10-CM | POA: Diagnosis not present

## 2024-11-24 DIAGNOSIS — M9901 Segmental and somatic dysfunction of cervical region: Secondary | ICD-10-CM | POA: Diagnosis not present

## 2024-11-24 DIAGNOSIS — M5416 Radiculopathy, lumbar region: Secondary | ICD-10-CM | POA: Diagnosis not present

## 2024-11-24 DIAGNOSIS — M9902 Segmental and somatic dysfunction of thoracic region: Secondary | ICD-10-CM | POA: Diagnosis not present

## 2024-11-24 DIAGNOSIS — M9903 Segmental and somatic dysfunction of lumbar region: Secondary | ICD-10-CM | POA: Diagnosis not present

## 2024-11-24 DIAGNOSIS — M531 Cervicobrachial syndrome: Secondary | ICD-10-CM | POA: Diagnosis not present

## 2025-01-05 ENCOUNTER — Ambulatory Visit: Admitting: Physician Assistant
